# Patient Record
Sex: Male | Born: 1940 | Race: Black or African American | Hispanic: No | Marital: Married | Smoking: Former smoker
Health system: Southern US, Community
[De-identification: ages and names within clinical notes are randomized; demographics above are authoritative.]

## PROBLEM LIST (undated history)

## (undated) DIAGNOSIS — E785 Hyperlipidemia, unspecified: Secondary | ICD-10-CM

## (undated) DIAGNOSIS — C61 Malignant neoplasm of prostate: Secondary | ICD-10-CM

## (undated) DIAGNOSIS — M1990 Unspecified osteoarthritis, unspecified site: Secondary | ICD-10-CM

## (undated) DIAGNOSIS — I714 Abdominal aortic aneurysm, without rupture, unspecified: Secondary | ICD-10-CM

## (undated) DIAGNOSIS — I219 Acute myocardial infarction, unspecified: Secondary | ICD-10-CM

## (undated) DIAGNOSIS — I2510 Atherosclerotic heart disease of native coronary artery without angina pectoris: Secondary | ICD-10-CM

## (undated) DIAGNOSIS — I10 Essential (primary) hypertension: Secondary | ICD-10-CM

## (undated) DIAGNOSIS — E039 Hypothyroidism, unspecified: Secondary | ICD-10-CM

## (undated) HISTORY — PX: ABDOMINAL EXPLORATION SURGERY: SHX538

## (undated) HISTORY — DX: Abdominal aortic aneurysm, without rupture, unspecified: I71.40

## (undated) HISTORY — DX: Essential (primary) hypertension: I10

## (undated) HISTORY — DX: Acute myocardial infarction, unspecified: I21.9

## (undated) HISTORY — DX: Abdominal aortic aneurysm, without rupture: I71.4

## (undated) HISTORY — DX: Malignant neoplasm of prostate: C61

## (undated) HISTORY — PX: HERNIA REPAIR: SHX51

## (undated) HISTORY — PX: CATARACT EXTRACTION W/ INTRAOCULAR LENS  IMPLANT, BILATERAL: SHX1307

## (undated) HISTORY — DX: Hypothyroidism, unspecified: E03.9

## (undated) HISTORY — DX: Hyperlipidemia, unspecified: E78.5

## (undated) HISTORY — PX: CORONARY ANGIOPLASTY: SHX604

---

## 1998-09-03 ENCOUNTER — Emergency Department (HOSPITAL_COMMUNITY): Admission: EM | Admit: 1998-09-03 | Discharge: 1998-09-03 | Payer: Self-pay | Admitting: Emergency Medicine

## 1999-11-22 ENCOUNTER — Inpatient Hospital Stay (HOSPITAL_COMMUNITY): Admission: EM | Admit: 1999-11-22 | Discharge: 1999-11-26 | Payer: Self-pay | Admitting: Emergency Medicine

## 1999-11-22 ENCOUNTER — Encounter: Payer: Self-pay | Admitting: Emergency Medicine

## 1999-11-24 ENCOUNTER — Encounter: Payer: Self-pay | Admitting: Cardiology

## 1999-11-29 ENCOUNTER — Encounter: Admission: RE | Admit: 1999-11-29 | Discharge: 1999-11-29 | Payer: Self-pay | Admitting: Family Medicine

## 1999-12-03 ENCOUNTER — Encounter (HOSPITAL_COMMUNITY): Admission: RE | Admit: 1999-12-03 | Discharge: 2000-03-02 | Payer: Self-pay | Admitting: Cardiology

## 1999-12-04 ENCOUNTER — Encounter: Admission: RE | Admit: 1999-12-04 | Discharge: 1999-12-04 | Payer: Self-pay | Admitting: Family Medicine

## 2000-03-06 ENCOUNTER — Encounter (HOSPITAL_COMMUNITY): Admission: RE | Admit: 2000-03-06 | Discharge: 2000-06-04 | Payer: Self-pay | Admitting: Cardiology

## 2000-05-08 ENCOUNTER — Encounter: Payer: Self-pay | Admitting: Cardiology

## 2000-05-08 ENCOUNTER — Ambulatory Visit (HOSPITAL_COMMUNITY): Admission: RE | Admit: 2000-05-08 | Discharge: 2000-05-08 | Payer: Self-pay | Admitting: Cardiology

## 2000-05-11 ENCOUNTER — Encounter: Admission: RE | Admit: 2000-05-11 | Discharge: 2000-05-11 | Payer: Self-pay | Admitting: Family Medicine

## 2000-05-12 ENCOUNTER — Ambulatory Visit (HOSPITAL_COMMUNITY): Admission: RE | Admit: 2000-05-12 | Discharge: 2000-05-13 | Payer: Self-pay | Admitting: Cardiology

## 2001-06-02 ENCOUNTER — Ambulatory Visit (HOSPITAL_COMMUNITY): Admission: RE | Admit: 2001-06-02 | Discharge: 2001-06-02 | Payer: Self-pay | Admitting: Gastroenterology

## 2001-06-02 ENCOUNTER — Encounter (INDEPENDENT_AMBULATORY_CARE_PROVIDER_SITE_OTHER): Payer: Self-pay | Admitting: *Deleted

## 2004-10-06 HISTORY — PX: COLON SURGERY: SHX602

## 2004-10-06 HISTORY — PX: COLONOSCOPY: SHX174

## 2004-12-09 ENCOUNTER — Encounter: Admission: RE | Admit: 2004-12-09 | Discharge: 2004-12-09 | Payer: Self-pay | Admitting: Internal Medicine

## 2004-12-18 ENCOUNTER — Encounter: Admission: RE | Admit: 2004-12-18 | Discharge: 2004-12-18 | Payer: Self-pay | Admitting: Internal Medicine

## 2005-06-02 ENCOUNTER — Ambulatory Visit (HOSPITAL_COMMUNITY): Admission: RE | Admit: 2005-06-02 | Discharge: 2005-06-02 | Payer: Self-pay | Admitting: Cardiology

## 2006-10-06 DIAGNOSIS — I219 Acute myocardial infarction, unspecified: Secondary | ICD-10-CM

## 2006-10-06 HISTORY — PX: CORONARY ANGIOPLASTY WITH STENT PLACEMENT: SHX49

## 2006-10-06 HISTORY — DX: Acute myocardial infarction, unspecified: I21.9

## 2007-11-05 ENCOUNTER — Encounter (INDEPENDENT_AMBULATORY_CARE_PROVIDER_SITE_OTHER): Payer: Self-pay | Admitting: General Surgery

## 2007-11-05 ENCOUNTER — Inpatient Hospital Stay (HOSPITAL_COMMUNITY): Admission: EM | Admit: 2007-11-05 | Discharge: 2007-11-16 | Payer: Self-pay | Admitting: Emergency Medicine

## 2008-01-04 ENCOUNTER — Ambulatory Visit (HOSPITAL_COMMUNITY): Admission: RE | Admit: 2008-01-04 | Discharge: 2008-01-04 | Payer: Self-pay | Admitting: General Surgery

## 2008-06-23 ENCOUNTER — Inpatient Hospital Stay (HOSPITAL_COMMUNITY): Admission: RE | Admit: 2008-06-23 | Discharge: 2008-06-30 | Payer: Self-pay | Admitting: General Surgery

## 2009-05-29 ENCOUNTER — Encounter: Admission: RE | Admit: 2009-05-29 | Discharge: 2009-05-29 | Payer: Self-pay | Admitting: Internal Medicine

## 2010-10-16 ENCOUNTER — Ambulatory Visit
Admission: RE | Admit: 2010-10-16 | Discharge: 2010-10-16 | Payer: Self-pay | Source: Home / Self Care | Attending: Pulmonary Disease | Admitting: Pulmonary Disease

## 2010-10-16 DIAGNOSIS — E785 Hyperlipidemia, unspecified: Secondary | ICD-10-CM | POA: Insufficient documentation

## 2010-10-16 DIAGNOSIS — I10 Essential (primary) hypertension: Secondary | ICD-10-CM | POA: Insufficient documentation

## 2010-10-16 DIAGNOSIS — G4710 Hypersomnia, unspecified: Secondary | ICD-10-CM | POA: Insufficient documentation

## 2010-10-16 DIAGNOSIS — I219 Acute myocardial infarction, unspecified: Secondary | ICD-10-CM | POA: Insufficient documentation

## 2010-11-07 NOTE — Assessment & Plan Note (Signed)
Summary: SLEEP CONSULT /SH   Primary Provider/Referring Provider:  Dr. Rinaldo Cloud  CC:  Sleep evaluation...epworth score is 4.  History of Present Illness: 70 yo male for sleep evaluation.  His wife has sleep apnea.  She is concerned he may have sleep apnea aslo.  He snores, and she sees him stop breathing while asleep.  He will also wake up with a cough, and more so when on his back.  This has been present for some time.  He goes to bed at 11pm, and falls asleep quick.  He does not use anything to help him sleep.  He wakes up several times to use the bathroom.  He gets up at 8am and feels okay.  He denies morning headache.  He drinks 2 cups of coffee in the morning.  He works out at [employer] for up to 2 hours per day.  He denies sleep walking, sleep talking, nightmares, or bruxism.  There is no history of restless legs, but he does occasional get cramps.  He denies sleep hallucinations, sleep paralysis, or cataplexy.  He quit smoking years ago.  He does not drink much alcohol.  He has not had any trouble with his mood.  He has gained about 15 lbs.    Preventive Screening-Counseling & Management  Alcohol-Tobacco     Smoking Status: quit     Packs/Day: 1.0     Year Started: 1971     Year Quit: 2001  Current Medications (verified): 1)  Metoprolol Succinate 100 Mg Xr24h-Tab (Metoprolol Succinate) .Marland Kitchen.. 1 By Mouth Daily 2)  Levothyroxine Sodium 50 Mcg Tabs (Levothyroxine Sodium) .Marland Kitchen.. 1 By Mouth Daily 3)  Losartan Potassium 100 Mg Tabs (Losartan Potassium) .Marland Kitchen.. 1 By Mouth Daily 4)  Simvastatin 80 Mg Tabs (Simvastatin) .Marland Kitchen.. 1 By Mouth At Bedtime 5)  Hydrochlorothiazide 25 Mg Tabs (Hydrochlorothiazide) .Marland Kitchen.. 1 By Mouth Daily 6)  Amlodipine Besylate 5 Mg Tabs (Amlodipine Besylate) .Marland Kitchen.. 1 By Mouth Daily  Allergies (verified): No Known Drug Allergies  Past History:  Past Medical History: CAD s/p MI s/p stenting Hypertension Hyperlipidemia Hypothyroidism  Past Surgical  History: Aortic insuff-mod -, Cardiac Cath/PTCA stent LAD--EF 38% - 06/06/2000, multiple PTCA`s `97-`01 -  colon repair after colonoscopy in 2008 heart stent in2000  Family History: Reviewed history and no changes required. Family History MI/Heart Attack---sister Heart disease---2 brothers  Social History: Reviewed history from 12/03/2006 and no changes required. Married, h/o tob abuse, quit 01/01 Patient states former smoker.  retired truck driverSmoking Status:  quit Packs/Day:  1.0  Review of Systems       The patient complains of shortness of breath with activity, non-productive cough, and hand/feet swelling.  The patient denies shortness of breath at rest, productive cough, coughing up blood, chest pain, irregular heartbeats, acid heartburn, indigestion, loss of appetite, weight change, abdominal pain, difficulty swallowing, sore throat, tooth/dental problems, headaches, nasal congestion/difficulty breathing through nose, sneezing, itching, ear ache, anxiety, depression, joint stiffness or pain, rash, change in color of mucus, and fever.    Vital Signs:  Patient profile:   70 year old male Height:      66 inches (167.64 cm) Weight:      231 pounds (105.00 kg) BMI:     37.42 O2 Sat:      94 % on Room air Temp:     97.8 degrees F (36.56 degrees C) oral Pulse rate:   86 / minute BP sitting:   138 / 72  (left arm) Cuff size:  large  Vitals Entered By: Michel Bickers CMA (October 16, 2010 9:19 AM)  O2 Sat at Rest %:  94 O2 Flow:  Room air CC: Sleep evaluation...epworth score is 4 Is Patient Diabetic? No Comments Medications reviewed with patient Michel Bickers CMA  October 16, 2010 9:20 AM   Physical Exam  General:  normal appearance, healthy appearing, and obese.   Eyes:  PERRLA and EOMI, wears glasses Nose:  no deformity, discharge, inflammation, or lesions Mouth:  MP 3, wears dentures Neck:  no masses, thyromegaly, or abnormal cervical nodes Chest Wall:  no deformities  noted Lungs:  clear bilaterally to auscultation and percussion Heart:  regular rate and rhythm, S1, S2 without murmurs, rubs, gallops, or clicks Abdomen:  bowel sounds positive; abdomen soft and non-tender without masses, or organomegaly Msk:  no deformity or scoliosis noted with normal posture Pulses:  pulses normal Extremities:  no clubbing, cyanosis, edema, or deformity noted Neurologic:  normal CN II-XII and strength normal.   Cervical Nodes:  no significant adenopathy Psych:  alert and cooperative; normal mood and affect; normal attention span and concentration   Impression & Recommendations:  Problem # 1:  HYPERSOMNIA (ICD-780.54) He has sleep disruption and witnessed apnea.  He has history of cardiovascular disease.  I am concerned that he has sleep apnea. To further assess will schedule sleep test.  Discussed how sleep apnea can affect his health.  Driving precautions and importance or weight loss discussed.  Medications Added to Medication List This Visit: 1)  Metoprolol Succinate 100 Mg Xr24h-tab (Metoprolol succinate) .Marland Kitchen.. 1 by mouth daily 2)  Levothyroxine Sodium 50 Mcg Tabs (Levothyroxine sodium) .Marland Kitchen.. 1 by mouth daily 3)  Losartan Potassium 100 Mg Tabs (Losartan potassium) .Marland Kitchen.. 1 by mouth daily 4)  Simvastatin 80 Mg Tabs (Simvastatin) .Marland Kitchen.. 1 by mouth at bedtime 5)  Hydrochlorothiazide 25 Mg Tabs (Hydrochlorothiazide) .Marland Kitchen.. 1 by mouth daily 6)  Amlodipine Besylate 5 Mg Tabs (Amlodipine besylate) .Marland Kitchen.. 1 by mouth daily 7)  Aspirin 81 Mg Chew (Aspirin) .... One once daily  Complete Medication List: 1)  Metoprolol Succinate 100 Mg Xr24h-tab (Metoprolol succinate) .Marland Kitchen.. 1 by mouth daily 2)  Levothyroxine Sodium 50 Mcg Tabs (Levothyroxine sodium) .Marland Kitchen.. 1 by mouth daily 3)  Losartan Potassium 100 Mg Tabs (Losartan potassium) .Marland Kitchen.. 1 by mouth daily 4)  Simvastatin 80 Mg Tabs (Simvastatin) .Marland Kitchen.. 1 by mouth at bedtime 5)  Hydrochlorothiazide 25 Mg Tabs (Hydrochlorothiazide) .Marland Kitchen.. 1 by  mouth daily 6)  Amlodipine Besylate 5 Mg Tabs (Amlodipine besylate) .Marland Kitchen.. 1 by mouth daily 7)  Aspirin 81 Mg Chew (Aspirin) .... One once daily  Other Orders: Consultation Level IV (45409) Sleep Study (Sleep Study)  Patient Instructions: 1)  Will schedule sleep test 2)  Will call to schedule follow up after sleep test reviewed

## 2011-02-18 NOTE — Op Note (Signed)
Bryan Carter, Bryan Carter                ACCOUNT NO.:  [id number]   MEDICAL RECORD NO.:  [id number]          PATIENT TYPE:  INP   LOCATION:  3305                         FACILITY:  [facility]   PHYSICIAN:  Ollen Gross. Vernell Morgans, M.D. DATE OF BIRTH:  07/20/1941   DATE OF PROCEDURE:  11/08/2007  DATE OF DISCHARGE:                               OPERATIVE REPORT   PREOPERATIVE DIAGNOSIS:  Colon perforation from colonoscopy.   POSTOPERATIVE DIAGNOSIS:  Colon perforation from colonoscopy.   PROCEDURE:  Exploratory laparotomy, low anterior resection, repair of  serosal tear of the left colon, lysis of adhesions.   SURGEON:  Ollen Gross. Vernell Morgans, M.D.   ASSISTANT:  Dr. Janee Morn.   ANESTHESIA:  General endotracheal.   PROCEDURE:  After informed consent was obtained, the patient was brought  to the operating room and placed in the supine position on the operating  room table.  After adequate induction of general anesthesia, the  patient's abdomen was prepped with Betadine and draped in usual sterile  manner.  The patient also was placed in lithotomy position.  A midline  incision was made with 10 blade knife.  This incision was carried down  through the skin and subcutaneous tissue sharply with electrocautery  until the linea alba was identified.  The linea alba was incised with  the electrocautery.  The preperitoneal space then probed bluntly with  hemostat until the peritoneum was opened and access was gained to the  abdominal cavity.  The rest of the incision was opened under direct  vision with the electrocautery.  There was a large amount of free intra-  abdominal air.  The patient was placed in Trendelenburg position, his  small bowel was packed in the upper abdomen.  A Balfour retractor was  deployed.  The sigmoid and left colon were examined, the patient had a  large tear of his rectosigmoid right above the peritoneal reflection.  Going more proximal up the sigmoid and left colon, there were  several  other areas of serosal tear.  The sigmoid and left colon also had some  unusual adhesions of the appendages to the mesentery and retroperitoneum  that tethered the bowel down and made it very nonmobile, which probably  contributed to the perforation.  These adhesions of the appendages to  the mesentery and retroperitoneum were divided sharply with the  electrocautery.  This allowed [place] to free up the left colon.  The left  colon was also mobilized by incising its retroperitoneal attachment  along the white line of Toldt.  We were then able to examine the left  and sigmoid colon much better.  There were several serosal tears just  above the large full-thickness tear low on the sigmoid, rectosigmoid  area.  A site was chosen above these areas of tear for division of the  colon.  The mesentery at this point was opened sharply with the  electrocautery.  The GIA 75 stapler was placed across the colon at this  point, clamped and fired thereby dividing the colon between staple  lines.  The mesentery to the rectosigmoid was taken  down with the  LigaSure until we were just below the level of the tear.  The  rectosigmoid was then divided with a blue contour stapler just below the  level of the tear.  Once this was accomplished, the area of tear was  removed and sent to pathology for further evaluation.  More proximal on  the left colon, there was one smaller area of serosal tear with no  perforation and this was repaired with interrupted 3-0 silk stitches.  The rest of colon was examined and no other areas of tear or damage were  identified.  The sigmoid colon would easily reach down to the rectal  stump.  Since the colon was already prepped and was minimal  contamination, we decided to go ahead and make our primary anastomosis.  Because of the low nature of this, we decided EEA stapled anastomosis  will be best.  The staple line of the proximal colon was excised sharply  with the  electrocautery.  EEA sizers were used and a 29 mm sizer  appeared to be the appropriate size.  A 2-0 Prolene pursestring stitch  was then placed around the edge of the proximal colon.  This was made in  a running fashion.  The anvil of the stapler was then placed inside the  proximal colon and the Prolene pursestring stitch with cinched down snug  around the stem of the anvil.  At this point, Dr. Janee Morn went down to  the rectum and was able to bring the EEA stapler up to the staple line.  The spike was then deployed just anterior to the staple line.  The anvil  was able to be attached to the spike without difficulty, making sure  that there were no twists to the colon.  The stapling device was then  closed until the indicator was in the middle of the green section and  then fired, thereby creating a nice patent anastomosis.  The stapling  device was then gently opened and removed without difficulty.  The  proximal colon was clamped with curved soft bowel clamp.  A rigid  sigmoidoscope was placed just inside the rectum and the rectum was  insufflated with saline in the pelvis and no evidence of leak or air  bubbles could be appreciated, the anastomosis looked good.  The air was  then evacuated.  The abdomen was irrigated with copious amounts of  saline.  The fascia of the anterior abdominal wall was then closed at  this point with the two running #1 looped PDS sutures.  The subcutaneous  tissue was irrigated with copious amounts of saline and Betadine and the  skin was closed with staples.  Sterile dressings were applied.  The  patient tolerated the procedure well.  At the end of the case, all  needle, sponge and instrument counts were correct.  The patient was then  awakened and taken to recovery in stable condition.      Ollen Gross. Vernell Morgans, M.D.  Electronically Signed     PST/MEDQ  D:  11/08/2007  T:  11/08/2007  Job:  [id number]

## 2011-02-18 NOTE — Discharge Summary (Signed)
Bryan Carter, Bryan Carter                ACCOUNT NO.:  [id number]   MEDICAL RECORD NO.:  [id number]          PATIENT TYPE:  INP   LOCATION:  2039                         FACILITY:  [facility]   PHYSICIAN:  Maisie Fus A. Cornett, M.D.DATE OF BIRTH:  [birth year]   DATE OF ADMISSION:  11/05/2007  DATE OF DISCHARGE:  11/16/2007                               DISCHARGE SUMMARY   ADMITTING DIAGNOSES:  1. Perforated colon status post colonoscopy.  2. Gastroesophageal reflux disease.  3. History of hypothyroidism.  4. Dyslipidemia.  5. Hypertension.  6. Alcohol use.  7. Colon polyps.  8. Anxiety.   DISCHARGE DIAGNOSES:  1. Perforated colon status post colonoscopy.  2. Gastroesophageal reflux disease.  3. History of hypothyroidism.  4. Dyslipidemia.  5. Hypertension.  6. Alcohol use.  7. Colon polyps.  8. Anxiety.   PROCEDURE PERFORMED:  Exploratory laparotomy with low anterior resection  secondary to colonic perforation and repair of serosal tear of left  colon with lysis of adhesions by Dr. Carolynne Edouard.   BRIEF HISTORY:  The patient is a 70 year old male who underwent a  colonoscopy on November 05, 2007, by Dr. Loreta Ave.  He developed abdominal  pain and distention and was found to have a perforated viscus and  brought to [facility] for emergency surgery.   HOSPITAL COURSE:  The patient underwent successful procedure on November 08, 2007.  Please see the op note for the details.  Postop course was  complicated by an ileus.  This slowly resolved over the next 7 days.  He  began to pass flatus and his bowel function returned.  He was having  bowel movements by postoperative day #8.  His diet was advanced.  His  pain was controlled.  Staples were removed on postop day #8.  The wound  was clean, dry and intact.  The abdomen was soft, obese.  He is having  no other issues at this point in time.  He was discharged home in  improved condition.   DISCHARGE INSTRUCTIONS:  He will follow up in 2-3 weeks with Dr.  Carolynne Edouard.  He will be on a soft diet.  He will refrain from driving, lifting,  pushing, pulling for 2-3 weeks.  He will take his home medicines as  listed on his medical reconciliation sheet which include the following:   DISCHARGE MEDICATIONS:  1. Norvasc 5 mg daily.  2. Toprol XL 50 mg daily.  3. Synthroid 50 mg daily.  4. Avalide 300/12.5 daily.  5. Aspirin 81 mg daily.  6. Simvastatin 80 mg daily.  7. Protonix 40 mg daily.  8. Vicodin ES 1-2 tablets q.4 h. p.r.n. pain.   FOLLOWUP:  He will follow up with Dr. Carolynne Edouard in 2-3 weeks.  He will  shower.   CONDITION AT DISCHARGE:  Improved.      [name], M.D.  Electronically Signed     TAC/MEDQ  D:  11/16/2007  T:  11/17/2007  Job:  [id number]   cc:   Anselmo Rod, M.D.
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
## 2011-02-18 NOTE — Op Note (Signed)
NAMEKAVI, ALMQUIST                ACCOUNT NO.:  [id number]   MEDICAL RECORD NO.:  [id number]          PATIENT TYPE:  OIB   LOCATION:  5151                         FACILITY:  [facility]   PHYSICIAN:  Ollen Gross. Vernell Morgans, M.D. DATE OF BIRTH:  06-25-1941   DATE OF PROCEDURE:  06/23/2008  DATE OF DISCHARGE:                               OPERATIVE REPORT   PREOPERATIVE DIAGNOSIS:  Ventral hernia.   POSTOPERATIVE DIAGNOSIS:  Ventral hernia.   PROCEDURE:  Ventral hernia repair with mesh and lysis of adhesions.   SURGEON:  Ollen Gross. Vernell Morgans, MD   ASSISTANT:  Juanetta Gosling, MD   ANESTHESIA:  General endotracheal.   PROCEDURE:  After informed consent was obtained, the patient was brought  to the operating room and placed in supine position on the operating  table.  After adequate induction of general anesthesia, the patient's  abdomen was prepped with Betadine and draped in usual sterile manner.  A  midline incision was made with a 10 blade knife.  This incision was  carried down through the skin and subcutaneous tissue sharply with the  electrocautery.  The hernia sac was then able to be identified.  The  hernia sac was opened sharply with the electrocautery and the rest of  the incision was then opened under direct vision all with  electrocautery.  There were some omental adhesions to the anterior  abdominal wall and these were taken down sharply with the electrocautery  until the abdominal wall was completely free.  Bowel appeared to be  healthy.  Next, the fascia was identified circumferentially around the  defect.  The fascia was grasped with Kocher clamps and then subcutaneous  flaps were created on top of the anterior abdominal wall fascia.  This  was all done circumferentially and sharply with electrocautery to  mobilize the fascia towards the midline.  Once this was accomplished, we  then measured the hernia defect which measured about 10 x 17.  We chose  the 20 x 25 piece of  Proceed mesh.  We anchored the mesh intra-  abdominally with the mesh oriented with the blue side towards the skin  with interrupted #1 Novafil stitches that were brought through the  abdominal wall, then through the mesh, and then back through the  abdominal wall in a U-type stitch.  This was done all the way around the  edge of the fascia making sure there were no gaps.  Once these stitches  were all placed circumferentially around the hernia defect, all of the  stitches were held up and care was taken to make sure no bowel or  omentum was caught within the stitches.  All the stitches were then held  under tension.  Once we were confirmed that the mesh was free, each  stitch was tied down and divided.  Once this was accomplished, the mesh  was in very good position without any redundancy.  The wound was then  irrigated with copious amounts of saline.  The fascia of the anterior  abdominal wall was then closed with 2 running #1 looped double-stranded  PDS sutures.  The subcutaneous tissue was irrigated with copious amounts  of saline.  A small stab incision was made in the right lower quadrant.  A hemostat was placed through this incision into the subcutaneous space.  A 19-French, round, Blake drain was then brought into the wound and  placed along the anterior abdominal wall fascia.  This drain was  anchored to the skin with a 3-0 nylon stitch, so the hernia sac near the  skin was then excised sharply with electrocautery as well as some of the  skin scar.  The subcutaneous tissue was then closed with running 2-0  Vicryl stitch and the skin was closed  with staples.  The drain was placed to bulb suction.  Sterile dressings  were applied.  The patient tolerated the procedure well.  At the end of  the case, all needle, sponge, and instrument counts were correct.  The  patient was then awakened and taken to recovery room in stable  condition.      Ollen Gross. Vernell Morgans, M.D.  Electronically  Signed     PST/MEDQ  D:  06/23/2008  T:  06/23/2008  Job:  [id number]
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
## 2011-02-21 NOTE — Discharge Summary (Signed)
Bryan Carter, Bryan Carter                ACCOUNT NO.:  [id number]   MEDICAL RECORD NO.:  [id number]          PATIENT TYPE:  INP   LOCATION:  5155                         FACILITY:  [facility]   PHYSICIAN:  Ollen Gross. Vernell Morgans, M.D. DATE OF BIRTH:  [birth year]   DATE OF ADMISSION:  06/23/2008  DATE OF DISCHARGE:  06/30/2008                               DISCHARGE SUMMARY   HISTORY AND HOSPITAL COURSE:  [name] is a 70 year old gentleman who  was brought to the operating room on June 23, 2008 for ventral  hernia repair with mesh.  He tolerated the surgery well.  A JP drain was  left in the subcutaneous tissue postoperatively.  He was continued on  clear liquids postoperatively until his bowel function returned.  He had  some difficulty breathing on postop day #1 and a chest x-ray was  obtained but was fairly unremarkable.  On postop day #3, Reglan was  added for postop ileus and albuterol nebs were added for some wheezing.  His abdomen was still distended and quiet.  He received a Dulcolax  suppository on postop day #4.  His pulmonary function improved on postop  day #4.  On postop day #5, we were able to advance his diet and on  June 30, 2008, he was tolerating a diet.  Bowel function had  returned and he was doing well and was ready for discharge home.   MEDICATIONS:  He was to resume his home meds.  He was given a  prescription for pain medicine.   ACTIVITIES:  No heavy lifting.   DIET:  As tolerated.   FINAL DIAGNOSIS:  Ventral hernia repair with mesh.   FOLLOWUP:  With Dr. Carolynne Edouard in a week.   DISPOSITION:  He is discharged home.      Ollen Gross. Vernell Morgans, M.D.  Electronically Signed     PST/MEDQ  D:  08/14/2008  T:  08/15/2008  Job:  [id number]

## 2011-02-21 NOTE — Cardiovascular Report (Signed)
[place]. [facility]  Patient:    Bryan Carter, Bryan Carter                       MRN: [id number] Proc. Date: 05/12/00 Adm. Date:  [id number] Disc. Date: [id number] Attending:  Robynn Pane CC:         Cath lab  [facility]. Sharyn Lull, M.D.   Cardiac Catheterization  PREOPERATIVE DIAGNOSIS:  POSTOPERATIVE DIAGNOSIS:  OPERATION/PROCEDURE:          1. Left cardiac catheterization with selective left and right coronary angiography, left ventriculography right groin using Judkins technique.                                2. Successful PTCA to proximal LAD using 3.0 x 10 mm long cutting balloon.  ANESTHESIA:                   Xylocaine 2% local anesthesia.  SURGEON:                      Eduardo Osier. Sharyn Lull, M.D.  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  He complained of retrosternal chest burning off and on lasting a few minutes associated with minimal exertion and relieved with rest. The patient states he feels weak and tired, denies any nausea or vomiting, or diaphoresis.  No rest or nocturnal angina.  The patient underwent stress Cardiolite on May 08, 2000, which showed marked reversible ischemia involving the anterior wall and inferior wall and apex, less so in the septum with superimposed scarring of the apex with ejection fraction of 36%.  Due to recurrent chest pain and positive stress Cardiolite suggestive of a large area of myocardium at risk, the patient was advised of left possible angioplasty.  DESCRIPTION OF PROCEDURE:     After obtaining the informed consent, the patient was brought to the catheterization lab and was placed on the fluoroscopic table. The right groin was prepped and draped in the  usual fashion.  Then 2% Xylocaine was used for local anesthesia in the right groin. The ___________ thin wall needle and 6 French arterial sheath was placed. The sheath was aspirated and flushed.  Next, a 6 French left Judkins catheter was advanced over the wire under fluoroscopic guidance up to the ascending aorta. Wire was pulled out. The catheter was aspirated and connected to the manifold.  The catheter was further advanced and engaged into the left coronary ostium where full views of the left system were done.  Next, the catheter was disengaged and was pulled out over the wire and was replaced with 6 French right Judkins catheter which was advanced over the wire under fluoroscopic guidance to the ascending aorta.  Wire was pulled out. The catheter was aspirated and connected to the manifold.  The catheter was further advanced and engaged into right coronary ostium and full views of the right system were taken.  Next, catheter was disengaged and was pulled out over the wire and was replaced with 6 French pigtail catheter which was advanced over the wire under fluoroscopic guidance up to the ascending aorta. Wire was pulled out.  The catheter  was aspirated and connected to the manifold.  Catheter was further advanced across the aortic valve into the LV. LV pressures were recorded.  Next, left ventriculography was done in the ROA position.  Post angiography pressures were recorded from LV and then pullback pressures were recorded from the aorta.  There was no gradient across the aortic valve.  Next, the pigtail catheter was pulled out.  The sheaths were aspirated and flushed.  FINDINGS:                     The patient has anterior apical wall hypokinesia with EF of 40 to 45%, left main was short which was patent, LAD has 99% proximal in-stent restenosis, left circumflex has 15 to 20% proximal stenosis which is a small vessel and tapers down into the AV groove.  The OM1 is medium size  which has 15 to 20% proximal stenosis.  The OM2 and OM3 are very small, which are patent.  Dara Lords has 10 to 15% mid stenosis.  PDA has 70 to 75% stenosis; vessel is small and is less than 1.5 mm in size.  INTERVENTIONAL PROCEDURE:     Successful PTCA to proximal LAD was done using 3.0 x 10 mm long cutting balloon.  Multiple inflations were done.  Lesion dilated from 99% to less than 10% ostial with excellent grade III distal flow without evidence of dissection or distal embolization.  The patient received __________ heparin, ReoPro, and Plavix during the procedure.  The patient tolerated the procedure well.  There were no complications.  The patient was transferred to __________________ unit in stable condition. DD:  05/13/00 TD:  05/14/00 Job: [id number]

## 2011-02-21 NOTE — Cardiovascular Report (Signed)
[place]. [facility]  Patient:    JASMAN, MURRI                       MRN: [id number] Proc. Date: 11/25/99 Adm. Date:  [id number] Disc. Date: [id number] Attending:  Asencion Partridge L CC:         Cardiac Catheterization Laboratory                        Cardiac Catheterization  PROCEDURES: 1. Left cardiac catheterization with selective right and left coronary angiography,    left ventriculography via right groin using Judkins technique. 2. Successful percutaneous transluminal coronary angioplasty to proximal left    anterior descending using 2.5 x 15 mm long CrossSail balloon. 3. Successful deployment of 3.0 x 12 mm long AVE S670 stent in proximal    left anterior descending at the bifurcation with diagonal #1.  INDICATIONS FOR PROCEDURE:  Mr. Ambroise is a 70 year old black male with a past medical history significant for hypertension, tobacco abuse, positive family history of coronary artery disease.  He came to the ER complaining of retrosternal chest pressure radiating to the back associated with diaphoresis and mild shortness of breath and grade 10/10 while he was in shop.  He went home and took two aspirin with partial relief.  He came to the ER, refused sublingual nitroglycerin and was started on heparin and nitrates with relief of chest pain.  Denies palpitation,  lightheadedness, syncope.  Denies PND, orthopnea, leg swelling.  Denies fever, chills, cough and denies relation of chest pain to movement or breathing. Denies any sort exertional angina in the past.  ECG done in the ER showed normal sinus rhythm with LVH with occasional PVCs, Q and aVL with minimal ST elevation in aVL.  Poor R wave progression in V1 to V3 with minimal ST elevation in V1 to V3. His first set of cardiac enzymes was elevated.  CPK was 295, MB of 10.3. Troponin was also slightly elevated at 0.2.  Presently, the patient is pain-free and on V nitrates, heparin, aspirin, and  beta blockers.  The next day on February 17, the patients enzymes were further elevated.  His CPK was 450, MB of 50.1 with relative index of 11.1 and third set CPK was 447, MB of 43.5, and relative index of 9.7.  Troponin I second set was 2.25 and third set was 1.59.  ECG also showed new changes suggestive of an evolving anteroseptal wall MI. Due to multiple risks factors, positive cardiac enzymes and new ECG changes, the patient was advised for possible angioplasty.  DESCRIPTION OF PROCEDURE:  After obtaining informed consent, the patient was brought to the catheterization lab and was placed on the fluoroscopy table. The right groin was prepped and draped in the usual fashion.  Xylocaine 2% was used for local anesthesia in the right groin.  With the help of a thin-walled needle, a  [place] arterial sheath were placed.  The sheath was aspirated and flushed. Next, a straight left Judkins catheter was advanced over the wire under fluoroscopic guidance up to the ascending aorta.  The wire was pulled out.  The catheter was  aspirated and connected to the manifold.  The catheter was further advanced and  engaged into the left coronary ostium.  Multiple views of the left system were taken.  Next, the catheter was disengaged and was pulled out over the wire and as replaced with a 6 [place] right  Judkins catheter, which was advanced over the wire under fluoroscopic guidance up the ascending aorta.  The wire was pulled out. he catheter was aspirated and connected to the manifold.  The catheter was further  advanced and engaged into the right coronary ostium.  Multiple views of the right system were taken.  Next, the catheter was disengaged and was pulled out over the wire and was replaced with a 6 French pigtail catheter which was advanced over he wire under fluoroscopic guidance up to the ascending aorta.  The wire was pulled out.  The catheter was aspirated and connected to the  manifold.  The catheter was further advanced across the aortic valve into the LV.  LV pressures were recorded. Next, LV graft was done in 30 degree RAO position.  Post angiographic pressures  were recorded from LV and then pullback pressures were recorded from the aorta.  There was no gradient across the aortic valve.  The pigtail catheter was pulled out over the wire.  Sheaths were aspirated and flushed.  FINDINGS:  LEFT VENTRICULOGRAM:  The patient has mild anterolateral wall hypokinesia.  EF f approximately 45-50%.  The left main was short but was patent.  The LAD has 95% proximal stenosis with haziness at the bifurcation of the diagonal #1.  Diagonal #1 is very small, 1.25 to 1.5 mm.  The left circumflex is patent and tapers down in the AV groove after OM-2. OM-1 is moderate sized and is patent.  OM-2 is small and is patent.  The RCA is patent.  INTERVENTIONAL PROCEDURE:  Successful PTCA to proximal the LAD was done using a  2.5 x 15 mm long CrossSail balloon using double wire technique to protect diagonal #1 and then for predilatation and then 3.0 x 12 mm long AVE S670 stent was deployed at 8 atmospheres of pressure which was fully expanded going up to atmospheres pressure.  The lesion was dilated from 95% to 0% ostial with excellent TIMI-3 distal flow without evidence of dissection of disembolization.  The patient tolerated the procedure well.  There were no complications.  The arteriotomy was closed with Perclose after the procedure without complications.  The patient received weight based heparin, ReoPro, and Plavix during the procedure.  The patient was transferred to the recovery room in stable condition for hemodynamic monitoring and anticoagulation. DD:  11/25/99 TD:  11/25/99 Job: [id number]

## 2011-02-21 NOTE — Discharge Summary (Signed)
[place]. [facility]  Patient:    Bryan Carter                       MRN: [id number] Adm. Date:  [id number] Disc. Date: [id number] Attending:  Robynn Pane CC:         Eduardo Osier. Sharyn Lull, M.D.   Discharge Summary  ADMISSION DIAGNOSES: 1. Unstable angina, positive stress Cardiolite, status post percutaneous    transluminal coronary angioplasty to in-stent restenosis to proximal left    anterior descending. 2. Hypertension. 3. History of anteroseptal wall myocardial infarction. 4. Hypercholesterolemia. 5. History of tobacco abuse.  DISCHARGE MEDICATIONS: 1. Toprol XL 50 mg 1 tablet twice daily. 2. Altace 10 mg 1 capsule twice daily. 3. Zocor 40 mg 1 tablet p.o. h.s. 4. Enteric-coated aspirin 1 tablet daily. 5. Plavix 75 mg 1 tablet daily with food for one month. 6. Nexium 40 mg 1 capsule daily for one month. 7. Norvasc 5 mg 1 tablet daily. 8. Nitrostat 0.4 mg sublingual use as directed.  ACTIVITY:  Avoid heavy lifting, pushing, or pulling.  DIET:  Low salt/low cholesterol diet.  DISCHARGE INSTRUCTIONS:  Post angioplasty instructions have been given.  FOLLOW-UP:  Follow up with me in seven days.  CONDITION ON DISCHARGE:  Stable.  BRIEF HISTORY:  Mr. Bryan Carter is a 70 year old black male with a past medical history significant for anteroseptal wall MI in February 2001, status post PTCA stenting to proximal LAD, history of hypertension, tobacco abuse, post family history of coronary artery disease, with complaints of retrosternal burning off and on lasting a few minutes associated with minimal exertion and relieved with rest.  He states he feels weak and tired.  He denies any nausea, vomiting, or diaphoresis.  No retrosternal angina.  The patient underwent stress Cardiolite on May 08, 2000, which showed marked reversible ischemia involving the anterior and inferior wall and apex; less so the septum with superimposed _______ of the apex with  EF of 36%.  Due to recurrent chest pain and positive stress Cardiolite with history of large area area of myocardial ischemia, the patient was advised for catheterization and possible angioplasty.  PAST MEDICAL HISTORY:  As above.  PAST SURGICAL HISTORY:  None.  SOCIAL HISTORY:  He is married and has three children.  He worked as a [employer].  He smoked one pack per day for 20+ years.  He quit approximately six months ago.  No history of alcohol or cocaine abuse.  The patient was born and lives in [place].  FAMILY HISTORY:  Father died at the age of 76 and mother died at the age of 2, cause unknown.  One brother had a cerebrovascular accident.  HOME MEDICATIONS 1. Altace 10 mg p.o. b.i.d. 2. Toprol XL 100 mg a.m. and 50 mg p.o. q.p.m. 3. Zocor 40 mg p.o. q.d. 4. Enteric-coated aspirin 325 mg p.o. q.d. 5. Norvasc 10 mg p.o. q.d. 6. Nitrostat sublingual p.r.n.  PHYSICAL EXAMINATION:  GENERAL:  He was alert, awake, and oriented x 3 in no acute distress.  VITAL SIGNS:  Blood pressure 130/80, pulse 64 and regular.  HEENT:  Conjunctiva was pink.  NECK:  Supple.  No JVD, no bruits.  LUNGS:  Clear to auscultation without rhonchi or rales.  CARDIOVASCULAR:  S1, S2 was normal.  There was no S3 or gallop.  There was no murmur.  ABDOMEN:  Soft.  Bowel sounds were present.  Nontender.  EXTREMITIES:  No clubbing, cyanosis,  or edema.  HOSPITAL COURSE:  The patient was admitted and underwent left cardiac catheterization with elective left and right coronary angiography and PTCA to in-stent restenosis in the proximal LAD, as per catheterization and interventional reports.  The patient tolerated the procedure well.  There were no complications.  The patient was transferred to elective angioplasty unit in stable condition.  His right femoral arterial sheath was pulled out yesterday without any problems.  There is no evidence of hematoma or bruits.  The patient has been  ambulating in the hallway without any problems.  Phase I cardiac rehab was called.  The patient ambulated in the hallway this morning without any problems.  The patient feels already stronger and no evidence of chest burning.  The patient has been also scheduled for Phase 2 cardiac rehab as an outpatient.  The patient will be discharged on the above medications and will be followed up in my office in one week.  The patient has been advised for nitroglycerin use, angina, calling 911, risk factors, and exercise guidelines.  The patient understands and states he has not been exercising or eating proper food but he will try to stick to a low cholesterol and low salt diet. DD:  05/13/00 TD:  05/14/00 Job: [id number]

## 2011-02-21 NOTE — Procedures (Signed)
[place]. [facility]  Patient:    Bryan Carter, Bryan Carter Visit Number: [id number] MRN: [id number]          Service Type: Attending:  Anselmo Rod, M.D. Proc. Date: 06/02/01   CC:         Kern Reap, M.D.   Procedure Report  DATE OF BIRTH:  October 14, 1940  REFERRING PHYSICIAN:  Kern Reap, M.D.  PROCEDURE PERFORMED:  Colonoscopy with hot biopsies.  ENDOSCOPIST:  Anselmo Rod, M.D.  INSTRUMENT USED:  Olympus video colonoscope.  INDICATIONS FOR PROCEDURE:  The patient is a 70 year old African-American male with a history of rectal bleeding, rule out colonic polyps, masses, hemorrhoids, etc.  PREPROCEDURE PREPARATION:  Informed consent was procured from the patient. The patient was fasted for eight hours prior to the procedure and prepped with a bottle of magnesium citrate and a gallon of NuLytely the night prior to the procedure.  PREPROCEDURE PHYSICAL:  The patient had stable vital signs.  Neck supple. Chest clear to auscultation.  S1, S2 regular.  Abdomen soft with normal abdominal bowel sounds.  DESCRIPTION OF PROCEDURE:  The patient was placed in the left lateral decubitus position and sedated with 60 mg of Demerol and 6 mg of Versed intravenously.  Once the patient was adequately sedated and maintained on low-flow oxygen and continuous cardiac monitoring, the Olympus video colonoscope was advanced from the rectum to the cecum with slight difficulty secondary to a large amount of residual stool in the colon.  Small internal hemorrhoids appreciated on retroflexion in the rectum.  Four to five sessile polyps were hot biopsied from 15 cm.  There were few scattered diverticula seen.  Very small lesions could have been missed secondary to an inadequate prep.  IMPRESSION: 1. Small nonbleeding internal hemorrhoids. 2. Several small sessile biopsied from 15 cm. 3. Few scattered diverticula throughout the colon. 4. Large amount of  residual stool in the colon, small lesions could have    been missed.  RECOMMENDATIONS: 1. Await pathology results. 2. Outpatient follow-up in the next four weeks. 3. Try Anusol HC 2.5% suppositories for the rectal bleeding and see if    it helps with internal hemorrhoids.  If this does not improve, the patient    may require surgical intervention. 4. High fiber diet with liberal fluid intake.Attending:  Anselmo Rod, M.D.  DD:  06/02/01 TD:  06/03/01 Job: [id number]
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
## 2011-06-27 LAB — BASIC METABOLIC PANEL
BUN: 10
BUN: 10
BUN: 11
BUN: 13
BUN: 13
BUN: 9
CO2: 24
CO2: 25
CO2: 25
CO2: 25
CO2: 25
CO2: 26
Calcium: 9.2
Calcium: 8 — ABNORMAL LOW
Calcium: 8 — ABNORMAL LOW
Calcium: 8.1 — ABNORMAL LOW
Calcium: 8.2 — ABNORMAL LOW
Chloride: 110
Chloride: 105
Chloride: 107
Chloride: 111
Chloride: 117 — ABNORMAL HIGH
Creatinine, Ser: 1.28
Creatinine, Ser: 1.3
Creatinine, Ser: 1.38
Creatinine, Ser: 1.57 — ABNORMAL HIGH
GFR calc Af Amer: >60
GFR calc Af Amer: 54 — ABNORMAL LOW
GFR calc Af Amer: 54 — ABNORMAL LOW
GFR calc Af Amer: 60 — ABNORMAL LOW
GFR calc Af Amer: >60
GFR calc Af Amer: >60
GFR calc Af Amer: >60
GFR calc Af Amer: >60
GFR calc non Af Amer: 51 — ABNORMAL LOW
GFR calc non Af Amer: 60 — ABNORMAL LOW
GFR calc non Af Amer: 44 — ABNORMAL LOW
GFR calc non Af Amer: 49 — ABNORMAL LOW
GFR calc non Af Amer: 49 — ABNORMAL LOW
GFR calc non Af Amer: 52 — ABNORMAL LOW
GFR calc non Af Amer: 57 — ABNORMAL LOW
GFR calc non Af Amer: 57 — ABNORMAL LOW
Glucose, Bld: 111 — ABNORMAL HIGH
Glucose, Bld: 114 — ABNORMAL HIGH
Glucose, Bld: 139 — ABNORMAL HIGH
Glucose, Bld: 92
Potassium: 4.3
Potassium: 2.9 — ABNORMAL LOW
Potassium: 3.2 — ABNORMAL LOW
Potassium: 3.2 — ABNORMAL LOW
Potassium: 3.5
Potassium: 3.7
Potassium: 3.9
Potassium: 4.6
Sodium: 140
Sodium: 143
Sodium: 135
Sodium: 137
Sodium: 141
Sodium: 141
Sodium: 143

## 2011-06-27 LAB — URINE CULTURE
Colony Count: NO GROWTH
Special Requests: NEGATIVE

## 2011-06-27 LAB — CBC
HCT: 35.7 — ABNORMAL LOW
HCT: 35.9 — ABNORMAL LOW
HCT: 37.3 — ABNORMAL LOW
HCT: 39.4
HCT: 40.3
HCT: 41.1
Hemoglobin: 13.6
Hemoglobin: 12.3 — ABNORMAL LOW
Hemoglobin: 12.3 — ABNORMAL LOW
Hemoglobin: 12.7 — ABNORMAL LOW
Hemoglobin: 12.9 — ABNORMAL LOW
Hemoglobin: 13.8
Hemoglobin: 13.9
MCHC: 33.7
MCHC: 33.3
MCHC: 33.6
MCHC: 33.7
MCHC: 34.2
MCV: 88.6
MCV: 88.6
MCV: 88.8
MCV: 88.8
MCV: 88.9
Platelets: 189
Platelets: 139 — ABNORMAL LOW
Platelets: 149 — ABNORMAL LOW
Platelets: 153
Platelets: 172
Platelets: 201
Platelets: 288
RBC: 5.03
RBC: 4.22
RBC: 4.4
RBC: 4.44
RBC: 4.62
RDW: 15.5
RDW: 15.8 — ABNORMAL HIGH
RDW: 15.9 — ABNORMAL HIGH
RDW: 15.9 — ABNORMAL HIGH
RDW: 16.1 — ABNORMAL HIGH
WBC: 5.5
WBC: 5.9
WBC: 6.3
WBC: 6.4
WBC: 7.2
WBC: 7.5
WBC: 7.8
WBC: 7.8

## 2011-06-27 LAB — DIFFERENTIAL
Basophils Absolute: 0
Basophils Absolute: 0
Basophils Absolute: 0
Basophils Absolute: 0
Basophils Absolute: 0
Basophils Absolute: 0
Basophils Relative: 0
Basophils Relative: 0
Basophils Relative: 0
Basophils Relative: 1
Eosinophils Absolute: 0
Eosinophils Absolute: 0.2
Eosinophils Absolute: 0.4
Eosinophils Absolute: 0.4
Eosinophils Relative: 0
Eosinophils Relative: 10 — ABNORMAL HIGH
Eosinophils Relative: 2
Eosinophils Relative: 5
Eosinophils Relative: 7 — ABNORMAL HIGH
Eosinophils Relative: 7 — ABNORMAL HIGH
Lymphocytes Relative: 15
Lymphocytes Relative: 12
Lymphocytes Relative: 12
Lymphocytes Relative: 20
Lymphocytes Relative: 24
Lymphocytes Relative: 9 — ABNORMAL LOW
Lymphs Abs: 0.5 — ABNORMAL LOW
Lymphs Abs: 0.5 — ABNORMAL LOW
Lymphs Abs: 0.7
Lymphs Abs: 0.9
Lymphs Abs: 1.3
Lymphs Abs: 1.3
Lymphs Abs: 1.5
Monocytes Absolute: 0.3
Monocytes Absolute: 0.4
Monocytes Absolute: 0.6
Monocytes Absolute: 0.6
Monocytes Absolute: 0.7
Monocytes Absolute: 0.7
Monocytes Absolute: 0.7
Monocytes Absolute: 0.9
Monocytes Absolute: 1.1 — ABNORMAL HIGH
Monocytes Relative: 10
Monocytes Relative: 7
Monocytes Relative: 10
Monocytes Relative: 10
Monocytes Relative: 14 — ABNORMAL HIGH
Monocytes Relative: 9
Neutro Abs: 3.5
Neutro Abs: 3.5
Neutro Abs: 5.5
Neutro Abs: 5.7
Neutro Abs: 5.8
Neutrophils Relative %: 54
Neutrophils Relative %: 56
Neutrophils Relative %: 60
Neutrophils Relative %: 71
Neutrophils Relative %: 78 — ABNORMAL HIGH

## 2011-06-27 LAB — CULTURE, BLOOD (ROUTINE X 2): Culture: NO GROWTH

## 2011-06-27 LAB — TYPE AND SCREEN: Antibody Screen: NEGATIVE

## 2011-06-27 LAB — URINE MICROSCOPIC-ADD ON

## 2011-06-27 LAB — COMPREHENSIVE METABOLIC PANEL
Albumin: 3.3 — ABNORMAL LOW
BUN: 11

## 2011-06-27 LAB — URINALYSIS, ROUTINE W REFLEX MICROSCOPIC
Glucose, UA: NEGATIVE
Nitrite: NEGATIVE
Protein, ur: 100 — AB
Urobilinogen, UA: 0.2
pH: 7

## 2011-06-27 LAB — POCT I-STAT 4, (NA,K, GLUC, HGB,HCT)
Glucose, Bld: 104 — ABNORMAL HIGH
Operator id: 114401
Potassium: 4.5

## 2011-06-27 LAB — APTT: aPTT: 25

## 2011-07-07 LAB — BASIC METABOLIC PANEL
BUN: 18
CO2: 26
Calcium: 8.9
Chloride: 106
Chloride: 108
Creatinine, Ser: 1.19
Creatinine, Ser: 1.2
Creatinine, Ser: 1.34
GFR calc Af Amer: >60
GFR calc Af Amer: >60
GFR calc non Af Amer: 53 — ABNORMAL LOW
GFR calc non Af Amer: >60
Glucose, Bld: 132 — ABNORMAL HIGH
Potassium: 3.7

## 2011-07-07 LAB — DIFFERENTIAL
Basophils Absolute: 0
Basophils Relative: 1
Eosinophils Absolute: 0.2
Eosinophils Relative: 3
Lymphocytes Relative: 16
Lymphocytes Relative: 21
Lymphs Abs: 0.9
Monocytes Absolute: 0.7
Monocytes Relative: 13 — ABNORMAL HIGH
Monocytes Relative: 20 — ABNORMAL HIGH
Neutro Abs: 4.7
Neutrophils Relative %: 48
WBC Morphology: INCREASED

## 2011-07-07 LAB — CBC
HCT: 38.2 — ABNORMAL LOW
Hemoglobin: 14.8
MCHC: 34.2
MCV: 89.3
MCV: 89.6
Platelets: 201
RBC: 4.28
RBC: 4.83
RDW: 15.4
WBC: 4.4

## 2011-07-07 LAB — CLOSTRIDIUM DIFFICILE EIA: C difficile Toxins A+B, EIA: NEGATIVE

## 2011-09-24 ENCOUNTER — Other Ambulatory Visit: Payer: Self-pay | Admitting: Cardiology

## 2011-12-05 ENCOUNTER — Other Ambulatory Visit: Payer: Self-pay | Admitting: Cardiology

## 2014-04-19 ENCOUNTER — Other Ambulatory Visit: Payer: Self-pay | Admitting: Family

## 2014-04-19 DIAGNOSIS — I2510 Atherosclerotic heart disease of native coronary artery without angina pectoris: Secondary | ICD-10-CM

## 2014-04-26 ENCOUNTER — Ambulatory Visit
Admission: RE | Admit: 2014-04-26 | Discharge: 2014-04-26 | Disposition: A | Discharge status: Home / Self Care | Payer: Medicare Other | Source: Ambulatory Visit | Attending: Family | Admitting: Family

## 2014-04-26 DIAGNOSIS — I2510 Atherosclerotic heart disease of native coronary artery without angina pectoris: Secondary | ICD-10-CM

## 2014-07-03 ENCOUNTER — Encounter: Payer: Self-pay | Admitting: Surgery

## 2014-07-17 ENCOUNTER — Encounter: Payer: Medicare Other | Admitting: Surgery

## 2014-08-04 ENCOUNTER — Encounter: Payer: Self-pay | Admitting: Surgery

## 2014-08-07 ENCOUNTER — Encounter: Payer: Self-pay | Admitting: Surgery

## 2014-08-07 ENCOUNTER — Ambulatory Visit (INDEPENDENT_AMBULATORY_CARE_PROVIDER_SITE_OTHER): Payer: Medicare Other | Admitting: Surgery

## 2014-08-07 VITALS — BP 155/87 | HR 87 | Ht 66.0 in | Wt 225.0 lb

## 2014-08-07 DIAGNOSIS — I714 Abdominal aortic aneurysm, without rupture, unspecified: Secondary | ICD-10-CM | POA: Insufficient documentation

## 2014-08-07 DIAGNOSIS — I716 Thoracoabdominal aortic aneurysm, without rupture, unspecified: Secondary | ICD-10-CM

## 2014-08-07 NOTE — Progress Notes (Signed)
Patient name: Bryan Carter MRN: [id number] DOB: 02/21/1941 Sex: male   Referred by: Kayren Eaves  Reason for referral:  Chief Complaint  Patient presents with  . AAA    new pt, [facility] [place] [facility]    HISTORY OF PRESENT ILLNESS: This is a 73 year old gentleman who is referred for evaluation of an abdominal aortic aneurysm.  He recently underwent an ultrasound which showed an aneurysm measuring maximum diameter of 4.1 cm.  The patient has a history of prostate cancer.  He has a history of undergoing a colonoscopy complicated by perforation requiring colon resection.  This was done through a midline incision.  He also developed a hernia and has had a incisional hernia performed with mesh.  The patient has a history of coronary artery disease.  He has had a heart attack and has undergone coronary stenting 2.  He is medically managed for hypercholesterolemia with a statin.  He quit smoking approximately 8 years ago when he had his heart attack.  Past Medical History  Diagnosis Date  . Hypertension   . Hyperlipidemia   . Hypothyroidism   . Myocardial infarction     Past Surgical History  Procedure Laterality Date  . Hernia repair      History   Social History  . Marital Status: Married    Spouse Name: N/A    Number of Children: N/A  . Years of Education: N/A   Occupational History  . Not on file.   Social History Main Topics  . Smoking status: Former Smoker    Types: Cigarettes    Quit date: 10/06/2006  . Smokeless tobacco: Not on file  . Alcohol Use: No  . Drug Use: No  . Sexual Activity: Not on file   Other Topics Concern  . Not on file   Social History Narrative    Family History  Problem Relation Age of Onset  . Heart attack Mother   . Heart attack Father     Allergies as of 08/07/2014 - never reviewed  Allergen Reaction Noted  . Quinapril Cough 07/03/2014    Current Outpatient Prescriptions on File Prior to Visit  Medication Sig Dispense Refill    . amLODipine (NORVASC) 5 MG tablet Take 5 mg by mouth daily.    Marland Kitchen aspirin 81 MG tablet Take 81 mg by mouth daily.    Marland Kitchen atorvastatin (LIPITOR) 20 MG tablet Take 20 mg by mouth daily.    . hydrochlorothiazide (HYDRODIURIL) 25 MG tablet Take 25 mg by mouth daily.    Marland Kitchen levothyroxine (SYNTHROID, LEVOTHROID) 75 MCG tablet Take 75 mcg by mouth daily before breakfast.    . losartan (COZAAR) 100 MG tablet Take 100 mg by mouth daily.     No current facility-administered medications on file prior to visit.     REVIEW OF SYSTEMS: Cardiovascular: No chest pain, chest pressure, palpitations, orthopnea, or dyspnea on exertion. No claudication or rest pain,  No history of DVT or phlebitis.positive for leg swelling Pulmonary: No productive cough, asthma or wheezing. Neurologic: No weakness, paresthesias, aphasia, or amaurosis. No dizziness. Hematologic: No bleeding problems or clotting disorders. Musculoskeletal: No joint pain or joint swelling. Gastrointestinal: No blood in stool or hematemesis Genitourinary: No dysuria or hematuria. Psychiatric:: No history of major depression. Integumentary: No rashes or ulcers. Constitutional: No fever or chills.  PHYSICAL EXAMINATION: General: The patient appears their stated age.  Vital signs are BP 155/87 mmHg  Pulse 87  Ht 5\' 6"  (1.676 m)  Wt  225 lb (102.059 kg)  BMI 36.33 kg/m2  SpO2 96% HEENT:  No gross abnormalities Pulmonary: Respirations are non-labored Abdomen: Soft and non-tender, obese  Musculoskeletal: There are no major deformities.   Neurologic: No focal weakness or paresthesias are detected, Skin: There are no ulcer or rashes noted. Psychiatric: The patient has normal affect. Cardiovascular: There is a regular rate and rhythm without significant murmur appreciated.no carotid bruits.  I cannot palpate pedal pulses.  Diagnostic Studies: I have reviewed his outside ultrasound which shows a infrarenal abdominal aortic aneurysm with maximum  diameter of 4.1 cm    Assessment:  Abdominal aortic aneurysm Plan: I discussed the risk of rupture as well as the risk of treatment.  I have recommended repair of his aneurysm when it reaches 5 cm.  I will plan on having him back in one year for CT angiogram of the chest abdomen and pelvis to get a baseline evaluation of the maximum diameter, to make sure this correlates with ultrasound.  I suspect ultrasound may be somewhat challenging given his body habitus.     Jorge NyV. Wells Brabham IV, M.D. [facility] Office: [phone] Pager:  [phone]

## 2014-08-07 NOTE — Addendum Note (Signed)
Addended by: Sharee Pimple on: 08/07/2014 10:59 AM   Modules accepted: Orders

## 2015-03-03 ENCOUNTER — Emergency Department (INDEPENDENT_AMBULATORY_CARE_PROVIDER_SITE_OTHER): Payer: Medicare HMO

## 2015-03-03 ENCOUNTER — Encounter (HOSPITAL_COMMUNITY): Payer: Self-pay

## 2015-03-03 ENCOUNTER — Emergency Department (HOSPITAL_COMMUNITY)
Admission: EM | Admit: 2015-03-03 | Discharge: 2015-03-03 | Disposition: A | Discharge status: Home / Self Care | Payer: Medicare HMO | Source: Home / Self Care | Attending: Family Medicine | Admitting: Family Medicine

## 2015-03-03 DIAGNOSIS — S82401A Unspecified fracture of shaft of right fibula, initial encounter for closed fracture: Secondary | ICD-10-CM

## 2015-03-03 DIAGNOSIS — M25571 Pain in right ankle and joints of right foot: Secondary | ICD-10-CM

## 2015-03-03 MED ORDER — TRAMADOL-ACETAMINOPHEN 37.5-325 MG PO TABS: 1.0000 | ORAL_TABLET | Freq: Four times a day (QID) | ORAL | Status: DC | PRN

## 2015-03-03 MED ORDER — ACETAMINOPHEN 325 MG PO TABS
ORAL_TABLET | ORAL | Status: AC
  Filled 2015-03-03: qty 2

## 2015-03-03 MED ORDER — ACETAMINOPHEN 325 MG PO TABS
650.0000 mg | ORAL_TABLET | Freq: Once | ORAL | Status: AC
  Administered 2015-03-03: 650 mg via ORAL

## 2015-03-03 NOTE — Discharge Instructions (Signed)
Please go to your orthopedic appointment.  Someone will call you regarding this in a few days.   Ankle Fracture A fracture is a break in a bone. The ankle joint is made up of three bones. These include the lower (distal)sections of your lower leg bones, called the tibia and fibula, along with a bone in your foot, called the talus. Depending on how bad the break is and if more than one ankle joint bone is broken, a cast or splint is used to protect and keep your injured bone from moving while it heals. Sometimes, surgery is required to help the fracture heal properly.  There are two general types of fractures:  Stable fracture. This includes a single fracture line through one bone, with no injury to ankle ligaments. A fracture of the talus that does not have any displacement (movement of the bone on either side of the fracture line) is also stable.  Unstable fracture. This includes more than one fracture line through one or more bones in the ankle joint. It also includes fractures that have displacement of the bone on either side of the fracture line. CAUSES  A direct blow to the ankle.   Quickly and severely twisting your ankle.  Trauma, such as a car accident or falling from a significant height. RISK FACTORS You may be at a higher risk of ankle fracture if:  You have certain medical conditions.  You are involved in high-impact sports.  You are involved in a high-impact car accident. SIGNS AND SYMPTOMS   Tender and swollen ankle.  Bruising around the injured ankle.  Pain on movement of the ankle.  Difficulty walking or putting weight on the ankle.  A cold foot below the site of the ankle injury. This can occur if the blood vessels passing through your injured ankle were also damaged.  Numbness in the foot below the site of the ankle injury. DIAGNOSIS  An ankle fracture is usually diagnosed with a physical exam and X-rays. A CT scan may also be required for complex  fractures. TREATMENT  Stable fractures are treated with a cast or splint and using crutches to avoid putting weight on your injured ankle. This is followed by an ankle strengthening program. Some patients require a special type of cast, depending on other medical problems they may have. Unstable fractures require surgery to ensure the bones heal properly. Your health care provider will tell you what type of fracture you have and the best treatment for your condition. HOME CARE INSTRUCTIONS   Review correct crutch use with your health care provider and use your crutches as directed. Safe use of crutches is extremely important. Misuse of crutches can cause you to fall or cause injury to nerves in your hands or armpits.  Do not put weight or pressure on the injured ankle until directed by your health care provider.  To lessen the swelling, keep the injured leg elevated while sitting or lying down.  Apply ice to the injured area:  Put ice in a plastic bag.  Place a towel between your cast and the bag.  Leave the ice on for 20 minutes, 2-3 times a day.  If you have a plaster or fiberglass cast:  Do not try to scratch the skin under the cast with any objects. This can increase your risk of skin infection.  Check the skin around the cast every day. You may put lotion on any red or sore areas.  Keep your cast dry and clean.  If you have a plaster splint:  Wear the splint as directed.  You may loosen the elastic around the splint if your toes become numb, tingle, or turn cold or blue.  Do not put pressure on any part of your cast or splint; it may break. Rest your cast only on a pillow the first 24 hours until it is fully hardened.  Your cast or splint can be protected during bathing with a plastic bag sealed to your skin with medical tape. Do not lower the cast or splint into water.  Take medicines as directed by your health care provider. Only take over-the-counter or prescription  medicines for pain, discomfort, or fever as directed by your health care provider.  Do not drive a vehicle until your health care provider specifically tells you it is safe to do so.  If your health care provider has given you a follow-up appointment, it is very important to keep that appointment. Not keeping the appointment could result in a chronic or permanent injury, pain, and disability. If you have any problem keeping the appointment, call the facility for assistance. SEEK MEDICAL CARE IF: You develop increased swelling or discomfort. SEEK IMMEDIATE MEDICAL CARE IF:   Your cast gets damaged or breaks.  You have continued severe pain.  You develop new pain or swelling after the cast was put on.  Your skin or toenails below the injury turn blue or gray.  Your skin or toenails below the injury feel cold, numb, or have loss of sensitivity to touch.  There is a bad smell or pus draining from under the cast. MAKE SURE YOU:   Understand these instructions.  Will watch your condition.  Will get help right away if you are not doing well or get worse. Document Released: 09/19/2000 Document Revised: 09/27/2013 Document Reviewed: 04/21/2013 [facility] Patient Information 2015 [place], [place]. This information is not intended to replace advice given to you by your health care provider. Make sure you discuss any questions you have with your health care provider.

## 2015-03-03 NOTE — ED Provider Notes (Signed)
CSN: [id number]     Arrival date & time 03/03/15  1631 History   First MD Initiated Contact with Patient 03/03/15 1712     Chief Complaint  Patient presents with  . Foot Injury   (Consider location/radiation/quality/duration/timing/severity/associated sxs/prior Treatment) Patient is a 74 y.o. male presenting with foot injury. The history is provided by the patient and a relative.  Foot Injury Location:  Ankle Time since incident:  3 hours Injury: yes   Mechanism of injury comment:  Patient jumping off of a six foot fence.  Ankle location:  R ankle Pain details:    Quality:  Aching   Radiates to:  Does not radiate   Severity:  Moderate   Onset quality:  Sudden   Timing:  Constant   Progression:  Worsening Chronicity:  New Dislocation: no   Foreign body present:  No foreign bodies Prior injury to area:  No Relieved by:  NSAIDs Worsened by:  Bearing weight Associated symptoms: swelling   Associated symptoms: no back pain, no fatigue, no fever, no muscle weakness and no numbness   Risk factors: no frequent fractures, no known bone disorder and no recent illness     Past Medical History  Diagnosis Date  . Hypertension   . Hyperlipidemia   . Hypothyroidism   . Myocardial infarction    Past Surgical History  Procedure Laterality Date  . Hernia repair     Family History  Problem Relation Age of Onset  . Heart attack Mother   . Heart attack Father    History  Substance Use Topics  . Smoking status: Former Smoker    Types: Cigarettes    Quit date: 10/06/2006  . Smokeless tobacco: Not on file  . Alcohol Use: No    Review of Systems  Constitutional: Negative for fever and fatigue.  Cardiovascular: Positive for leg swelling. Negative for chest pain.  Gastrointestinal: Negative for abdominal pain.  Genitourinary: Negative.   Musculoskeletal: Positive for joint swelling and arthralgias. Negative for back pain and neck stiffness.  Neurological: Negative for dizziness.     Allergies  Quinapril  Home Medications   Prior to Admission medications   Medication Sig Start Date End Date Taking? Authorizing Provider  amLODipine (NORVASC) 5 MG tablet Take 5 mg by mouth daily.    Historical Provider, MD  aspirin 81 MG tablet Take 81 mg by mouth daily.    Historical Provider, MD  atorvastatin (LIPITOR) 20 MG tablet Take 20 mg by mouth daily.    Historical Provider, MD  hydrochlorothiazide (HYDRODIURIL) 25 MG tablet Take 25 mg by mouth daily.    Historical Provider, MD  levothyroxine (SYNTHROID, LEVOTHROID) 75 MCG tablet Take 75 mcg by mouth daily before breakfast.    Historical Provider, MD  losartan (COZAAR) 100 MG tablet Take 100 mg by mouth daily.    Historical Provider, MD   BP 163/86 mmHg  Pulse 97  Temp(Src) 97.9 F (36.6 C) (Oral)  Resp 16  SpO2 96% Physical Exam  Constitutional: He is oriented to person, place, and time.  HENT:  Head: Head is without abrasion and without contusion.  Cardiovascular: Normal rate and regular rhythm.   Pulmonary/Chest: Effort normal and breath sounds normal.  Musculoskeletal:       Right ankle: He exhibits swelling. He exhibits no ecchymosis, no deformity, no laceration and normal pulse. Tenderness. Lateral malleolus and AITFL tenderness found. No medial malleolus, no CF ligament, no posterior TFL, no head of 5th metatarsal and no proximal fibula tenderness  found. Achilles tendon normal.  Neurological: He is alert and oriented to person, place, and time.  Skin: Skin is warm and dry.  Psychiatric: He has a normal mood and affect.    ED Course  Procedures (including critical care time) Labs Review Labs Reviewed - No data to display  Imaging Review Dg Ankle Complete Right  03/03/2015   CLINICAL DATA:  Lateral ankle pain post fall, jumped off fence  EXAM: RIGHT ANKLE - COMPLETE 3+ VIEW  COMPARISON:  None.  FINDINGS: Three views of the right ankle submitted. There is subtle nondisplaced oblique fracture in distal  fibula. Soft tissue swelling adjacent to lateral malleolus.  IMPRESSION: Subtle nondisplaced oblique fracture in distal fibula. There is adjacent soft tissue swelling.   Electronically Signed   By: Natasha Mead M.D.   On: 03/03/2015 18:24     MDM   1. Acute ankle pain, right   2. Closed fibular fracture, right, initial encounter    Patient referred to orthopedics to be seen in the next week.  Cam walker applied and patient advised to ice the splint and to "take it easy" until his orthopedic appointment.  Advised patient avoid NSAIDs due to his age.  I have written for [facility].     Ofilia Neas, PA-C 03/03/15 [phone]

## 2015-03-03 NOTE — ED Notes (Signed)
Right foot injury x today

## 2015-08-09 ENCOUNTER — Encounter: Payer: Self-pay | Admitting: Surgery

## 2015-08-10 ENCOUNTER — Other Ambulatory Visit: Payer: Self-pay

## 2015-08-10 DIAGNOSIS — I714 Abdominal aortic aneurysm, without rupture, unspecified: Secondary | ICD-10-CM

## 2015-08-10 DIAGNOSIS — Z01812 Encounter for preprocedural laboratory examination: Secondary | ICD-10-CM

## 2015-08-13 ENCOUNTER — Ambulatory Visit
Admission: RE | Admit: 2015-08-13 | Discharge: 2015-08-13 | Disposition: A | Discharge status: Home / Self Care | Payer: Medicare HMO | Source: Ambulatory Visit | Attending: Surgery | Admitting: Surgery

## 2015-08-13 ENCOUNTER — Ambulatory Visit (INDEPENDENT_AMBULATORY_CARE_PROVIDER_SITE_OTHER): Payer: Medicare HMO | Admitting: Surgery

## 2015-08-13 ENCOUNTER — Encounter: Payer: Self-pay | Admitting: Surgery

## 2015-08-13 VITALS — BP 155/81 | HR 88 | Temp 98.3°F | Resp 16 | Ht 66.0 in | Wt 226.0 lb

## 2015-08-13 DIAGNOSIS — I714 Abdominal aortic aneurysm, without rupture, unspecified: Secondary | ICD-10-CM

## 2015-08-13 DIAGNOSIS — I716 Thoracoabdominal aortic aneurysm, without rupture, unspecified: Secondary | ICD-10-CM

## 2015-08-13 MED ORDER — IOPAMIDOL (ISOVUE-370) INJECTION 76%
75.0000 mL | Freq: Once | INTRAVENOUS | Status: AC | PRN
  Administered 2015-08-13: 75 mL via INTRAVENOUS

## 2015-08-13 NOTE — Progress Notes (Signed)
Filed Vitals:   08/13/15 1352 08/13/15 1402  BP: 159/87 155/81  Pulse: 99 88  Temp: 98.3 F (36.8 C)   TempSrc: Oral   Resp: 16   Height: 5\' 6"  (1.676 m)   Weight: 226 lb (102.513 kg)   SpO2: 96%

## 2015-08-13 NOTE — Progress Notes (Signed)
Patient name: Bryan Carter MRN: [id number] DOB: 21-Apr-1941 Sex: male     Chief Complaint  Patient presents with  . Re-evaluation    1 yr  AAA   F/U      HISTORY OF PRESENT ILLNESS: The patient is back for follow-up of abdominal aortic aneurysm.  Previously it measured 4.1 cm by ultrasound.he has no complaints today  The patient has a history of prostate cancer. He has a history of undergoing a colonoscopy complicated by perforation requiring colon resection. This was done through a midline incision. He also developed a hernia and has had a incisional hernia performed with mesh.  The patient has a history of coronary artery disease. He has had a heart attack and has undergone coronary stenting 2. He is medically managed for hypercholesterolemia with a statin. He quit smoking approximately 8 years ago when he had his heart attack  Past Medical History  Diagnosis Date  . Hypertension   . Hyperlipidemia   . Hypothyroidism   . Myocardial infarction (HCC)   . AAA (abdominal aortic aneurysm) [facility])     Past Surgical History  Procedure Laterality Date  . Hernia repair      Social History   Social History  . Marital Status: Married    Spouse Name: N/A  . Number of Children: N/A  . Years of Education: N/A   Occupational History  . Not on file.   Social History Main Topics  . Smoking status: Former Smoker    Types: Cigarettes    Quit date: 10/06/2006  . Smokeless tobacco: Never Used  . Alcohol Use: No  . Drug Use: No  . Sexual Activity: Not on file   Other Topics Concern  . Not on file   Social History Narrative    Family History  Problem Relation Age of Onset  . Heart attack Mother   . Heart attack Father     Allergies as of 08/13/2015 - Review Complete 08/13/2015  Allergen Reaction Noted  . Quinapril Cough 07/03/2014    Current Outpatient Prescriptions on File Prior to Visit  Medication Sig Dispense Refill  . amLODipine (NORVASC) 5 MG tablet  Take 5 mg by mouth daily.    Marland Kitchen aspirin 81 MG tablet Take 81 mg by mouth daily.    Marland Kitchen atorvastatin (LIPITOR) 20 MG tablet Take 20 mg by mouth daily.    . hydrochlorothiazide (HYDRODIURIL) 25 MG tablet Take 25 mg by mouth daily.    Marland Kitchen levothyroxine (SYNTHROID, LEVOTHROID) 75 MCG tablet Take 75 mcg by mouth daily before breakfast.    . losartan (COZAAR) 100 MG tablet Take 100 mg by mouth daily.    . traMADol-acetaminophen (ULTRACET) 37.5-325 MG per tablet Take 1-2 tablets by mouth every 6 (six) hours as needed for severe pain. (Patient not taking: Reported on 08/13/2015) 40 tablet 0   No current facility-administered medications on file prior to visit.     REVIEW OF SYSTEMS: Cardiovascular: No chest pain, chest pressure, palpitations, orthopnea, or dyspnea on exertion. Positive for pain in his hip with activity.  Positive for leg swelling Pulmonary: No productive cough, asthma or wheezing. Neurologic: No weakness, paresthesias, aphasia, or amaurosis. No dizziness. Hematologic: No bleeding problems or clotting disorders. Musculoskeletal: No joint pain or joint swelling. Gastrointestinal: No blood in stool or hematemesis Genitourinary: No dysuria or hematuria. Psychiatric:: No history of major depression. Integumentary: No rashes or ulcers. Constitutional: No fever or chills.  PHYSICAL EXAMINATION:   Vital signs are  Filed Vitals:   08/13/15 1352 08/13/15 1402  BP: 159/87 155/81  Pulse: 99 88  Temp: 98.3 F (36.8 C)   TempSrc: Oral   Resp: 16   Height:  (1.676 m)   Weight: 226 lb (102.513 kg)   SpO2: 96%    Body mass index is 36.49 kg/(m^2). General: The patient appears their stated age. HEENT:  No gross abnormalities Pulmonary:  Non labored breathing Abdomen: Soft and non-tender.  No pulsatile mass Musculoskeletal: There are no major deformities. Neurologic: No focal weakness or paresthesias are detected, Skin: There are no ulcer or rashes noted. Psychiatric: The patient  has normal affect. Cardiovascular: There is a regular rate and rhythm without significant murmur appreciated.no carotid bruits   Diagnostic Studies I have reviewed his CT scan with the following findings: 1. Fusiform infrarenal abdominal aortic aneurysm measuring 3.9 x 4.1 cm in greatest respective width and AP dimensions. Infrarenal aneurysm neck measures 2 x 2.5 cm. 2. 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 3. Approximately 50% narrowing in the proximal trunk of the superior mesenteric artery. 4. Approximately 50% stenosis of the right renal artery. 5. 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 The origin of the right SFA is chronically narrowed with the visualized proximal SFA nearly occluded. The profunda femoral artery appears normally patent on the right. 6. 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 Diffuse disease of the visualized left SFA including origin stenosis of at least 70%. 7. Significant degenerative disc disease in the lumbar spine.   Assessment: Abdominal aortic aneurysm Plan: CT scan today shows that there has been minimal change in size was aneurysm.  Maximum diameter today was 4.1 cm.  I have recommended that the patient come back in one year with an ultrasound for follow-up.  At the time of repair if he has an endovascular candidate, I will need to evaluate the possible stenosis in his right femoral artery.  Jorge Ny, M.D. [facility] [place] Office: [phone] Pager:  [phone]   b

## 2015-08-13 NOTE — Addendum Note (Signed)
Addended by: Adria Dill L on: 08/13/2015 05:39 PM   Modules accepted: Orders

## 2016-07-31 ENCOUNTER — Other Ambulatory Visit (HOSPITAL_COMMUNITY): Payer: Self-pay | Admitting: Internal Medicine

## 2016-07-31 DIAGNOSIS — R609 Edema, unspecified: Secondary | ICD-10-CM

## 2016-08-06 ENCOUNTER — Ambulatory Visit (HOSPITAL_COMMUNITY)
Admission: RE | Admit: 2016-08-06 | Discharge: 2016-08-06 | Disposition: A | Discharge status: Home / Self Care | Payer: Medicare HMO | Source: Ambulatory Visit | Attending: Internal Medicine | Admitting: Internal Medicine

## 2016-08-06 DIAGNOSIS — R609 Edema, unspecified: Secondary | ICD-10-CM | POA: Insufficient documentation

## 2016-08-13 ENCOUNTER — Encounter: Payer: Self-pay | Admitting: Family

## 2016-08-18 ENCOUNTER — Ambulatory Visit (INDEPENDENT_AMBULATORY_CARE_PROVIDER_SITE_OTHER): Payer: Medicare HMO | Admitting: Family

## 2016-08-18 ENCOUNTER — Ambulatory Visit (HOSPITAL_COMMUNITY)
Admission: RE | Admit: 2016-08-18 | Discharge: 2016-08-18 | Disposition: A | Discharge status: Home / Self Care | Payer: Medicare HMO | Source: Ambulatory Visit | Attending: Vascular Surgery | Admitting: Vascular Surgery

## 2016-08-18 ENCOUNTER — Other Ambulatory Visit: Payer: Self-pay | Admitting: *Deleted

## 2016-08-18 ENCOUNTER — Encounter: Payer: Self-pay | Admitting: Family

## 2016-08-18 VITALS — BP 140/88 | HR 78 | Temp 97.5°F | Resp 20 | Ht 66.0 in | Wt 230.5 lb

## 2016-08-18 DIAGNOSIS — I714 Abdominal aortic aneurysm, without rupture, unspecified: Secondary | ICD-10-CM

## 2016-08-18 DIAGNOSIS — Z87891 Personal history of nicotine dependence: Secondary | ICD-10-CM

## 2016-08-18 NOTE — Patient Instructions (Addendum)
Abdominal Aortic Aneurysm An aneurysm is a weakened or damaged part of an artery wall that bulges from the normal force of blood pumping through the body. An abdominal aortic aneurysm is an aneurysm that occurs in the lower part of the aorta, the main artery of the body.  The major concern with an abdominal aortic aneurysm is that it can enlarge and burst (rupture) or blood can flow between the layers of the wall of the aorta through a tear (aorticdissection). Both of these conditions can cause bleeding inside the body and can be life threatening unless diagnosed and treated promptly. CAUSES  The exact cause of an abdominal aortic aneurysm is unknown. Some contributing factors are:   A hardening of the arteries caused by the buildup of fat and other substances in the lining of a blood vessel (arteriosclerosis).  Inflammation of the walls of an artery (arteritis).   Connective tissue diseases, such as Marfan syndrome.   Abdominal trauma.   An infection, such as syphilis or staphylococcus, in the wall of the aorta (infectious aortitis) caused by bacteria. RISK FACTORS  Risk factors that contribute to an abdominal aortic aneurysm may include:  Age older than 60 years.   High blood pressure (hypertension).  Male gender.  Ethnicity (white race).  Obesity.  Family history of aneurysm (first degree relatives only).  Tobacco use. PREVENTION  The following healthy lifestyle habits may help decrease your risk of abdominal aortic aneurysm:  Quitting smoking. Smoking can raise your blood pressure and cause arteriosclerosis.  Limiting or avoiding alcohol.  Keeping your blood pressure, blood sugar level, and cholesterol levels within normal limits.  Decreasing your salt intake. In somepeople, too much salt can raise blood pressure and increase your risk of abdominal aortic aneurysm.  Eating a diet low in saturated fats and cholesterol.  Increasing your fiber intake by including  whole grains, vegetables, and fruits in your diet. Eating these foods may help lower blood pressure.  Maintaining a healthy weight.  Staying physically active and exercising regularly. SYMPTOMS  The symptoms of abdominal aortic aneurysm may vary depending on the size and rate of growth of the aneurysm.Most grow slowly and do not have any symptoms. When symptoms do occur, they may include:  Pain (abdomen, side, lower back, or groin). The pain may vary in intensity. A sudden onset of severe pain may indicate that the aneurysm has ruptured.  Feeling full after eating only small amounts of food.  Nausea or vomiting or both.  Feeling a pulsating lump in the abdomen.  Feeling faint or passing out. DIAGNOSIS  Since most unruptured abdominal aortic aneurysms have no symptoms, they are often discovered during diagnostic exams for other conditions. An aneurysm may be found during the following procedures:  Ultrasonography (A one-time screening for abdominal aortic aneurysm by ultrasonography is also recommended for all men aged 65-75 years who have ever smoked).  X-ray exams.  A computed tomography (CT).  Magnetic resonance imaging (MRI).  Angiography or arteriography. TREATMENT  Treatment of an abdominal aortic aneurysm depends on the size of your aneurysm, your age, and risk factors for rupture. Medication to control blood pressure and pain may be used to manage aneurysms smaller than 6 cm. Regular monitoring for enlargement may be recommended by your caregiver if:  The aneurysm is 3-4 cm in size (an annual ultrasonography may be recommended).  The aneurysm is 4-4.5 cm in size (an ultrasonography every 6 months may be recommended).  The aneurysm is larger than 4.5 cm in   size (your caregiver may ask that you be examined by a vascular surgeon). If your aneurysm is larger than 6 cm, surgical repair may be recommended. There are two main methods for repair of an aneurysm:   Endovascular  repair (a minimally invasive surgery). This is done most often.  Open repair. This method is used if an endovascular repair is not possible.   This information is not intended to replace advice given to you by your health care provider. Make sure you discuss any questions you have with your health care provider.   Document Released: 07/02/2005 Document Revised: 01/17/2013 Document Reviewed: 10/22/2012 Elsevier Interactive Patient Education 2016 Elsevier Inc.    Before your next abdominal ultrasound:  Take two Extra-Strength Gas-X capsules at bedtime the night before the test. Take another two Extra-Strength Gas-X capsules 3 hours before the test.   

## 2016-08-18 NOTE — Progress Notes (Signed)
[facility] [place]   CC: Follow up Abdominal Aortic Aneurysm  History of Present Illness  Bryan SpryRobert W Carter is a 75 y.o. (16-Dec-1940) male patient of Dr. Myra GianottiBrabham who returns for follow-up of abdominal aortic aneurysm.  Previously it measured 4.1 cm by ultrasound.  The patient has a history of prostate cancer. He has a history of undergoing a colonoscopy complicated by perforation requiring colon resection. This was done through a midline incision. He also developed a hernia and has had a incisional hernia performed with mesh.  The patient has a history of coronary artery disease. He has had a heart attack and has undergone coronary stenting 2. He is medically managed for hypercholesterolemia with a statin. He quit smoking about 2008 when he had his heart attack. He takes a daily 81 mg ASA.   Pt states he had an echocardiogram recently re dyspnea and swelling in his right leg, states requested by his PCP.  He rides a stationary bike for an hour 5 days/week an does upper body exercises also. He has a history of a right ankle injury in 2016. He reports right hip pain.  He denies back or abdominal pain.   The patient denies history of stroke or TIA symptoms.  Pt Diabetic: No Pt smoker: former smoker, quit in 2008  Past Medical History:  Diagnosis Date  . AAA (abdominal aortic aneurysm) (HCC)   . Hyperlipidemia   . Hypertension   . Hypothyroidism   . Myocardial infarction    Past Surgical History:  Procedure Laterality Date  . HERNIA REPAIR     Social History Social History   Social History  . Marital status: Married    Spouse name: N/A  . Number of children: N/A  . Years of education: N/A   Occupational History  . Not on file.   Social History Main Topics  . Smoking status: Former Smoker    Types: Cigarettes    Quit date: 10/06/2006  . Smokeless tobacco: Former NeurosurgeonUser    Quit date: 08/18/1965  . Alcohol use No  . Drug use:     Types: GHB   . Sexual activity: Not on file   Other Topics Concern  . Not on file   Social History Narrative  . No narrative on file   Family History Family History  Problem Relation Age of Onset  . Heart attack Mother   . Heart disease Mother   . Heart attack Father   . Heart disease Father     Current Outpatient Prescriptions on File Prior to Visit  Medication Sig Dispense Refill  . amLODipine (NORVASC) 5 MG tablet Take 5 mg by mouth daily.    Marland Kitchen. aspirin 81 MG tablet Take 81 mg by mouth daily.    Marland Kitchen. atorvastatin (LIPITOR) 20 MG tablet Take 20 mg by mouth daily.    . hydrochlorothiazide (HYDRODIURIL) 25 MG tablet Take 25 mg by mouth daily.    Marland Kitchen. levothyroxine (SYNTHROID, LEVOTHROID) 75 MCG tablet Take 75 mcg by mouth daily before breakfast.    . losartan (COZAAR) 100 MG tablet Take 100 mg by mouth daily.    . traMADol-acetaminophen (ULTRACET) 37.5-325 MG per tablet Take 1-2 tablets by mouth every 6 (six) hours as needed for severe pain. (Patient not taking: Reported on 08/18/2016) 40 tablet 0   No current facility-administered medications on file prior to visit.    Allergies  Allergen Reactions  . Quinapril Cough    ROS: See HPI for pertinent positives  and negatives.  Physical Examination  Vitals:   08/18/16 0918  BP: 140/88  Pulse: 78  Resp: 20  Temp: 97.5 F (36.4 C)  TempSrc: Oral  SpO2: 96%  Weight: 230 lb 8 oz (104.6 kg)  Height: 5\' 6"  (1.676 m)   Body mass index is 37.2 kg/m.  General: A&O x 3, WD, obese male.  Pulmonary: Sym exp, respirations are non labored, good air movt, CTAB, no rales, rhonchi, or wheezing.  Cardiac: RRR, Nl S1, S2, no detected murmur.   Carotid Bruits Right Left   Negative Negative   Aorta is not palpable Radial pulses are 2+ palpable and =                          VASCULAR EXAM:                                                                                                         LE Pulses Right Left       FEMORAL  not palpable  (obese)  not palpable (obese)        POPLITEAL  not palpable   not palpable       POSTERIOR TIBIAL  not palpable   not palpable        DORSALIS PEDIS      ANTERIOR TIBIAL not palpable  not palpable      Gastrointestinal: soft, NTND, -G/R, - HSM, - masses palpated, - CVAT B.  Musculoskeletal: M/S 5/5 throughout, Extremities without ischemic changes.  Neurologic: CN 2-12 intact, Pain and light touch intact in extremities are intact, Motor exam as listed above.    CTA ABD/PELVIS 08-13-15: 1. Fusiform infrarenal abdominal aortic aneurysm measuring 3.9 x 4.1 cm in greatest respective width and AP dimensions. Infrarenal aneurysm neck measures 2 x 2.5 cm. 2. 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 3. Approximately 50% narrowing in the proximal trunk of the superior mesenteric artery. 4. Approximately 50% stenosis of the right renal artery. 5. 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 The origin of the right SFA is chronically narrowed with the visualized proximal SFA nearly occluded. The profunda femoral artery appears normally patent on the right. 6. 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 Diffuse disease of the visualized left SFA including origin stenosis of at least 70%. 7. Significant degenerative disc disease in the lumbar spine.    Non-Invasive Vascular Imaging  AAA Duplex (08/18/2016)  Previous size: 4.1 cm (CTA) (Date: 08-13-15)  Current size:  4.18 cm (Date: 08-18-16), bases on limited visualization due to overlying bowel gas and pt body habitus. Bilateral iliac arteries not visualized.   Medical Decision Making  The patient is a 75 y.o. male who presents with asymptomatic AAA with no increase in size compared to CTA on 08-13-15, and based on limited visualization.   In Dr. Estanislado Spire 08/13/15 assessment: At the time of repair if pt is an endovascular candidate, I will need to evaluate the  possible stenosis  in his right femoral artery.  His pedal pulses are not palpable, but there his feet are warm, all toes have brisk capillary refill, no signs of ischemia in his feet/legs. He c/o right hip pain, does not seem to indicate claudication sx's in his legs with walking. He does ride a stationary bike for an hour 5 days/week.    Based on this patient's exam and diagnostic studies, the patient will follow up in 1 year  with the following studies: AAA duplex and ABI's.  Consideration for repair of AAA would be made when the size is 5.0 cm, growth > 1 cm/yr, and symptomatic status.  I emphasized the importance of maximal medical management including strict control of blood pressure, blood glucose, and lipid levels, antiplatelet agents, obtaining regular exercise, and continued cessation of smoking.   The patient was given information about AAA including signs, symptoms, treatment, and how to minimize the risk of enlargement and rupture of aneurysms.    The patient was advised to call [phone] should the patient experience sudden onset abdominal or back pain.   Thank you for allowing [place] to participate in this patient's care.  Charisse March, RN, MSN, FNP-C [facility] [place] Office: [phone]  Clinic Physician: Imogene Burn on call  08/18/2016, 9:24 AM

## 2016-10-01 ENCOUNTER — Other Ambulatory Visit: Payer: Self-pay | Admitting: Cardiology

## 2016-10-01 DIAGNOSIS — R079 Chest pain, unspecified: Secondary | ICD-10-CM

## 2016-10-13 ENCOUNTER — Ambulatory Visit (HOSPITAL_COMMUNITY)
Admission: RE | Admit: 2016-10-13 | Discharge: 2016-10-13 | Disposition: A | Discharge status: Home / Self Care | Payer: Medicare HMO | Source: Ambulatory Visit | Attending: Cardiology | Admitting: Cardiology

## 2016-10-13 DIAGNOSIS — I2510 Atherosclerotic heart disease of native coronary artery without angina pectoris: Secondary | ICD-10-CM | POA: Diagnosis not present

## 2016-10-13 DIAGNOSIS — R079 Chest pain, unspecified: Secondary | ICD-10-CM | POA: Diagnosis present

## 2016-10-13 DIAGNOSIS — F17200 Nicotine dependence, unspecified, uncomplicated: Secondary | ICD-10-CM | POA: Diagnosis not present

## 2016-10-13 MED ORDER — TECHNETIUM TC 99M TETROFOSMIN IV KIT
30.0000 | PACK | Freq: Once | INTRAVENOUS | Status: AC | PRN
  Administered 2016-10-13: 30 via INTRAVENOUS

## 2016-10-13 MED ORDER — REGADENOSON 0.4 MG/5ML IV SOLN
INTRAVENOUS | Status: AC
  Administered 2016-10-13: 0.4 mg via INTRAVENOUS
  Filled 2016-10-13: qty 5

## 2016-10-13 MED ORDER — TECHNETIUM TC 99M TETROFOSMIN IV KIT
10.0000 | PACK | Freq: Once | INTRAVENOUS | Status: AC | PRN
  Administered 2016-10-13: 10 via INTRAVENOUS

## 2016-10-13 MED ORDER — REGADENOSON 0.4 MG/5ML IV SOLN
0.4000 mg | Freq: Once | INTRAVENOUS | Status: AC
  Administered 2016-10-13: 0.4 mg via INTRAVENOUS

## 2016-10-16 ENCOUNTER — Encounter (HOSPITAL_COMMUNITY): Payer: Self-pay | Admitting: Cardiology

## 2016-10-16 ENCOUNTER — Other Ambulatory Visit: Payer: Self-pay

## 2016-10-16 ENCOUNTER — Encounter (HOSPITAL_COMMUNITY)
Admission: RE | Disposition: A | Discharge status: Home / Self Care | Payer: Self-pay | Source: Ambulatory Visit | Attending: Cardiology

## 2016-10-16 ENCOUNTER — Ambulatory Visit (HOSPITAL_COMMUNITY)
Admission: RE | Admit: 2016-10-16 | Discharge: 2016-10-17 | Disposition: A | Discharge status: Home / Self Care | Payer: Medicare HMO | Source: Ambulatory Visit | Attending: Cardiology | Admitting: Cardiology

## 2016-10-16 DIAGNOSIS — I714 Abdominal aortic aneurysm, without rupture: Secondary | ICD-10-CM | POA: Diagnosis not present

## 2016-10-16 DIAGNOSIS — N182 Chronic kidney disease, stage 2 (mild): Secondary | ICD-10-CM | POA: Insufficient documentation

## 2016-10-16 DIAGNOSIS — E039 Hypothyroidism, unspecified: Secondary | ICD-10-CM | POA: Insufficient documentation

## 2016-10-16 DIAGNOSIS — Z955 Presence of coronary angioplasty implant and graft: Secondary | ICD-10-CM

## 2016-10-16 DIAGNOSIS — I129 Hypertensive chronic kidney disease with stage 1 through stage 4 chronic kidney disease, or unspecified chronic kidney disease: Secondary | ICD-10-CM | POA: Insufficient documentation

## 2016-10-16 DIAGNOSIS — Z6836 Body mass index (BMI) 36.0-36.9, adult: Secondary | ICD-10-CM | POA: Insufficient documentation

## 2016-10-16 DIAGNOSIS — I209 Angina pectoris, unspecified: Secondary | ICD-10-CM | POA: Diagnosis present

## 2016-10-16 DIAGNOSIS — I252 Old myocardial infarction: Secondary | ICD-10-CM | POA: Diagnosis not present

## 2016-10-16 DIAGNOSIS — E785 Hyperlipidemia, unspecified: Secondary | ICD-10-CM | POA: Diagnosis not present

## 2016-10-16 DIAGNOSIS — I2510 Atherosclerotic heart disease of native coronary artery without angina pectoris: Secondary | ICD-10-CM | POA: Insufficient documentation

## 2016-10-16 HISTORY — PX: CARDIAC CATHETERIZATION: SHX172

## 2016-10-16 LAB — POCT ACTIVATED CLOTTING TIME
ACTIVATED CLOTTING TIME: 356 s
Activated Clotting Time: 329 seconds

## 2016-10-16 SURGERY — LEFT HEART CATH AND CORONARY ANGIOGRAPHY
Anesthesia: LOCAL

## 2016-10-16 MED ORDER — CARVEDILOL 3.125 MG PO TABS
6.2500 mg | ORAL_TABLET | Freq: Two times a day (BID) | ORAL | Status: DC
  Administered 2016-10-16 – 2016-10-17 (×2): 6.25 mg via ORAL
  Filled 2016-10-16 (×2): qty 2

## 2016-10-16 MED ORDER — SODIUM CHLORIDE 0.9 % WEIGHT BASED INFUSION: 1.0000 mL/kg/h | INTRAVENOUS | Status: DC

## 2016-10-16 MED ORDER — SODIUM CHLORIDE 0.9% FLUSH: 3.0000 mL | Freq: Two times a day (BID) | INTRAVENOUS | Status: DC

## 2016-10-16 MED ORDER — MIDAZOLAM HCL 2 MG/2ML IJ SOLN
INTRAMUSCULAR | Status: AC
  Filled 2016-10-16: qty 2

## 2016-10-16 MED ORDER — ANGIOPLASTY BOOK
Freq: Once | Status: AC
  Administered 2016-10-16: 21:00:00
  Filled 2016-10-16: qty 1

## 2016-10-16 MED ORDER — MIDAZOLAM HCL 2 MG/2ML IJ SOLN
INTRAMUSCULAR | Status: DC | PRN
  Administered 2016-10-16: 1 mg via INTRAVENOUS

## 2016-10-16 MED ORDER — DEXTROSE 5 % IV SOLN
INTRAVENOUS | Status: AC
  Administered 2016-10-16: 09:00:00 via INTRAVENOUS
  Filled 2016-10-16: qty 500

## 2016-10-16 MED ORDER — SODIUM CHLORIDE 0.9 % IV SOLN: 250.0000 mL | INTRAVENOUS | Status: DC | PRN

## 2016-10-16 MED ORDER — ASPIRIN 81 MG PO CHEW
CHEWABLE_TABLET | ORAL | Status: AC
  Administered 2016-10-16: 81 mg via ORAL
  Filled 2016-10-16: qty 1

## 2016-10-16 MED ORDER — SODIUM CHLORIDE 0.9% FLUSH: 3.0000 mL | INTRAVENOUS | Status: DC | PRN

## 2016-10-16 MED ORDER — BIVALIRUDIN 250 MG IV SOLR
INTRAVENOUS | Status: AC
  Filled 2016-10-16: qty 250

## 2016-10-16 MED ORDER — LIDOCAINE HCL (PF) 1 % IJ SOLN
INTRAMUSCULAR | Status: DC | PRN
  Administered 2016-10-16: 15 mL

## 2016-10-16 MED ORDER — ACETAMINOPHEN 325 MG PO TABS: 650.0000 mg | ORAL_TABLET | ORAL | Status: DC | PRN

## 2016-10-16 MED ORDER — MORPHINE SULFATE (PF) 2 MG/ML IV SOLN: 1.0000 mg | INTRAVENOUS | Status: DC | PRN

## 2016-10-16 MED ORDER — LEVOTHYROXINE SODIUM 75 MCG PO TABS
75.0000 ug | ORAL_TABLET | Freq: Every day | ORAL | Status: DC
  Administered 2016-10-17: 75 ug via ORAL
  Filled 2016-10-16: qty 1

## 2016-10-16 MED ORDER — NITROGLYCERIN 1 MG/10 ML FOR IR/CATH LAB
INTRA_ARTERIAL | Status: DC | PRN
Start: 2016-10-16 — End: 2016-10-16
  Administered 2016-10-16: 150 ug via INTRACORONARY
  Administered 2016-10-16: 200 ug via INTRACORONARY
  Administered 2016-10-16: 150 ug via INTRACORONARY

## 2016-10-16 MED ORDER — ASPIRIN 81 MG PO CHEW
81.0000 mg | CHEWABLE_TABLET | ORAL | Status: AC
  Administered 2016-10-16: 81 mg via ORAL

## 2016-10-16 MED ORDER — NITROGLYCERIN 1 MG/10 ML FOR IR/CATH LAB
INTRA_ARTERIAL | Status: AC
  Filled 2016-10-16: qty 10

## 2016-10-16 MED ORDER — HEPARIN (PORCINE) IN NACL 2-0.9 UNIT/ML-% IJ SOLN
INTRAMUSCULAR | Status: DC | PRN
  Administered 2016-10-16: 1500 mL

## 2016-10-16 MED ORDER — SODIUM BICARBONATE BOLUS VIA INFUSION
INTRAVENOUS | Status: AC
  Administered 2016-10-16: 150 meq via INTRAVENOUS
  Filled 2016-10-16: qty 1

## 2016-10-16 MED ORDER — CLOPIDOGREL BISULFATE 75 MG PO TABS
ORAL_TABLET | ORAL | Status: AC
  Administered 2016-10-16: 75 mg via ORAL
  Filled 2016-10-16: qty 1

## 2016-10-16 MED ORDER — IOPAMIDOL (ISOVUE-370) INJECTION 76%
INTRAVENOUS | Status: AC
  Filled 2016-10-16: qty 100

## 2016-10-16 MED ORDER — IOPAMIDOL (ISOVUE-370) INJECTION 76%
INTRAVENOUS | Status: DC | PRN
  Administered 2016-10-16: 190 mL via INTRA_ARTERIAL

## 2016-10-16 MED ORDER — TICAGRELOR 90 MG PO TABS
90.0000 mg | ORAL_TABLET | Freq: Two times a day (BID) | ORAL | Status: DC
  Administered 2016-10-16 – 2016-10-17 (×2): 90 mg via ORAL
  Filled 2016-10-16 (×2): qty 1

## 2016-10-16 MED ORDER — SODIUM CHLORIDE 0.9 % WEIGHT BASED INFUSION
3.0000 mL/kg/h | INTRAVENOUS | Status: DC
  Administered 2016-10-16: 3 mL/kg/h via INTRAVENOUS

## 2016-10-16 MED ORDER — SODIUM CHLORIDE 0.9 % IV SOLN: INTRAVENOUS | Status: AC

## 2016-10-16 MED ORDER — NITROGLYCERIN IN D5W 200-5 MCG/ML-% IV SOLN
INTRAVENOUS | Status: AC
  Filled 2016-10-16: qty 250

## 2016-10-16 MED ORDER — NITROGLYCERIN IN D5W 200-5 MCG/ML-% IV SOLN
5.0000 ug/min | INTRAVENOUS | Status: DC
  Administered 2016-10-16: 5 ug/min via INTRAVENOUS

## 2016-10-16 MED ORDER — SODIUM BICARBONATE 8.4 % IV SOLN
INTRAVENOUS | Status: AC
  Filled 2016-10-16: qty 1000

## 2016-10-16 MED ORDER — LABETALOL HCL 5 MG/ML IV SOLN: 10.0000 mg | INTRAVENOUS | Status: AC | PRN

## 2016-10-16 MED ORDER — BIVALIRUDIN BOLUS VIA INFUSION - CUPID
INTRAVENOUS | Status: DC | PRN
  Administered 2016-10-16: 77.25 mg via INTRAVENOUS

## 2016-10-16 MED ORDER — HYDRALAZINE HCL 20 MG/ML IJ SOLN: 5.0000 mg | INTRAMUSCULAR | Status: AC | PRN

## 2016-10-16 MED ORDER — IOPAMIDOL (ISOVUE-370) INJECTION 76%
INTRAVENOUS | Status: AC
  Filled 2016-10-16: qty 50

## 2016-10-16 MED ORDER — TICAGRELOR 90 MG PO TABS
ORAL_TABLET | ORAL | Status: AC
  Filled 2016-10-16: qty 2

## 2016-10-16 MED ORDER — LIDOCAINE HCL (PF) 1 % IJ SOLN
INTRAMUSCULAR | Status: AC
  Filled 2016-10-16: qty 30

## 2016-10-16 MED ORDER — SODIUM CHLORIDE 0.9 % IV SOLN
INTRAVENOUS | Status: DC | PRN
  Administered 2016-10-16: 1.75 mg/kg/h via INTRAVENOUS
  Administered 2016-10-16: 11:00:00

## 2016-10-16 MED ORDER — HEPARIN (PORCINE) IN NACL 2-0.9 UNIT/ML-% IJ SOLN
INTRAMUSCULAR | Status: AC
  Filled 2016-10-16: qty 1500

## 2016-10-16 MED ORDER — ATORVASTATIN CALCIUM 80 MG PO TABS
80.0000 mg | ORAL_TABLET | Freq: Every day | ORAL | Status: DC
  Administered 2016-10-16: 19:00:00 80 mg via ORAL
  Filled 2016-10-16 (×2): qty 1

## 2016-10-16 MED ORDER — CLOPIDOGREL BISULFATE 75 MG PO TABS
75.0000 mg | ORAL_TABLET | Freq: Once | ORAL | Status: AC
  Administered 2016-10-16: 75 mg via ORAL

## 2016-10-16 MED ORDER — ONDANSETRON HCL 4 MG/2ML IJ SOLN
4.0000 mg | Freq: Four times a day (QID) | INTRAMUSCULAR | Status: DC | PRN
Start: 2016-10-16 — End: 2016-10-17

## 2016-10-16 MED ORDER — ASPIRIN EC 81 MG PO TBEC
81.0000 mg | DELAYED_RELEASE_TABLET | Freq: Every day | ORAL | Status: DC
  Administered 2016-10-17: 81 mg via ORAL
  Filled 2016-10-16 (×2): qty 1

## 2016-10-16 MED ORDER — TICAGRELOR 90 MG PO TABS
ORAL_TABLET | ORAL | Status: DC | PRN
  Administered 2016-10-16: 180 mg via ORAL

## 2016-10-16 SURGICAL SUPPLY — 21 items
BALLN EMERGE MR 2.5X12 (BALLOONS) ×2
BALLN EMERGE MR PUSH 1.5X15 (BALLOONS) ×2
BALLN ~~LOC~~ EMERGE MR 2.5X20 (BALLOONS) ×2
BALLN ~~LOC~~ EMERGE MR 2.75X20 (BALLOONS) ×2
BALLOON EMERGE MR 2.5X12 (BALLOONS) IMPLANT
BALLOON EMERGE MR PUSH 1.5X15 (BALLOONS) IMPLANT
BALLOON ~~LOC~~ EMERGE MR 2.5X20 (BALLOONS) IMPLANT
BALLOON ~~LOC~~ EMERGE MR 2.75X20 (BALLOONS) IMPLANT
CATH INFINITI 5FR MULTPACK ANG (CATHETERS) ×1 IMPLANT
GUIDE CATH MACH 1 7F VL3 (CATHETERS) ×1 IMPLANT
KIT ENCORE 26 ADVANTAGE (KITS) ×2 IMPLANT
KIT HEART LEFT (KITS) ×2 IMPLANT
PACK CARDIAC CATHETERIZATION (CUSTOM PROCEDURE TRAY) ×2 IMPLANT
SHEATH PINNACLE 5F 10CM (SHEATH) ×1 IMPLANT
SHEATH PINNACLE 7F 10CM (SHEATH) ×1 IMPLANT
STENT XIENCE ALPINE RX 2.5X23 (Permanent Stent) ×1 IMPLANT
STENT XIENCE ALPINE RX 2.5X8 (Permanent Stent) ×1 IMPLANT
TRANSDUCER W/STOPCOCK (MISCELLANEOUS) ×2 IMPLANT
WIRE PT2 LS 185 (WIRE) ×1 IMPLANT
WIRE PT2 MS 185 (WIRE) ×1 IMPLANT
WIRE RUNTHROUGH .014X180CM (WIRE) ×1 IMPLANT

## 2016-10-16 NOTE — Progress Notes (Signed)
Subjective:  Doing well. denies any chest pain or shortness of breath. Tolerated PCI to left circumflex/obtuse marginal this a.m. Objective:  Vital Signs in the last 24 hours: Temp:  [98 F (36.7 C)] 98 F (36.7 C) (01/11 1436) Pulse Rate:  [0-100] 68 (01/11 1630) Resp:  [0-39] 28 (01/11 1630) BP: (127-179)/(48-123) 127/48 (01/11 1630) SpO2:  [0 %-100 %] 96 % (01/11 1630) Weight:  [227 lb (103 kg)] 227 lb (103 kg) (01/11 0723)  Intake/Output from previous day: No intake/output data recorded. Intake/Output from this shift: Total I/O In: -  Out: 150 [Urine:150]  Physical Exam: Neck: no adenopathy, no carotid bruit, no JVD and supple, symmetrical, trachea midline Lungs: clear to auscultation bilaterally Heart: regular rate and rhythm, S1, S2 normal and Soft systolic murmur noted Abdomen: soft, non-tender; bowel sounds normal; no masses,  no organomegaly Extremities: extremities normal, atraumatic, no cyanosis or edema and Right groin no hematoma dressing dry  Lab Results: No results for input(s): WBC, HGB, PLT in the last 72 hours. No results for input(s): NA, K, CL, CO2, GLUCOSE, BUN, CREATININE in the last 72 hours. No results for input(s): TROPONINI in the last 72 hours.  Invalid input(s): CK, MB Hepatic Function Panel No results for input(s): PROT, ALBUMIN, AST, ALT, ALKPHOS, BILITOT, BILIDIR, IBILI in the last 72 hours. No results for input(s): CHOL in the last 72 hours. No results for input(s): PROTIME in the last 72 hours.  Imaging: Imaging results have been reviewed and No results found.  Cardiac Studies:  Assessment/Plan:  New-onset angina positive nuclear stress test status post left cath and PTCA stenting to left circumflex/obtuse marginal 1 with excellent angiographic results Coronary artery disease history of anteroseptal wall MI in February 2001 status post PCI to LAD in the past Hypertension Hyperlipidemia Hypothyroidism Morbid obesity Tobacco  abuse Abdominal aortic aneurysm Chronic kidney disease stage II Plan Continue present management Check labs in a.m. Phase I cardiac rehabilitation   LOS: 0 days    Rinaldo Cloud 10/16/2016, 4:46 PM

## 2016-10-16 NOTE — H&P (Signed)
printed H&P in the chart needs to be scanned

## 2016-10-16 NOTE — Progress Notes (Signed)
MEDICATION RELATED CONSULT NOTE - INITIAL   Pharmacy Consult for Sodium bicarbonate for CIN prevention in cardiac cath patient   Allergies  Allergen Reactions  . Quinapril Cough    Patient Measurements: Height: 5\' 6"  (167.6 cm) Weight: 227 lb (103 kg) IBW/kg (Calculated) : 63.8  Vital Signs: Temp: 98 F (36.7 C) (01/11 0723) Temp Source: Oral (01/11 0723) BP: 154/80 (01/11 0723) Pulse Rate: 72 (01/11 0723)  Labs:  Medical History: Past Medical History:  Diagnosis Date  . AAA (abdominal aortic aneurysm) (HCC)   . Hyperlipidemia   . Hypertension   . Hypothyroidism   . Myocardial infarction     Assessment: Pharmacy has been consulted to enter sodium bicarb orders for CIN prevention. Cath planned for today at 0900.   Plan:  Sodium bicarbonate infusion to start one hour prior to cath at 310 mL/hr x 1 hour as bolus, followed by 110 mL/hr continued through to 6 hours post procedure.   Thank you for allowing [place] to participate in this patients care. Signe Colt, PharmD 10/16/2016,7:37 AM

## 2016-10-16 NOTE — Progress Notes (Signed)
Site area: right groin  Site Prior to Removal:  Level 0  Pressure Applied For 20 MINUTES    Minutes Beginning at 1500  Manual:   Yes.    Patient Status During Pull:  Tolerated well, stable  Post Pull Groin Site:  Level 0  Post Pull Instructions Given:  Yes.    Post Pull Pulses Present:  Yes.    Dressing Applied:  Yes.    Comments:  Checked frequently during shift with no change in assessment

## 2016-10-16 NOTE — Interval H&P Note (Signed)
Cath Lab Visit (complete for each Cath Lab visit)  Clinical Evaluation Leading to the Procedure:   ACS: No.  Non-ACS:    Anginal Classification: CCS III  Anti-ischemic medical therapy: Maximal Therapy (2 or more classes of medications)  Non-Invasive Test Results: Intermediate-risk stress test findings: cardiac mortality 1-3%/year  Prior CABG: No previous CABG      History and Physical Interval Note:  10/16/2016 9:22 AM  Bryan Carter  has presented today for surgery, with the diagnosis of abnormal stress test, cp  The various methods of treatment have been discussed with the patient and family. After consideration of risks, benefits and other options for treatment, the patient has consented to  Procedure(s): Left Heart Cath and Coronary Angiography (N/A) as a surgical intervention .  The patient's history has been reviewed, patient examined, no change in status, stable for surgery.  I have reviewed the patient's chart and labs.  Questions were answered to the patient's satisfaction.     Rinaldo Cloud

## 2016-10-17 DIAGNOSIS — I252 Old myocardial infarction: Secondary | ICD-10-CM | POA: Diagnosis not present

## 2016-10-17 DIAGNOSIS — I2510 Atherosclerotic heart disease of native coronary artery without angina pectoris: Secondary | ICD-10-CM | POA: Diagnosis not present

## 2016-10-17 DIAGNOSIS — E039 Hypothyroidism, unspecified: Secondary | ICD-10-CM | POA: Diagnosis not present

## 2016-10-17 DIAGNOSIS — E785 Hyperlipidemia, unspecified: Secondary | ICD-10-CM | POA: Diagnosis not present

## 2016-10-17 LAB — BASIC METABOLIC PANEL
ANION GAP: 9 (ref 5–15)
BUN: 18 mg/dL (ref 6–20)
CHLORIDE: 108 mmol/L (ref 101–111)
CO2: 24 mmol/L (ref 22–32)
Calcium: 8.8 mg/dL — ABNORMAL LOW (ref 8.9–10.3)
Creatinine, Ser: 1.58 mg/dL — ABNORMAL HIGH (ref 0.61–1.24)
GFR calc non Af Amer: 41 mL/min — ABNORMAL LOW (ref >60)
GFR, EST AFRICAN AMERICAN: 47 mL/min — AB (ref >60)
GLUCOSE: 103 mg/dL — AB (ref 65–99)
POTASSIUM: 3.7 mmol/L (ref 3.5–5.1)
Sodium: 141 mmol/L (ref 135–145)

## 2016-10-17 LAB — CBC
HEMATOCRIT: 37.1 % — AB (ref 39.0–52.0)
HEMOGLOBIN: 12.6 g/dL — AB (ref 13.0–17.0)
MCH: 29.4 pg (ref 26.0–34.0)
MCHC: 34 g/dL (ref 30.0–36.0)
MCV: 86.7 fL (ref 78.0–100.0)
Platelets: 176 10*3/uL (ref 150–400)
RBC: 4.28 MIL/uL (ref 4.22–5.81)
RDW: 14 % (ref 11.5–15.5)
WBC: 5.3 10*3/uL (ref 4.0–10.5)

## 2016-10-17 MED ORDER — ATORVASTATIN CALCIUM 80 MG PO TABS: 80.0000 mg | ORAL_TABLET | Freq: Every day | ORAL | 3 refills | Status: DC

## 2016-10-17 MED ORDER — TICAGRELOR 90 MG PO TABS: 90.0000 mg | ORAL_TABLET | Freq: Two times a day (BID) | ORAL | 3 refills | Status: AC

## 2016-10-17 NOTE — Care Management Note (Addendum)
Case Management Note  Patient Details  Name: Bryan Carter MRN: [id number] Date of Birth: 1941/08/29  Subjective/Objective:    S/p coronary stent intervention, will be on brilinta, NCM awaiting benefit check.   NCM gave patient the 30 day savings card, he will go to [facility] on [address] to get 30 days free and they do have in stock.  Informed patient that his cardiologist also has brinlinta samples if he needs some.  He is for dc today.  PTA indep.               Action/Plan:   Expected Discharge Date:                  Expected Discharge Plan:  Home/Self Care  In-House Referral:     Discharge planning Services  CM Consult, Medication Assistance  Post Acute Care Choice:    Choice offered to:     DME Arranged:    DME Agency:     HH Arranged:    HH Agency:     Status of Service:  Completed, signed off  If discussed at [employer] of Stay Meetings, dates discussed:    Additional Comments:  Leone Haven, RN 10/17/2016, 11:07 AM

## 2016-10-17 NOTE — Discharge Summary (Signed)
Discharge summary dictated on 10/17/2016, dictation number is [phone]

## 2016-10-17 NOTE — Progress Notes (Signed)
/  Viona Gilmore LATONYA @ [facility] # [phone]   BRILINTA 90 MG BID 30 / 60 TAB   COVER- YES  CO-PAY- $ 242.00  ( DEDUCTIBLE NOT MET AND Q/L 2 PILL PER DAY )  TIER- 3 DRUG  PRIOR APPROVAL- NO  PHARMACY : [facility]

## 2016-10-17 NOTE — Discharge Instructions (Signed)
Coronary Angiogram With Stent °Coronary angiogram with stent placement is a procedure to widen or open a narrow blood vessel of the heart (coronary artery). Arteries may become blocked by cholesterol buildup (plaques) in the lining or wall. When a coronary artery becomes partially blocked, blood flow to that area decreases. This may lead to chest pain or a heart attack (myocardial infarction). °A stent is a small piece of metal that looks like mesh or a spring. Stent placement may be done as treatment for a heart attack or right after a coronary angiogram in which a blocked artery is found. °Let your health care provider know about: °· Any allergies you have. °· All medicines you are taking, including vitamins, herbs, eye drops, creams, and over-the-counter medicines. °· Any problems you or family members have had with anesthetic medicines. °· Any blood disorders you have. °· Any surgeries you have had. °· Any medical conditions you have. °· Whether you are pregnant or may be pregnant. °What are the risks? °Generally, this is a safe procedure. However, problems may occur, including: °· Damage to the heart or its blood vessels. °· A return of blockage. °· Bleeding, infection, or bruising at the insertion site. °· A collection of blood under the skin (hematoma) at the insertion site. °· A blood clot in another part of the body. °· Kidney injury. °· Allergic reaction to the dye or contrast that is used. °· Bleeding into the abdomen (retroperitoneal bleeding). ° °What happens before the procedure? °Staying hydrated °Follow instructions from your health care provider about hydration, which may include: °· Up to 2 hours before the procedure - you may continue to drink clear liquids, such as water, clear fruit juice, black coffee, and plain tea. ° °Eating and drinking restrictions °Follow instructions from your health care provider about eating and drinking, which may include: °· 8 hours before the procedure - stop eating  heavy meals or foods such as meat, fried foods, or fatty foods. °· 6 hours before the procedure - stop eating light meals or foods, such as toast or cereal. °· 2 hours before the procedure - stop drinking clear liquids. ° °Ask your health care provider about: °· Changing or stopping your regular medicines. This is especially important if you are taking diabetes medicines or blood thinners. °· Taking medicines such as ibuprofen. These medicines can thin your blood. Do not take these medicines before your procedure if your health care provider instructs you not to. Generally, aspirin is recommended before a procedure of passing a small, thin tube (catheter) through a blood vessel and into the heart (cardiac catheterization). ° °What happens during the procedure? °· An IV tube will be inserted into one of your veins. °· You will be given one or more of the following: °? A medicine to help you relax (sedative). °? A medicine to numb the area where the catheter will be inserted into an artery (local anesthetic). °· To reduce your risk of infection: °? Your health care team will wash or sanitize their hands. °? Your skin will be washed with soap. °? Hair may be removed from the area where the catheter will be inserted. °· Using a guide wire, the catheter will be inserted into an artery. The location may be in your groin, in your wrist, or in the fold of your arm (near your elbow). °· A type of X-ray (fluoroscopy) will be used to help guide the catheter to the opening of the arteries in the heart. °· A   dye will be injected into the catheter, and X-rays will be taken. The dye will help to show where any narrowing or blockages are located in the arteries. °· A tiny wire will be guided to the blocked spot, and a balloon will be inflated to make the artery wider. °· The stent will be expanded and will crush the plaques into the wall of the vessel. The stent will hold the area open and improve the blood flow. Most stents have a  drug coating to reduce the risk of the stent narrowing over time. °· The artery may be made wider using a drill, laser, or other tools to remove plaques. °· When the blood flow is better, the catheter will be removed. The lining of the artery will grow over the stent, which stays where it was placed. °This procedure may vary among health care providers and hospitals. °What happens after the procedure? °· If the procedure is done through the leg, you will be kept in bed lying flat for about 6 hours. You will be instructed to not bend and not cross your legs. °· The insertion site will be checked frequently. °· The pulse in your foot or wrist will be checked frequently. °· You may have additional blood tests, X-rays, and a test that records the electrical activity of your heart (electrocardiogram, or ECG). °This information is not intended to replace advice given to you by your health care provider. Make sure you discuss any questions you have with your health care provider. °Document Released: 03/29/2003 Document Revised: 05/22/2016 Document Reviewed: 04/27/2016 °Elsevier Interactive Patient Education © 2017 [employer] ° °

## 2016-10-17 NOTE — Progress Notes (Signed)
CARDIAC REHAB PHASE I   PRE:  Rate/Rhythm: 74 SR  BP:  Supine: 163/83  Sitting:   Standing:    SaO2:   MODE:  Ambulation: 540 ft   POST:  Rate/Rhythm: 88 SR  BP:  Supine:   Sitting: 182/74    After rest 160/73  Standing:    SaO2:  0830-0917 Pt walked 540 ft on RA with steady gait. No CP. Tolerated well except elevated BP. A little SOB but he stated he had not walked in few days.  BP down after rest. Stressed importance of brilinta with stent. Needs to see case manager. Discussed NTG use, healthy food choices, ex ed and CRP 2. Pt stated he has attended CRP 2 before and doubts he will do again as he goes to the gym at least 4 times a week. Will refer to [place]. Gave brochure.    Luetta Nutting, RN BSN  10/17/2016 9:13 AM

## 2016-10-18 NOTE — Discharge Summary (Signed)
NAMEJONTHOMAS, ANAGNOS                ACCOUNT NO.:  [id number]  MEDICAL RECORD NO.:  [id number]  LOCATION:                                 FACILITY:  PHYSICIAN:  Melani Brisbane N. Sharyn Lull, M.D. DATE OF BIRTH:  November 27, 1940  DATE OF ADMISSION:  10/16/2016 DATE OF DISCHARGE:  10/17/2016                              DISCHARGE SUMMARY   ADMITTING DIAGNOSES: 1. New-onset angina. 2. Positive nuclear stress test, rule out progression of coronary     artery disease. 3. 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 4. Hypertension. 5. Hyperlipidemia. 6. Hypothyroidism. 7. Morbid obesity. 8. Tobacco abuse. 9. Chronic kidney disease.  DISCHARGE DIAGNOSES: 1. Stable angina, status post left catheterization/percutaneous     transluminal coronary angioplasty stenting to left     circumflex/obtuse marginal. 2. Coronary artery disease, history of anteroseptal wall myocardial     infarction in February 2001.  Noted to have patent stent with mild     in-stent restenosis. 3. Hypertension. 4. Hyperlipidemia. 5. Hypothyroidism. 6. Morbid obesity. 7. Tobacco abuse. 8. Chronic kidney disease, stage II.  DISCHARGE HOME MEDICATIONS: 1. Brilinta 90 mg 1 tablet twice daily. 2. Amlodipine 5 mg 1 tablet daily. 3. Aspirin 81 mg 1 tablet daily. 4. Carvedilol 6.25 mg twice daily. 5. Levothyroxine 75 mcg daily. 6. Losartan 100 mg daily. 7. Atorvastatin 80 mg daily. 8. Nitrostat sublingual p.r.n. Patient has been advised to stop clopidogrel, furosemide, hydrochlorothiazide, and potassium for now.  DIET:  Low salt, low cholesterol.  ACTIVITY:  Increase activity slowly as tolerated.  The patient will be scheduled for phase 2 cardiac rehab as outpatient.  CONDITION AT DISCHARGE:  Stable.  Post cardiac cath instructions have been given.  BRIEF HISTORY AND HOSPITAL COURSE:   Mr. Vandall is a 76 year old male with past medical history significant for coronary artery disease, history of anteroseptal wall myocardial infarction in February 2001 requiring PTCA stenting to LAD, hypertension, hyperlipidemia, hypothyroidism, morbid obesity, abdominal aortic aneurysm, remote tobacco abuse, positive family history of coronary artery disease, chronic kidney disease, complained of retrosternal chest tightness associated with shortness of breath with minimal exertion.  The patient denies any nausea, vomiting, or diaphoresis.  Denies PND, orthopnea, or leg swelling.  Denies palpitation, lightheadedness, or syncope.  Patient recently underwent Lexiscan Myoview which showed inferior and inferolateral wall ischemia with EF of 46%.  PHYSICAL EXAMINATION:  GENERAL:  He was alert, awake, oriented x3, in no acute distress. VITAL SIGNS:  Blood pressure was 146/95, pulse 63 and regular. HEENT:  Conjunctivae are pink. NECK:  Supple.  No JVD.  No bruits. LUNGS:  Clear to auscultation without rhonchi or rales. CARDIOVASCULAR:  S1, S2 were normal.  There was no S3 gallop. ABDOMEN:  Soft.  Bowel sounds are present.  Nontender. EXTREMITIES:  There is no clubbing, cyanosis, or edema.  LABORATORY DATA:  Sodium was 141, potassium 3.7, BUN 18, creatinine 1.58 which has come down from 1.76.  Hemoglobin is 12.6, hematocrit 37.1. Glucose 103.  EKG showed no acute ischemic changes.  BRIEF HOSPITAL COURSE:  The  patient was a.m. admit and underwent left cardiac cath/PTCA stenting to bifurcation proximal left circumflex/obtuse marginal.  As per procedure report, patient tolerated procedure well.  Post procedure, the patient did not have any episodes of chest pain during the hospital stay.  His groin is stable with no evidence of hematoma or bruit.  The patient is ambulating in room without any problems.  The patient will be discharged home on above medications and will be followed up in my  office in 1 week.  The patient will be scheduled for phase 2 cardiac rehab as outpatient.  The patient has been advised to drink plenty of fluids for the next few days.     Eduardo Osier. Sharyn Lull, M.D.     M[facility]/MEDQ  D:  10/17/2016  T:  10/18/2016  Job:  [id number]

## 2016-10-28 ENCOUNTER — Encounter (HOSPITAL_COMMUNITY): Payer: Self-pay

## 2016-10-28 ENCOUNTER — Encounter (HOSPITAL_COMMUNITY)
Admission: RE | Admit: 2016-10-28 | Discharge: 2016-10-28 | Disposition: A | Discharge status: Home / Self Care | Payer: Medicare HMO | Source: Ambulatory Visit | Attending: Cardiology | Admitting: Cardiology

## 2016-10-28 VITALS — BP 140/77 | HR 74 | Ht 66.0 in | Wt 231.5 lb

## 2016-10-28 DIAGNOSIS — Z955 Presence of coronary angioplasty implant and graft: Secondary | ICD-10-CM | POA: Diagnosis not present

## 2016-10-28 HISTORY — DX: Atherosclerotic heart disease of native coronary artery without angina pectoris: I25.10

## 2016-10-28 NOTE — Progress Notes (Signed)
Cardiac Individual Treatment Plan  Patient Details  Name: OAKLYN MANS MRN: [id number] Date of Birth: Feb 21, 1941 Referring Provider:   Flowsheet Row CARDIAC REHAB PHASE II ORIENTATION from 10/28/2016 in [facility] CARDIAC REHAB  Referring Provider  Rinaldo Cloud MD      Initial Encounter Date:  Flowsheet Row CARDIAC REHAB PHASE II ORIENTATION from 10/28/2016 in [facility] CARDIAC REHAB  Date  10/28/16  Referring Provider  Rinaldo Cloud MD      Visit Diagnosis: 10/16/16 Stented coronary artery  Patient's Home Medications on Admission:  Current Outpatient Prescriptions:  .  amLODipine (NORVASC) 5 MG tablet, Take 5 mg by mouth daily., Disp: , Rfl:  .  aspirin 81 MG tablet, Take 81 mg by mouth daily., Disp: , Rfl:  .  atorvastatin (LIPITOR) 80 MG tablet, Take 1 tablet (80 mg total) by mouth at bedtime., Disp: 30 tablet, Rfl: 3 .  carvedilol (COREG) 6.25 MG tablet, Take 1 tablet by mouth 2 (two) times daily., Disp: , Rfl:  .  levothyroxine (SYNTHROID, LEVOTHROID) 75 MCG tablet, Take 75 mcg by mouth daily before breakfast., Disp: , Rfl:  .  losartan (COZAAR) 100 MG tablet, Take 100 mg by mouth daily., Disp: , Rfl:  .  ticagrelor (BRILINTA) 90 MG TABS tablet, Take 1 tablet (90 mg total) by mouth 2 (two) times daily., Disp: 60 tablet, Rfl: 3  Past Medical History: Past Medical History:  Diagnosis Date  . AAA (abdominal aortic aneurysm) (HCC)   . Coronary artery disease   . Hyperlipidemia   . Hypertension   . Hypothyroidism   . Myocardial infarction     Tobacco Use: History  Smoking Status  . Former Smoker  . Packs/day: 1.00  . Years: 50.00  . Types: Cigarettes  . Quit date: 10/06/2006  Smokeless Tobacco  . Never Used    Labs: Recent Review Flowsheet Data    There is no flowsheet data to display.      Capillary Blood Glucose: No results found for: GLUCAP   Exercise Target Goals: Date: 10/28/16  Exercise Program  Goal: Individual exercise prescription set with THRR, safety & activity barriers. Participant demonstrates ability to understand and report RPE using BORG scale, to self-measure pulse accurately, and to acknowledge the importance of the exercise prescription.  Exercise Prescription Goal: Starting with aerobic activity 30 plus minutes a day, 3 days per week for initial exercise prescription. Provide home exercise prescription and guidelines that participant acknowledges understanding prior to discharge.  Activity Barriers & Risk Stratification:     Activity Barriers & Cardiac Risk Stratification - 10/28/16 1028      Activity Barriers & Cardiac Risk Stratification   Cardiac Risk Stratification High      6 Minute Walk:     6 Minute Walk    Row Name 10/28/16 1002 10/28/16 1203       6 Minute Walk   Phase Initial  -    Distance 1175 feet  -    Walk Time 5.31 minutes  -    # of Rest Breaks 0 1  29 second rest break at the end of the walk test    MPH 2.51  -    METS 2.57  -    RPE 13  -    VO2 Peak 8.98  -    Symptoms Yes (comment)  -    Comments fatigue  -    Resting HR 74 bpm  -    Resting BP  140/77  -    Max Ex. HR 130 bpm  -    Max Ex. BP 182/80  -    2 Minute Post BP 142/72  -       Initial Exercise Prescription:     Initial Exercise Prescription - 10/28/16 1000      Date of Initial Exercise RX and Referring Provider   Date 10/28/16   Referring Provider Rinaldo Cloud MD     Treadmill   MPH 1.8   Grade 0   Minutes 10   METs 2.38     Recumbant Bike   Level 2   Minutes 10   METs 2     NuStep   Level 2   Minutes 10   METs 1.5     Prescription Details   Frequency (times per week) 3   Duration Progress to 30 minutes of continuous aerobic without signs/symptoms of physical distress     Intensity   THRR 40-80% of Max Heartrate 58-115   Ratings of Perceived Exertion 11-13   Perceived Dyspnea 0-4     Progression   Progression Continue to progress  workloads to maintain intensity without signs/symptoms of physical distress.     Resistance Training   Training Prescription Yes   Weight 2lbs   Reps 10-12      Perform Capillary Blood Glucose checks as needed.  Exercise Prescription Changes:   Exercise Comments:   Discharge Exercise Prescription (Final Exercise Prescription Changes):   Nutrition:  Target Goals: Understanding of nutrition guidelines, daily intake of sodium 1500mg , cholesterol 200mg , calories 30% from fat and 7% or less from saturated fats, daily to have 5 or more servings of fruits and vegetables.  Biometrics:     Pre Biometrics - 10/28/16 1009      Pre Biometrics   Waist Circumference 49.25 inches   Hip Circumference 43.5 inches   Waist to Hip Ratio 1.13 %   Triceps Skinfold 16 mm   % Body Fat 36.4 %   Grip Strength 39 kg   Flexibility 0 in   Single Leg Stand 6.84 seconds       Nutrition Therapy Plan and Nutrition Goals:   Nutrition Discharge: Nutrition Scores:   Nutrition Goals Re-Evaluation:   Psychosocial: Target Goals: Acknowledge presence or absence of depression, maximize coping skills, provide positive support system. Participant is able to verbalize types and ability to use techniques and skills needed for reducing stress and depression.  Initial Review & Psychosocial Screening:     Initial Psych Review & Screening - 10/28/16 1255      Family Dynamics   Good Support System? Yes   Comments Initial assessment reveals no indentifiable barriers or further interventions needed at this time.     Barriers   Psychosocial barriers to participate in program There are no identifiable barriers or psychosocial needs.     Screening Interventions   Interventions Encouraged to exercise      Quality of Life Scores:     Quality of Life - 10/28/16 1016      Quality of Life Scores   Health/Function Pre 25.04 %   Socioeconomic Pre 24.88 %   Psych/Spiritual Pre 29.14 %   Family Pre  24.75 %   GLOBAL Pre 26 %      PHQ-9: Recent Review Flowsheet Data    There is no flowsheet data to display.      Psychosocial Evaluation and Intervention:   Psychosocial Re-Evaluation:   Vocational Rehabilitation: Provide vocational rehab  assistance to qualifying candidates.   Vocational Rehab Evaluation & Intervention:     Vocational Rehab - 10/28/16 1254      Initial Vocational Rehab Evaluation & Intervention   Assessment shows need for Vocational Rehabilitation No      Education: Education Goals: Education classes will be provided on a weekly basis, covering required topics. Participant will state understanding/return demonstration of topics presented.  Learning Barriers/Preferences:     Learning Barriers/Preferences - 10/28/16 0919      Learning Barriers/Preferences   Learning Barriers Sight   Learning Preferences Video;Pictoral;Computer/Internet      Education Topics: Count Your Pulse:  -Group instruction provided by verbal instruction, demonstration, patient participation and written materials to support subject.  Instructors address importance of being able to find your pulse and how to count your pulse when at home without a heart monitor.  Patients get hands on experience counting their pulse with staff help and individually.   Heart Attack, Angina, and Risk Factor Modification:  -Group instruction provided by verbal instruction, video, and written materials to support subject.  Instructors address signs and symptoms of angina and heart attacks.    Also discuss risk factors for heart disease and how to make changes to improve heart health risk factors.   Functional Fitness:  -Group instruction provided by verbal instruction, demonstration, patient participation, and written materials to support subject.  Instructors address safety measures for doing things around the house.  Discuss how to get up and down off the floor, how to pick things up properly,  how to safely get out of a chair without assistance, and balance training.   Meditation and Mindfulness:  -Group instruction provided by verbal instruction, patient participation, and written materials to support subject.  Instructor addresses importance of mindfulness and meditation practice to help reduce stress and improve awareness.  Instructor also leads participants through a meditation exercise.    Stretching for Flexibility and Mobility:  -Group instruction provided by verbal instruction, patient participation, and written materials to support subject.  Instructors lead participants through series of stretches that are designed to increase flexibility thus improving mobility.  These stretches are additional exercise for major muscle groups that are typically performed during regular warm up and cool down.   Hands Only CPR Anytime:  -Group instruction provided by verbal instruction, video, patient participation and written materials to support subject.  Instructors co-teach with AHA video for hands only CPR.  Participants get hands on experience with mannequins.   Nutrition I class: Heart Healthy Eating:  -Group instruction provided by PowerPoint slides, verbal discussion, and written materials to support subject matter. The instructor gives an explanation and review of the Therapeutic Lifestyle Changes diet recommendations, which includes a discussion on lipid goals, dietary fat, sodium, fiber, plant stanol/sterol esters, sugar, and the components of a well-balanced, healthy diet.   Nutrition II class: Lifestyle Skills:  -Group instruction provided by PowerPoint slides, verbal discussion, and written materials to support subject matter. The instructor gives an explanation and review of label reading, grocery shopping for heart health, heart healthy recipe modifications, and ways to make healthier choices when eating out.   Diabetes Question & Answer:  -Group instruction provided by  PowerPoint slides, verbal discussion, and written materials to support subject matter. The instructor gives an explanation and review of diabetes co-morbidities, pre- and post-prandial blood glucose goals, pre-exercise blood glucose goals, signs, symptoms, and treatment of hypoglycemia and hyperglycemia, and foot care basics.   Diabetes Blitz:  -Group instruction provided by  PowerPoint slides, verbal discussion, and written materials to support subject matter. The instructor gives an explanation and review of the physiology behind type 1 and type 2 diabetes, diabetes medications and rational behind using different medications, pre- and post-prandial blood glucose recommendations and Hemoglobin A1c goals, diabetes diet, and exercise including blood glucose guidelines for exercising safely.    Portion Distortion:  -Group instruction provided by PowerPoint slides, verbal discussion, written materials, and food models to support subject matter. The instructor gives an explanation of serving size versus portion size, changes in portions sizes over the last 20 years, and what consists of a serving from each food group.   Stress Management:  -Group instruction provided by verbal instruction, video, and written materials to support subject matter.  Instructors review role of stress in heart disease and how to cope with stress positively.     Exercising on Your Own:  -Group instruction provided by verbal instruction, power point, and written materials to support subject.  Instructors discuss benefits of exercise, components of exercise, frequency and intensity of exercise, and end points for exercise.  Also discuss use of nitroglycerin and activating EMS.  Review options of places to exercise outside of rehab.  Review guidelines for sex with heart disease.   Cardiac Drugs I:  -Group instruction provided by verbal instruction and written materials to support subject.  Instructor reviews cardiac drug  classes: antiplatelets, anticoagulants, beta blockers, and statins.  Instructor discusses reasons, side effects, and lifestyle considerations for each drug class.   Cardiac Drugs II:  -Group instruction provided by verbal instruction and written materials to support subject.  Instructor reviews cardiac drug classes: angiotensin converting enzyme inhibitors (ACE-I), angiotensin II receptor blockers (ARBs), nitrates, and calcium channel blockers.  Instructor discusses reasons, side effects, and lifestyle considerations for each drug class.   Anatomy and Physiology of the Circulatory System:  -Group instruction provided by verbal instruction, video, and written materials to support subject.  Reviews functional anatomy of heart, how it relates to various diagnoses, and what role the heart plays in the overall system.   Knowledge Questionnaire Score:     Knowledge Questionnaire Score - 10/28/16 1011      Knowledge Questionnaire Score   Pre Score 15/24      Core Components/Risk Factors/Patient Goals at Admission:     Personal Goals and Risk Factors at Admission - 10/28/16 1021      Core Components/Risk Factors/Patient Goals on Admission    Weight Management Yes;Obesity;Weight Loss   Intervention Weight Management: Develop a combined nutrition and exercise program designed to reach desired caloric intake, while maintaining appropriate intake of nutrient and fiber, sodium and fats, and appropriate energy expenditure required for the weight goal.;Weight Management: Provide education and appropriate resources to help participant work on and attain dietary goals.;Weight Management/Obesity: Establish reasonable short term and long term weight goals.;Obesity: Provide education and appropriate resources to help participant work on and attain dietary goals.   Expected Outcomes Short Term: Continue to assess and modify interventions until short term weight is achieved;Weight Maintenance: Understanding  of the daily nutrition guidelines, which includes 25-35% calories from fat, 7% or less cal from saturated fats, less than 200mg  cholesterol, less than 1.5gm of sodium, & 5 or more servings of fruits and vegetables daily;Weight Loss: Understanding of general recommendations for a balanced deficit meal plan, which promotes 1-2 lb weight loss per week and includes a negative energy balance of [phone] kcal/d;Understanding recommendations for meals to include 15-35% energy as protein, 25-35% energy  from fat, 35-60% energy from carbohydrates, less than 200mg  of dietary cholesterol, 20-35 gm of total fiber daily;Understanding of distribution of calorie intake throughout the day with the consumption of 4-5 meals/snacks   Increase Strength and Stamina Yes   Intervention Provide advice, education, support and counseling about physical activity/exercise needs.;Develop an individualized exercise prescription for aerobic and resistive training based on initial evaluation findings, risk stratification, comorbidities and participant's personal goals.   Expected Outcomes Achievement of increased cardiorespiratory fitness and enhanced flexibility, muscular endurance and strength shown through measurements of functional capacity and personal statement of participant.   Hypertension Yes   Intervention Provide education on lifestyle modifcations including regular physical activity/exercise, weight management, moderate sodium restriction and increased consumption of fresh fruit, vegetables, and low fat dairy, alcohol moderation, and smoking cessation.;Monitor prescription use compliance.   Expected Outcomes Long Term: Maintenance of blood pressure at goal levels.;Short Term: Continued assessment and intervention until BP is < 140/28mm HG in hypertensive participants. < 130/62mm HG in hypertensive participants with diabetes, heart failure or chronic kidney disease.   Lipids Yes   Intervention Provide education and support for  participant on nutrition & aerobic/resistive exercise along with prescribed medications to achieve LDL 70mg , HDL >40mg .   Expected Outcomes Short Term: Participant states understanding of desired cholesterol values and is compliant with medications prescribed. Participant is following exercise prescription and nutrition guidelines.;Long Term: Cholesterol controlled with medications as prescribed, with individualized exercise RX and with personalized nutrition plan. Value goals: LDL < 70mg , HDL > 40 mg.   Personal Goal Other Yes   Personal Goal Improve exercise tolerance to be able to go back to [facility] and lose weight. Goal wt at 200lbs   Intervention Provide nutrition counseling and exercise programming to assist with losing weight. Also provide exercise program and regimen to prepare for building tolerance to return to [facility].   Expected Outcomes Pt will be able to return to [facility] and lose weight.      Core Components/Risk Factors/Patient Goals Review:    Core Components/Risk Factors/Patient Goals at Discharge (Final Review):    ITP Comments:     ITP Comments    Row Name 10/28/16 0917           ITP Comments Dr. Armanda Magic, Medical Director          Comments:Patient attended orientation from 0800 to 1000 to review rules and guidelines for program. Completed 6 minute walk test, Intitial ITP, and exercise prescription. . Telemetry-Sinus Rhythm. Mr Debruhl stopped his walk test at 5 minutes and 31 seconds due to fatigue and shortness of breath. Mr Mcgreal's blood pressure was elevated at the end fo his walk test. Dr Sharyn Lull was called and notified.Exit blood pressure 138/70. Heart rate 70. Will continue to monitor blood pressure.Will fax exercise flow sheets with today's blood pressures to Dr. Annitta Jersey office for Arbutus Ped, RN,BSN 10/28/2016 1:36 PM

## 2016-10-28 NOTE — Progress Notes (Signed)
Cardiac Rehab Medication Review by a Pharmacist  Does the patient  feel that his/her medications are working for him/her?  yes  Has the patient been experiencing any side effects to the medications prescribed?  no  Does the patient measure his/her own blood pressure or blood glucose at home?  yes   Does the patient have any problems obtaining medications due to transportation or finances?   no  Understanding of regimen: fair Understanding of indications: fair Potential of compliance: fair  Pharmacist comments: Bryan Carter is 77 year old male who presents to cardiac rehab today in good spirits. He stated that his daughter is a Engineer, civil (consulting) and helps him with his medication regimen. She sets up pill boxes for him and his wife.   Bryan Carter says that he measures his blood pressure every morning, and it usually is around 140's/80's. He was unsure of his exact goal, but said that he is working to get it "lower than that." He denied any dizziness with blood pressure medications.   Carylon Perches, PharmD Acute Care Pharmacy Resident  Pager: [phone] 10/28/2016

## 2016-10-28 NOTE — Progress Notes (Signed)
Mr Escalon is here today for orientation. Pre exercise blood pressure 140/77. Heart rate 74. Mr Sommerfeld reports that he did not take his medication this morning. Mr Hauer completed his walk test and had to stop at 5 minutes and 31 seconds due to fatigue and shortness of breath. Blood pressure noted at 182/80 with a max heart rate of 130. Repeat blood pressure 158/70. Dr Sharyn Lull called and notified about today's blood pressure elevations. Dr Sharyn Lull gave an update on Mr Ricco's AAA parameters. Will continue to monitor the patient throughout  the program.Exit blood pressure 138/70. Mr Manners plans to go home to take his medications and knows to take his medication prior to coming to exercise at cardiac rehab next week.Gladstone Lighter, RN,BSN 10/28/2016 12:05 PM

## 2016-10-31 ENCOUNTER — Telehealth (HOSPITAL_COMMUNITY): Payer: Self-pay | Admitting: Cardiac Rehabilitation

## 2016-10-31 NOTE — Telephone Encounter (Signed)
-----   Message from Nada Libman, MD sent at 10/29/2016  5:30 PM EST ----- Regarding: RE: cardiac rehab  OK to proceed.  No restrictions ----- Message ----- From: Robyne Peers, RN Sent: 10/27/2016  10:18 AM To: Nada Libman, MD Subject: cardiac rehab                                  Dear Dr. Myra Gianotti,  Pt with 4.18cm AAA is scheduled to begin cardiac rehab.  Are there any contraindications to exercise?  Please indicate activity restrictions and blood pressure parameters.   Thank you, Deveron Furlong, RN, BSN Cardiac Pulmonary Rehab

## 2016-11-03 ENCOUNTER — Encounter (HOSPITAL_COMMUNITY)
Admission: RE | Admit: 2016-11-03 | Discharge: 2016-11-03 | Disposition: A | Discharge status: Home / Self Care | Payer: Medicare HMO | Source: Ambulatory Visit | Attending: Cardiology | Admitting: Cardiology

## 2016-11-03 DIAGNOSIS — Z955 Presence of coronary angioplasty implant and graft: Secondary | ICD-10-CM

## 2016-11-03 NOTE — Progress Notes (Signed)
Daily Session Note  Patient Details  Name: Bryan Carter MRN: [id number] Date of Birth: 1941-02-13 Referring Provider:   Flowsheet Row CARDIAC REHAB PHASE II ORIENTATION from 10/28/2016 in [facility]  Referring Provider  Charolette Forward MD      Encounter Date: 11/03/2016  Check In:   Capillary Blood Glucose: No results found for this or any previous visit (from the past 24 hour(s)).   Goals Met:  No report of cardiac concerns or symptoms  Goals Unmet:  Blood pressure  Comments: Pt started cardiac rehab today.  Pt tolerated light exercise without difficulty., telemetry-Sinus Rhythm, asymptomatic.  Medication list reconciled. Pt denies barriers to medicaiton compliance.  PSYCHOSOCIAL ASSESSMENT:  PHQ-0. Pt exhibits positive coping skills, hopeful outlook with supportive family. No psychosocial needs identified at this time, no psychosocial interventions necessary.    Pt enjoys travelling .   Pt oriented to exercise equipment and routine.    Understanding verbalized. [name]'s blood pressure was noted at 140/80 resting with a heart rate of 68. Blood pressure then was noted at 160/86 on the treadmill. Systolic blood pressure was noted in the 150's the duration of exercise.Exit blood pressure 150/86 with a recheck blood pressure 138/62. Dr Terrence Dupont called and notified. Dr Terrence Dupont increased [name]'s amlodipine to 5 mg twice a day. Will continue to monitor BP.Will fax exercise flow sheets to Dr. Zenia Resides office for review.Barnet Pall, RN,BSN 11/03/2016 11:14 AM   Dr. Fransico Him is Medical Director for Cardiac Rehab at [facility].

## 2016-11-05 ENCOUNTER — Encounter (HOSPITAL_COMMUNITY)
Admission: RE | Admit: 2016-11-05 | Discharge: 2016-11-05 | Disposition: A | Discharge status: Home / Self Care | Payer: Medicare HMO | Source: Ambulatory Visit | Attending: Cardiology | Admitting: Cardiology

## 2016-11-05 DIAGNOSIS — Z955 Presence of coronary angioplasty implant and graft: Secondary | ICD-10-CM | POA: Diagnosis not present

## 2016-11-05 NOTE — Progress Notes (Signed)
Daily Session Note  Patient Details  Name: Bryan Carter MRN: [id number] Date of Birth: 1941-05-22 Referring Provider:   Flowsheet Row CARDIAC REHAB PHASE II ORIENTATION from 10/28/2016 in [facility]  Referring Provider  Charolette Forward MD      Encounter Date: 11/05/2016  Check In:   Capillary Blood Glucose: No results found for this or any previous visit (from the past 24 hour(s)).   Goals Met:  No report of cardiac concerns or symptoms  Goals Unmet:  BP  Comments: Blood pressures remain elevated today with exercise. Maximum blood pressure 180/90 on the treadmill. Patient moved from the treadmill to the track. Recheck blood pressure 162/60 on the track. I had Mr Mullane sit down to rest. Recheck blood pressure 158/70. Post exercise blood pressure 138/70 the 128/70. Dr Terrence Dupont called and notified about today's blood pressure elevations. Dr Terrence Dupont increased Mr Havens's parameter to 160/90 with exercise. Mr Lightner will hold on coming to exercise to give his medication increase a few more days to take effect. Vyncent plans to return to exercise on Monday.Will continue to monitor the patient throughout  the program.Alahia Whicker Venetia Maxon, RN,BSN 11/05/2016 5:16 PM    Dr. Fransico Him is Medical Director for Cardiac Rehab at [facility].

## 2016-11-05 NOTE — Progress Notes (Signed)
Cardiac Individual Treatment Plan  Patient Details  Name: Bryan Carter MRN: [id number] Date of Birth: [birth year] Referring Provider:   Flowsheet Row CARDIAC REHAB PHASE II ORIENTATION from 10/28/2016 in [facility] CARDIAC REHAB  Referring Provider  Rinaldo Cloud MD      Initial Encounter Date:  Flowsheet Row CARDIAC REHAB PHASE II ORIENTATION from 10/28/2016 in [facility] CARDIAC REHAB  Date  10/28/16  Referring Provider  Rinaldo Cloud MD      Visit Diagnosis: 10/16/16 Stented coronary artery  Patient's Home Medications on Admission:  Current Outpatient Prescriptions:  .  amLODipine (NORVASC) 5 MG tablet, Take 5 mg by mouth daily., Disp: , Rfl:  .  aspirin 81 MG tablet, Take 81 mg by mouth daily., Disp: , Rfl:  .  atorvastatin (LIPITOR) 80 MG tablet, Take 1 tablet (80 mg total) by mouth at bedtime., Disp: 30 tablet, Rfl: 3 .  carvedilol (COREG) 6.25 MG tablet, Take 1 tablet by mouth 2 (two) times daily., Disp: , Rfl:  .  levothyroxine (SYNTHROID, LEVOTHROID) 75 MCG tablet, Take 75 mcg by mouth daily before breakfast., Disp: , Rfl:  .  losartan (COZAAR) 100 MG tablet, Take 100 mg by mouth daily., Disp: , Rfl:  .  ticagrelor (BRILINTA) 90 MG TABS tablet, Take 1 tablet (90 mg total) by mouth 2 (two) times daily., Disp: 60 tablet, Rfl: 3  Past Medical History: Past Medical History:  Diagnosis Date  . AAA (abdominal aortic aneurysm) (HCC)   . Coronary artery disease   . Hyperlipidemia   . Hypertension   . Hypothyroidism   . Myocardial infarction     Tobacco Use: History  Smoking Status  . Former Smoker  . Packs/day: 1.00  . Years: 50.00  . Types: Cigarettes  . Quit date: 10/06/2006  Smokeless Tobacco  . Never Used    Labs: Recent Review Flowsheet Data    There is no flowsheet data to display.      Capillary Blood Glucose: No results found for: GLUCAP   Exercise Target Goals:    Exercise Program Goal: Individual exercise  prescription set with THRR, safety & activity barriers. Participant demonstrates ability to understand and report RPE using BORG scale, to self-measure pulse accurately, and to acknowledge the importance of the exercise prescription.  Exercise Prescription Goal: Starting with aerobic activity 30 plus minutes a day, 3 days per week for initial exercise prescription. Provide home exercise prescription and guidelines that participant acknowledges understanding prior to discharge.  Activity Barriers & Risk Stratification:     Activity Barriers & Cardiac Risk Stratification - 10/28/16 1028      Activity Barriers & Cardiac Risk Stratification   Cardiac Risk Stratification High      6 Minute Walk:     6 Minute Walk    Row Name 10/28/16 1002 10/28/16 1203       6 Minute Walk   Phase Initial  -    Distance 1175 feet  -    Walk Time 5.31 minutes  -    # of Rest Breaks 0 1  29 second rest break at the end of the walk test    MPH 2.51  -    METS 2.57  -    RPE 13  -    VO2 Peak 8.98  -    Symptoms Yes (comment)  -    Comments fatigue  -    Resting HR 74 bpm  -    Resting BP  140/77  -    Max Ex. HR 130 bpm  -    Max Ex. BP 182/80  -    2 Minute Post BP 142/72  -       Initial Exercise Prescription:     Initial Exercise Prescription - 10/28/16 1000      Date of Initial Exercise RX and Referring Provider   Date 10/28/16   Referring Provider Rinaldo Cloud MD     Treadmill   MPH 1.8   Grade 0   Minutes 10   METs 2.38     Recumbant Bike   Level 2   Minutes 10   METs 2     NuStep   Level 2   Minutes 10   METs 1.5     Prescription Details   Frequency (times per week) 3   Duration Progress to 30 minutes of continuous aerobic without signs/symptoms of physical distress     Intensity   THRR 40-80% of Max Heartrate 58-115   Ratings of Perceived Exertion 11-13   Perceived Dyspnea 0-4     Progression   Progression Continue to progress workloads to maintain  intensity without signs/symptoms of physical distress.     Resistance Training   Training Prescription Yes   Weight 2lbs   Reps 10-12      Perform Capillary Blood Glucose checks as needed.  Exercise Prescription Changes:   Exercise Comments:   Discharge Exercise Prescription (Final Exercise Prescription Changes):   Nutrition:  Target Goals: Understanding of nutrition guidelines, daily intake of sodium 1500mg , cholesterol 200mg , calories 30% from fat and 7% or less from saturated fats, daily to have 5 or more servings of fruits and vegetables.  Biometrics:     Pre Biometrics - 10/28/16 1009      Pre Biometrics   Waist Circumference 49.25 inches   Hip Circumference 43.5 inches   Waist to Hip Ratio 1.13 %   Triceps Skinfold 16 mm   % Body Fat 36.4 %   Grip Strength 39 kg   Flexibility 0 in   Single Leg Stand 6.84 seconds       Nutrition Therapy Plan and Nutrition Goals:     Nutrition Therapy & Goals - 11/03/16 1101      Nutrition Therapy   Diet Therapeutic Lifestyle Changes      Personal Nutrition Goals   Personal Goal #1 1-2 lb wt loss/week to a wt loss goal of 6-24 lb at graduation from Cardiac Rehab     Intervention Plan   Intervention Prescribe, educate and counsel regarding individualized specific dietary modifications aiming towards targeted core components such as weight, hypertension, lipid management, diabetes, heart failure and other comorbidities.   Expected Outcomes Short Term Goal: Understand basic principles of dietary content, such as calories, fat, sodium, cholesterol and nutrients.;Long Term Goal: Adherence to prescribed nutrition plan.      Nutrition Discharge: Nutrition Scores:     Nutrition Assessments - 11/03/16 1101      MEDFICTS Scores   Pre Score 15      Nutrition Goals Re-Evaluation:   Psychosocial: Target Goals: Acknowledge presence or absence of depression, maximize coping skills, provide positive support system.  Participant is able to verbalize types and ability to use techniques and skills needed for reducing stress and depression.  Initial Review & Psychosocial Screening:     Initial Psych Review & Screening - 10/28/16 1255      Family Dynamics   Good Support System? Yes   Comments  Initial assessment reveals no indentifiable barriers or further interventions needed at this time.     Barriers   Psychosocial barriers to participate in program There are no identifiable barriers or psychosocial needs.     Screening Interventions   Interventions Encouraged to exercise      Quality of Life Scores:     Quality of Life - 10/28/16 1016      Quality of Life Scores   Health/Function Pre 25.04 %   Socioeconomic Pre 24.88 %   Psych/Spiritual Pre 29.14 %   Family Pre 24.75 %   GLOBAL Pre 26 %      PHQ-9: Recent Review Flowsheet Data    Depression screen PHQ 2/9 11/03/2016   Decreased Interest 0   Down, Depressed, Hopeless 0   PHQ - 2 Score 0      Psychosocial Evaluation and Intervention:   Psychosocial Re-Evaluation:   Vocational Rehabilitation: Provide vocational rehab assistance to qualifying candidates.   Vocational Rehab Evaluation & Intervention:     Vocational Rehab - 10/28/16 1254      Initial Vocational Rehab Evaluation & Intervention   Assessment shows need for Vocational Rehabilitation No      Education: Education Goals: Education classes will be provided on a weekly basis, covering required topics. Participant will state understanding/return demonstration of topics presented.  Learning Barriers/Preferences:     Learning Barriers/Preferences - 10/28/16 0919      Learning Barriers/Preferences   Learning Barriers Sight   Learning Preferences Video;Pictoral;Computer/Internet      Education Topics: Count Your Pulse:  -Group instruction provided by verbal instruction, demonstration, patient participation and written materials to support subject.   Instructors address importance of being able to find your pulse and how to count your pulse when at home without a heart monitor.  Patients get hands on experience counting their pulse with staff help and individually.   Heart Attack, Angina, and Risk Factor Modification:  -Group instruction provided by verbal instruction, video, and written materials to support subject.  Instructors address signs and symptoms of angina and heart attacks.    Also discuss risk factors for heart disease and how to make changes to improve heart health risk factors.   Functional Fitness:  -Group instruction provided by verbal instruction, demonstration, patient participation, and written materials to support subject.  Instructors address safety measures for doing things around the house.  Discuss how to get up and down off the floor, how to pick things up properly, how to safely get out of a chair without assistance, and balance training.   Meditation and Mindfulness:  -Group instruction provided by verbal instruction, patient participation, and written materials to support subject.  Instructor addresses importance of mindfulness and meditation practice to help reduce stress and improve awareness.  Instructor also leads participants through a meditation exercise.    Stretching for Flexibility and Mobility:  -Group instruction provided by verbal instruction, patient participation, and written materials to support subject.  Instructors lead participants through series of stretches that are designed to increase flexibility thus improving mobility.  These stretches are additional exercise for major muscle groups that are typically performed during regular warm up and cool down.   Hands Only CPR Anytime:  -Group instruction provided by verbal instruction, video, patient participation and written materials to support subject.  Instructors co-teach with AHA video for hands only CPR.  Participants get hands on experience  with mannequins.   Nutrition I class: Heart Healthy Eating:  -Group instruction provided by PowerPoint slides,  verbal discussion, and written materials to support subject matter. The instructor gives an explanation and review of the Therapeutic Lifestyle Changes diet recommendations, which includes a discussion on lipid goals, dietary fat, sodium, fiber, plant stanol/sterol esters, sugar, and the components of a well-balanced, healthy diet.   Nutrition II class: Lifestyle Skills:  -Group instruction provided by PowerPoint slides, verbal discussion, and written materials to support subject matter. The instructor gives an explanation and review of label reading, grocery shopping for heart health, heart healthy recipe modifications, and ways to make healthier choices when eating out.   Diabetes Question & Answer:  -Group instruction provided by PowerPoint slides, verbal discussion, and written materials to support subject matter. The instructor gives an explanation and review of diabetes co-morbidities, pre- and post-prandial blood glucose goals, pre-exercise blood glucose goals, signs, symptoms, and treatment of hypoglycemia and hyperglycemia, and foot care basics.   Diabetes Blitz:  -Group instruction provided by PowerPoint slides, verbal discussion, and written materials to support subject matter. The instructor gives an explanation and review of the physiology behind type 1 and type 2 diabetes, diabetes medications and rational behind using different medications, pre- and post-prandial blood glucose recommendations and Hemoglobin A1c goals, diabetes diet, and exercise including blood glucose guidelines for exercising safely.    Portion Distortion:  -Group instruction provided by PowerPoint slides, verbal discussion, written materials, and food models to support subject matter. The instructor gives an explanation of serving size versus portion size, changes in portions sizes over the last 20  years, and what consists of a serving from each food group.   Stress Management:  -Group instruction provided by verbal instruction, video, and written materials to support subject matter.  Instructors review role of stress in heart disease and how to cope with stress positively.     Exercising on Your Own:  -Group instruction provided by verbal instruction, power point, and written materials to support subject.  Instructors discuss benefits of exercise, components of exercise, frequency and intensity of exercise, and end points for exercise.  Also discuss use of nitroglycerin and activating EMS.  Review options of places to exercise outside of rehab.  Review guidelines for sex with heart disease. Flowsheet Row CARDIAC REHAB PHASE II EXERCISE from 11/05/2016 in [facility] CARDIAC REHAB  Date  11/05/16  Instruction Review Code  2- meets goals/outcomes      Cardiac Drugs I:  -Group instruction provided by verbal instruction and written materials to support subject.  Instructor reviews cardiac drug classes: antiplatelets, anticoagulants, beta blockers, and statins.  Instructor discusses reasons, side effects, and lifestyle considerations for each drug class.   Cardiac Drugs II:  -Group instruction provided by verbal instruction and written materials to support subject.  Instructor reviews cardiac drug classes: angiotensin converting enzyme inhibitors (ACE-I), angiotensin II receptor blockers (ARBs), nitrates, and calcium channel blockers.  Instructor discusses reasons, side effects, and lifestyle considerations for each drug class.   Anatomy and Physiology of the Circulatory System:  -Group instruction provided by verbal instruction, video, and written materials to support subject.  Reviews functional anatomy of heart, how it relates to various diagnoses, and what role the heart plays in the overall system.   Knowledge Questionnaire Score:     Knowledge Questionnaire Score  - 10/28/16 1011      Knowledge Questionnaire Score   Pre Score 15/24      Core Components/Risk Factors/Patient Goals at Admission:     Personal Goals and Risk Factors at Admission - 10/28/16 1021  Core Components/Risk Factors/Patient Goals on Admission    Weight Management Yes;Obesity;Weight Loss   Intervention Weight Management: Develop a combined nutrition and exercise program designed to reach desired caloric intake, while maintaining appropriate intake of nutrient and fiber, sodium and fats, and appropriate energy expenditure required for the weight goal.;Weight Management: Provide education and appropriate resources to help participant work on and attain dietary goals.;Weight Management/Obesity: Establish reasonable short term and long term weight goals.;Obesity: Provide education and appropriate resources to help participant work on and attain dietary goals.   Expected Outcomes Short Term: Continue to assess and modify interventions until short term weight is achieved;Weight Maintenance: Understanding of the daily nutrition guidelines, which includes 25-35% calories from fat, 7% or less cal from saturated fats, less than 200mg  cholesterol, less than 1.5gm of sodium, & 5 or more servings of fruits and vegetables daily;Weight Loss: Understanding of general recommendations for a balanced deficit meal plan, which promotes 1-2 lb weight loss per week and includes a negative energy balance of [phone] kcal/d;Understanding recommendations for meals to include 15-35% energy as protein, 25-35% energy from fat, 35-60% energy from carbohydrates, less than 200mg  of dietary cholesterol, 20-35 gm of total fiber daily;Understanding of distribution of calorie intake throughout the day with the consumption of 4-5 meals/snacks   Increase Strength and Stamina Yes   Intervention Provide advice, education, support and counseling about physical activity/exercise needs.;Develop an individualized exercise  prescription for aerobic and resistive training based on initial evaluation findings, risk stratification, comorbidities and participant's personal goals.   Expected Outcomes Achievement of increased cardiorespiratory fitness and enhanced flexibility, muscular endurance and strength shown through measurements of functional capacity and personal statement of participant.   Hypertension Yes   Intervention Provide education on lifestyle modifcations including regular physical activity/exercise, weight management, moderate sodium restriction and increased consumption of fresh fruit, vegetables, and low fat dairy, alcohol moderation, and smoking cessation.;Monitor prescription use compliance.   Expected Outcomes Long Term: Maintenance of blood pressure at goal levels.;Short Term: Continued assessment and intervention until BP is < 140/71mm HG in hypertensive participants. < 130/69mm HG in hypertensive participants with diabetes, heart failure or chronic kidney disease.   Lipids Yes   Intervention Provide education and support for participant on nutrition & aerobic/resistive exercise along with prescribed medications to achieve LDL 70mg , HDL >40mg .   Expected Outcomes Short Term: Participant states understanding of desired cholesterol values and is compliant with medications prescribed. Participant is following exercise prescription and nutrition guidelines.;Long Term: Cholesterol controlled with medications as prescribed, with individualized exercise RX and with personalized nutrition plan. Value goals: LDL < 70mg , HDL > 40 mg.   Personal Goal Other Yes   Personal Goal Improve exercise tolerance to be able to go back to [facility] and lose weight. Goal wt at 200lbs   Intervention Provide nutrition counseling and exercise programming to assist with losing weight. Also provide exercise program and regimen to prepare for building tolerance to return to [facility].   Expected Outcomes Pt will be able to return to [facility] and  lose weight.      Core Components/Risk Factors/Patient Goals Review:    Core Components/Risk Factors/Patient Goals at Discharge (Final Review):    ITP Comments:     ITP Comments    Row Name 10/28/16 0917           ITP Comments Dr. Armanda Magic, Medical Director          Comments: Cartel has completed 3 sessions will continue to monitor BP> Recommend continued exercise  and life style modification education including  stress management and relaxation techniques to decrease cardiac risk profile.Gladstone Lighter, RN,BSN 11/05/2016 5:20 PM

## 2016-11-07 ENCOUNTER — Encounter (HOSPITAL_COMMUNITY): Payer: Medicare HMO

## 2016-11-10 ENCOUNTER — Encounter (HOSPITAL_COMMUNITY)
Admission: RE | Admit: 2016-11-10 | Discharge: 2016-11-10 | Disposition: A | Discharge status: Home / Self Care | Payer: Medicare HMO | Source: Ambulatory Visit | Attending: Cardiology | Admitting: Cardiology

## 2016-11-10 DIAGNOSIS — Z955 Presence of coronary angioplasty implant and graft: Secondary | ICD-10-CM | POA: Insufficient documentation

## 2016-11-10 NOTE — Progress Notes (Signed)
Bryan Carter returned to exercise today at cardiac rehab. Entry blood pressure 144/84 with a heart rate of 83. Maximum blood pressure noted at 164/80 on the track with a recheck blood pressure 152/80. Blood pressure noted at 160/60 on the recumbent bike and 152/80 on the nustep. Exit blood pressure  138/78 with an exit heart rate of 61. Dr Sharyn Lull called and notified.Will fax exercise flow sheets to Dr. Annitta Jersey office for review. Dr Sharyn Lull said he will contact Bryan Carter and adjust his medication. Bryan Lighter, RN,BSN 11/10/2016 2:04 PM

## 2016-11-12 ENCOUNTER — Encounter (HOSPITAL_COMMUNITY)
Admission: RE | Admit: 2016-11-12 | Discharge: 2016-11-12 | Disposition: A | Discharge status: Home / Self Care | Payer: Medicare HMO | Source: Ambulatory Visit | Attending: Cardiology | Admitting: Cardiology

## 2016-11-12 DIAGNOSIS — Z955 Presence of coronary angioplasty implant and graft: Secondary | ICD-10-CM

## 2016-11-12 NOTE — Progress Notes (Signed)
Reviewed home exercise program with pt.  Discussed mode/frequency of exercise, THRR, RPE scale and weather conditions for exercising outdoors.  Also discussed NTG use (and the importance of carrying it at all times), signs and symptoms and when to call Dr./911.  Pt verbalized understanding.  Rosine Door, MS 11/12/2016 11:12

## 2016-11-12 NOTE — Progress Notes (Signed)
Dr Sharyn Lull started Mr Schlough on 25 mg of Spironolactone once a day..Will continue to monitor the patient throughout  the program.

## 2016-11-14 ENCOUNTER — Encounter (HOSPITAL_COMMUNITY)
Admission: RE | Admit: 2016-11-14 | Discharge: 2016-11-14 | Disposition: A | Discharge status: Home / Self Care | Payer: Medicare HMO | Source: Ambulatory Visit | Attending: Cardiology | Admitting: Cardiology

## 2016-11-14 DIAGNOSIS — Z955 Presence of coronary angioplasty implant and graft: Secondary | ICD-10-CM

## 2016-11-17 ENCOUNTER — Encounter (HOSPITAL_COMMUNITY)
Admission: RE | Admit: 2016-11-17 | Discharge: 2016-11-17 | Disposition: A | Discharge status: Home / Self Care | Payer: Medicare HMO | Source: Ambulatory Visit | Attending: Cardiology | Admitting: Cardiology

## 2016-11-17 DIAGNOSIS — Z955 Presence of coronary angioplasty implant and graft: Secondary | ICD-10-CM

## 2016-11-19 ENCOUNTER — Encounter (HOSPITAL_COMMUNITY)
Admission: RE | Admit: 2016-11-19 | Discharge: 2016-11-19 | Disposition: A | Discharge status: Home / Self Care | Payer: Medicare HMO | Source: Ambulatory Visit | Attending: Cardiology | Admitting: Cardiology

## 2016-11-19 DIAGNOSIS — Z955 Presence of coronary angioplasty implant and graft: Secondary | ICD-10-CM | POA: Diagnosis not present

## 2016-11-19 NOTE — Progress Notes (Signed)
Azel's blood pressure was elevated today on the nustep at 170/84 on the airdyne with a recheck blood pressure of 148/80. Repeat blood pressure of 152/80 on the recumbent bike then 160/80 on the nustep. Dr Sharyn Lull was called and notfied. Dr Sharyn Lull increased Mr Headrick's coreg to 12.5 mg twice a day. Dr Sharyn Lull talked with Mr Hubler over the phone. Exit blood pressure 150/80 with a heart rate of 74. Will continue to monitor the patient throughout  the program.Brogen Duell Harlon Flor, RN,BSN 11/19/2016 2:15 PM

## 2016-11-21 ENCOUNTER — Encounter (HOSPITAL_COMMUNITY)
Admission: RE | Admit: 2016-11-21 | Discharge: 2016-11-21 | Disposition: A | Discharge status: Home / Self Care | Payer: Medicare HMO | Source: Ambulatory Visit | Attending: Cardiology | Admitting: Cardiology

## 2016-11-21 DIAGNOSIS — Z955 Presence of coronary angioplasty implant and graft: Secondary | ICD-10-CM

## 2016-11-24 ENCOUNTER — Encounter (HOSPITAL_COMMUNITY)
Admission: RE | Admit: 2016-11-24 | Discharge: 2016-11-24 | Disposition: A | Discharge status: Home / Self Care | Payer: Medicare HMO | Source: Ambulatory Visit | Attending: Cardiology | Admitting: Cardiology

## 2016-11-24 DIAGNOSIS — Z955 Presence of coronary angioplasty implant and graft: Secondary | ICD-10-CM | POA: Diagnosis not present

## 2016-11-24 NOTE — Progress Notes (Signed)
Carlynn Spry 76 y.o. male Nutrition Note Spoke with pt.  Nutrition Survey reviewed with pt. Pt is following Step 1 of the Therapeutic Lifestyle Changes diet. Pt states he wants to lose. Pt reports he is trying to lose wt by going to the [facility] on Tues/Thurs. Pt expressed understanding of the information reviewed. Pt aware of nutrition education classes offered and is not interested in attending nutrition classes at this time. No results found for: HGBA1C Wt Readings from Last 3 Encounters:  10/28/16 231 lb 7.7 oz (105 kg)  10/17/16 226 lb 13.7 oz (102.9 kg)  08/18/16 230 lb 8 oz (104.6 kg)  Nutrition Diagnosis ? Food-and nutrition-related knowledge deficit related to lack of exposure to information as related to diagnosis of: ? CVD ? Obesity related to excessive energy intake as evidenced by a BMI of 37.4 Nutrition Intervention ? Benefits of adopting Therapeutic Lifestyle Changes discussed when Medficts reviewed. ? Pt to attend the Portion Distortion class ? Continue client-centered nutrition education by RD, as part of interdisciplinary care.  Goal(s) ? Pt to identify and limit food sources of saturated fat, trans fat, and sodium ? Pt to identify food quantities necessary to achieve weight loss of 6-24 lb (2.7-10.9 kg) at graduation from cardiac rehab.   Monitor and Evaluate progress toward nutrition goal with team.  Mickle Plumb, M.Ed, RD, LDN, CDE 11/24/2016 11:30 AM

## 2016-11-26 ENCOUNTER — Encounter (HOSPITAL_COMMUNITY)
Admission: RE | Admit: 2016-11-26 | Discharge: 2016-11-26 | Disposition: A | Discharge status: Home / Self Care | Payer: Medicare HMO | Source: Ambulatory Visit | Attending: Cardiology | Admitting: Cardiology

## 2016-11-26 DIAGNOSIS — Z955 Presence of coronary angioplasty implant and graft: Secondary | ICD-10-CM | POA: Diagnosis not present

## 2016-11-28 ENCOUNTER — Encounter (HOSPITAL_COMMUNITY)
Admission: RE | Admit: 2016-11-28 | Discharge: 2016-11-28 | Disposition: A | Discharge status: Home / Self Care | Payer: Medicare HMO | Source: Ambulatory Visit | Attending: Cardiology | Admitting: Cardiology

## 2016-11-28 DIAGNOSIS — Z955 Presence of coronary angioplasty implant and graft: Secondary | ICD-10-CM

## 2016-12-01 ENCOUNTER — Encounter (HOSPITAL_COMMUNITY)
Admission: RE | Admit: 2016-12-01 | Discharge: 2016-12-01 | Disposition: A | Discharge status: Home / Self Care | Payer: Medicare HMO | Source: Ambulatory Visit | Attending: Cardiology | Admitting: Cardiology

## 2016-12-01 DIAGNOSIS — Z955 Presence of coronary angioplasty implant and graft: Secondary | ICD-10-CM | POA: Diagnosis not present

## 2016-12-02 NOTE — Progress Notes (Signed)
Cardiac Individual Treatment Plan  Patient Details  Name: Bryan Carter MRN: [id number] Date of Birth: 1941/02/15 Referring Provider:   Flowsheet Row CARDIAC REHAB PHASE II ORIENTATION from 10/28/2016 in [facility] CARDIAC REHAB  Referring Provider  Rinaldo Cloud MD      Initial Encounter Date:  Flowsheet Row CARDIAC REHAB PHASE II ORIENTATION from 10/28/2016 in [facility] CARDIAC REHAB  Date  10/28/16  Referring Provider  Rinaldo Cloud MD      Visit Diagnosis: 10/16/16 Stented coronary artery  Patient's Home Medications on Admission:  Current Outpatient Prescriptions:  .  amLODipine (NORVASC) 5 MG tablet, Take 5 mg by mouth daily., Disp: , Rfl:  .  aspirin 81 MG tablet, Take 81 mg by mouth daily., Disp: , Rfl:  .  atorvastatin (LIPITOR) 80 MG tablet, Take 1 tablet (80 mg total) by mouth at bedtime., Disp: 30 tablet, Rfl: 3 .  carvedilol (COREG) 12.5 MG tablet, Take 12.5 mg by mouth 2 (two) times daily with a meal., Disp: , Rfl:  .  levothyroxine (SYNTHROID, LEVOTHROID) 75 MCG tablet, Take 75 mcg by mouth daily before breakfast., Disp: , Rfl:  .  losartan (COZAAR) 100 MG tablet, Take 100 mg by mouth daily., Disp: , Rfl:  .  spironolactone (ALDACTONE) 25 MG tablet, Take 25 mg by mouth daily., Disp: , Rfl:  .  ticagrelor (BRILINTA) 90 MG TABS tablet, Take 1 tablet (90 mg total) by mouth 2 (two) times daily., Disp: 60 tablet, Rfl: 3  Past Medical History: Past Medical History:  Diagnosis Date  . AAA (abdominal aortic aneurysm) (HCC)   . Coronary artery disease   . Hyperlipidemia   . Hypertension   . Hypothyroidism   . Myocardial infarction     Tobacco Use: History  Smoking Status  . Former Smoker  . Packs/day: 1.00  . Years: 50.00  . Types: Cigarettes  . Quit date: 10/06/2006  Smokeless Tobacco  . Never Used    Labs: Recent Review Flowsheet Data    There is no flowsheet data to display.      Capillary Blood Glucose: No  results found for: GLUCAP   Exercise Target Goals:    Exercise Program Goal: Individual exercise prescription set with THRR, safety & activity barriers. Participant demonstrates ability to understand and report RPE using BORG scale, to self-measure pulse accurately, and to acknowledge the importance of the exercise prescription.  Exercise Prescription Goal: Starting with aerobic activity 30 plus minutes a day, 3 days per week for initial exercise prescription. Provide home exercise prescription and guidelines that participant acknowledges understanding prior to discharge.  Activity Barriers & Risk Stratification:     Activity Barriers & Cardiac Risk Stratification - 10/28/16 1028      Activity Barriers & Cardiac Risk Stratification   Cardiac Risk Stratification High      6 Minute Walk:     6 Minute Walk    Row Name 10/28/16 1002 10/28/16 1203       6 Minute Walk   Phase Initial  -    Distance 1175 feet  -    Walk Time 5.31 minutes  -    # of Rest Breaks 0 1  29 second rest break at the end of the walk test    MPH 2.51  -    METS 2.57  -    RPE 13  -    VO2 Peak 8.98  -    Symptoms Yes (comment)  -  Comments fatigue  -    Resting HR 74 bpm  -    Resting BP 140/77  -    Max Ex. HR 130 bpm  -    Max Ex. BP 182/80  -    2 Minute Post BP 142/72  -       Oxygen Initial Assessment:   Oxygen Re-Evaluation:   Oxygen Discharge (Final Oxygen Re-Evaluation):   Initial Exercise Prescription:     Initial Exercise Prescription - 10/28/16 1000      Date of Initial Exercise RX and Referring Provider   Date 10/28/16   Referring Provider Rinaldo Cloud MD     Treadmill   MPH 1.8   Grade 0   Minutes 10   METs 2.38     Recumbant Bike   Level 2   Minutes 10   METs 2     NuStep   Level 2   Minutes 10   METs 1.5     Prescription Details   Frequency (times per week) 3   Duration Progress to 30 minutes of continuous aerobic without signs/symptoms of  physical distress     Intensity   THRR 40-80% of Max Heartrate 58-115   Ratings of Perceived Exertion 11-13   Perceived Dyspnea 0-4     Progression   Progression Continue to progress workloads to maintain intensity without signs/symptoms of physical distress.     Resistance Training   Training Prescription Yes   Weight 2lbs   Reps 10-12      Perform Capillary Blood Glucose checks as needed.  Exercise Prescription Changes:      Exercise Prescription Changes    Row Name 12/01/16 1700             Response to Exercise   Blood Pressure (Admit) 150/78       Blood Pressure (Exercise) 160/70       Blood Pressure (Exit) 142/62       Heart Rate (Admit) 77 bpm       Heart Rate (Exercise) 92 bpm       Heart Rate (Exit) 70 bpm       Rating of Perceived Exertion (Exercise) 13       Duration Progress to 45 minutes of aerobic exercise without signs/symptoms of physical distress       Intensity THRR unchanged         Progression   Progression Continue to progress workloads to maintain intensity without signs/symptoms of physical distress.       Average METs 2.6         Resistance Training   Training Prescription Yes       Weight 3       Reps 10-15         Treadmill   MPH -       Grade -       Minutes -       METs -         Recumbant Bike   Level 2       Minutes 10       METs 2.1         NuStep   Level 5       Minutes 10       METs 3.4         Arm Ergometer   Level 1       Watts 25       Minutes 10       METs  2.26         Home Exercise Plan   Plans to continue exercise at Home (comment)       Frequency Add 3 additional days to program exercise sessions.       Initial Home Exercises Provided 11/12/16          Exercise Comments:      Exercise Comments    Row Name 12/03/16 1656           Exercise Comments Reviewed METs and goals with pt.  Pt is doing great and is very consistent with his exercise prescription in CR and with his HEP           Exercise Goals and Review:      Exercise Goals    Row Name 12/03/16 1655             Exercise Goals   Increase Physical Activity Yes       Intervention Provide advice, education, support and counseling about physical activity/exercise needs.;Develop an individualized exercise prescription for aerobic and resistive training based on initial evaluation findings, risk stratification, comorbidities and participant's personal goals.       Expected Outcomes Achievement of increased cardiorespiratory fitness and enhanced flexibility, muscular endurance and strength shown through measurements of functional capacity and personal statement of participant.       Increase Strength and Stamina Yes       Intervention Provide advice, education, support and counseling about physical activity/exercise needs.;Develop an individualized exercise prescription for aerobic and resistive training based on initial evaluation findings, risk stratification, comorbidities and participant's personal goals.       Expected Outcomes Achievement of increased cardiorespiratory fitness and enhanced flexibility, muscular endurance and strength shown through measurements of functional capacity and personal statement of participant.          Exercise Goals Re-Evaluation :     Exercise Goals Re-Evaluation    Row Name 12/01/16 1706             Exercise Goal Re-Evaluation   Exercise Goals Review Increase Physical Activity;Increase Strenth and Stamina       Comments Pt goes to the [facility] on his off days from CR and on Saturdays.  He does three 30 minute stations (Nustep, bike and arm ergometer)       Expected Outcomes Continue with exercise prescription in CR and HEP in order to increase strength, stamina and overall cardiorespiratory fitness and functional capacity.           Discharge Exercise Prescription (Final Exercise Prescription Changes):     Exercise Prescription Changes - 12/01/16 1700      Response  to Exercise   Blood Pressure (Admit) 150/78   Blood Pressure (Exercise) 160/70   Blood Pressure (Exit) 142/62   Heart Rate (Admit) 77 bpm   Heart Rate (Exercise) 92 bpm   Heart Rate (Exit) 70 bpm   Rating of Perceived Exertion (Exercise) 13   Duration Progress to 45 minutes of aerobic exercise without signs/symptoms of physical distress   Intensity THRR unchanged     Progression   Progression Continue to progress workloads to maintain intensity without signs/symptoms of physical distress.   Average METs 2.6     Resistance Training   Training Prescription Yes   Weight 3   Reps 10-15     Treadmill   MPH --   Grade --   Minutes --   METs --     Recumbant Bike   Level 2  Minutes 10   METs 2.1     NuStep   Level 5   Minutes 10   METs 3.4     Arm Ergometer   Level 1   Watts 25   Minutes 10   METs 2.26     Home Exercise Plan   Plans to continue exercise at Home (comment)   Frequency Add 3 additional days to program exercise sessions.   Initial Home Exercises Provided 11/12/16      Nutrition:  Target Goals: Understanding of nutrition guidelines, daily intake of sodium 1500mg , cholesterol 200mg , calories 30% from fat and 7% or less from saturated fats, daily to have 5 or more servings of fruits and vegetables.  Biometrics:     Pre Biometrics - 10/28/16 1009      Pre Biometrics   Waist Circumference 49.25 inches   Hip Circumference 43.5 inches   Waist to Hip Ratio 1.13 %   Triceps Skinfold 16 mm   % Body Fat 36.4 %   Grip Strength 39 kg   Flexibility 0 in   Single Leg Stand 6.84 seconds       Nutrition Therapy Plan and Nutrition Goals:     Nutrition Therapy & Goals - 11/03/16 1101      Nutrition Therapy   Diet Therapeutic Lifestyle Changes      Personal Nutrition Goals   Nutrition Goal 1-2 lb wt loss/week to a wt loss goal of 6-24 lb at graduation from Cardiac Rehab     Intervention Plan   Intervention Prescribe, educate and counsel regarding  individualized specific dietary modifications aiming towards targeted core components such as weight, hypertension, lipid management, diabetes, heart failure and other comorbidities.   Expected Outcomes Short Term Goal: Understand basic principles of dietary content, such as calories, fat, sodium, cholesterol and nutrients.;Long Term Goal: Adherence to prescribed nutrition plan.      Nutrition Discharge: Nutrition Scores:     Nutrition Assessments - 11/24/16 1129      MEDFICTS Scores   Pre Score 44      Nutrition Goals Re-Evaluation:   Nutrition Goals Re-Evaluation:   Nutrition Goals Discharge (Final Nutrition Goals Re-Evaluation):   Psychosocial: Target Goals: Acknowledge presence or absence of significant depression and/or stress, maximize coping skills, provide positive support system. Participant is able to verbalize types and ability to use techniques and skills needed for reducing stress and depression.  Initial Review & Psychosocial Screening:     Initial Psych Review & Screening - 10/28/16 1255      Family Dynamics   Good Support System? Yes   Comments Initial assessment reveals no indentifiable barriers or further interventions needed at this time.     Barriers   Psychosocial barriers to participate in program There are no identifiable barriers or psychosocial needs.     Screening Interventions   Interventions Encouraged to exercise      Quality of Life Scores:     Quality of Life - 10/28/16 1016      Quality of Life Scores   Health/Function Pre 25.04 %   Socioeconomic Pre 24.88 %   Psych/Spiritual Pre 29.14 %   Family Pre 24.75 %   GLOBAL Pre 26 %      PHQ-9: Recent Review Flowsheet Data    Depression screen [facility] 2/9 11/03/2016   Decreased Interest 0   Down, Depressed, Hopeless 0   PHQ - 2 Score 0     Interpretation of Total Score  Total Score Depression Severity:  1-4 =  Minimal depression, 5-9 = Mild depression, 10-14 = Moderate depression,  15-19 = Moderately severe depression, 20-27 = Severe depression   Psychosocial Evaluation and Intervention:   Psychosocial Re-Evaluation:     Psychosocial Re-Evaluation    Row Name 12/02/16 1309             Psychosocial Re-Evaluation   Current issues with None Identified       Interventions Encouraged to attend Cardiac Rehabilitation for the exercise       Continue Psychosocial Services  No Follow up required          Psychosocial Discharge (Final Psychosocial Re-Evaluation):     Psychosocial Re-Evaluation - 12/02/16 1309      Psychosocial Re-Evaluation   Current issues with None Identified   Interventions Encouraged to attend Cardiac Rehabilitation for the exercise   Continue Psychosocial Services  No Follow up required      Vocational Rehabilitation: Provide vocational rehab assistance to qualifying candidates.   Vocational Rehab Evaluation & Intervention:     Vocational Rehab - 10/28/16 1254      Initial Vocational Rehab Evaluation & Intervention   Assessment shows need for Vocational Rehabilitation No      Education: Education Goals: Education classes will be provided on a weekly basis, covering required topics. Participant will state understanding/return demonstration of topics presented.  Learning Barriers/Preferences:     Learning Barriers/Preferences - 10/28/16 0919      Learning Barriers/Preferences   Learning Barriers Sight   Learning Preferences Video;Pictoral;Computer/Internet      Education Topics: Count Your Pulse:  -Group instruction provided by verbal instruction, demonstration, patient participation and written materials to support subject.  Instructors address importance of being able to find your pulse and how to count your pulse when at home without a heart monitor.  Patients get hands on experience counting their pulse with staff help and individually.   Heart Attack, Angina, and Risk Factor Modification:  -Group instruction  provided by verbal instruction, video, and written materials to support subject.  Instructors address signs and symptoms of angina and heart attacks.    Also discuss risk factors for heart disease and how to make changes to improve heart health risk factors.   Functional Fitness:  -Group instruction provided by verbal instruction, demonstration, patient participation, and written materials to support subject.  Instructors address safety measures for doing things around the house.  Discuss how to get up and down off the floor, how to pick things up properly, how to safely get out of a chair without assistance, and balance training.   Meditation and Mindfulness:  -Group instruction provided by verbal instruction, patient participation, and written materials to support subject.  Instructor addresses importance of mindfulness and meditation practice to help reduce stress and improve awareness.  Instructor also leads participants through a meditation exercise.  Flowsheet Row CARDIAC REHAB PHASE II EXERCISE from 11/26/2016 in [facility] CARDIAC REHAB  Date  (P) 11/26/16  Instruction Review Code  (P) 2- meets goals/outcomes      Stretching for Flexibility and Mobility:  -Group instruction provided by verbal instruction, patient participation, and written materials to support subject.  Instructors lead participants through series of stretches that are designed to increase flexibility thus improving mobility.  These stretches are additional exercise for major muscle groups that are typically performed during regular warm up and cool down.   Hands Only CPR Anytime:  -Group instruction provided by verbal instruction, video, patient participation and written materials to support subject.  Instructors co-teach with AHA video for hands only CPR.  Participants get hands on experience with mannequins.   Nutrition I class: Heart Healthy Eating:  -Group instruction provided by PowerPoint  slides, verbal discussion, and written materials to support subject matter. The instructor gives an explanation and review of the Therapeutic Lifestyle Changes diet recommendations, which includes a discussion on lipid goals, dietary fat, sodium, fiber, plant stanol/sterol esters, sugar, and the components of a well-balanced, healthy diet.   Nutrition II class: Lifestyle Skills:  -Group instruction provided by PowerPoint slides, verbal discussion, and written materials to support subject matter. The instructor gives an explanation and review of label reading, grocery shopping for heart health, heart healthy recipe modifications, and ways to make healthier choices when eating out.   Diabetes Question & Answer:  -Group instruction provided by PowerPoint slides, verbal discussion, and written materials to support subject matter. The instructor gives an explanation and review of diabetes co-morbidities, pre- and post-prandial blood glucose goals, pre-exercise blood glucose goals, signs, symptoms, and treatment of hypoglycemia and hyperglycemia, and foot care basics.   Diabetes Blitz:  -Group instruction provided by PowerPoint slides, verbal discussion, and written materials to support subject matter. The instructor gives an explanation and review of the physiology behind type 1 and type 2 diabetes, diabetes medications and rational behind using different medications, pre- and post-prandial blood glucose recommendations and Hemoglobin A1c goals, diabetes diet, and exercise including blood glucose guidelines for exercising safely.    Portion Distortion:  -Group instruction provided by PowerPoint slides, verbal discussion, written materials, and food models to support subject matter. The instructor gives an explanation of serving size versus portion size, changes in portions sizes over the last 20 years, and what consists of a serving from each food group.   Stress Management:  -Group instruction  provided by verbal instruction, video, and written materials to support subject matter.  Instructors review role of stress in heart disease and how to cope with stress positively.     Exercising on Your Own:  -Group instruction provided by verbal instruction, power point, and written materials to support subject.  Instructors discuss benefits of exercise, components of exercise, frequency and intensity of exercise, and end points for exercise.  Also discuss use of nitroglycerin and activating EMS.  Review options of places to exercise outside of rehab.  Review guidelines for sex with heart disease. Flowsheet Row CARDIAC REHAB PHASE II EXERCISE from 11/26/2016 in [facility] CARDIAC REHAB  Date  (P) 11/05/16  Instruction Review Code  (P) 2- meets goals/outcomes      Cardiac Drugs I:  -Group instruction provided by verbal instruction and written materials to support subject.  Instructor reviews cardiac drug classes: antiplatelets, anticoagulants, beta blockers, and statins.  Instructor discusses reasons, side effects, and lifestyle considerations for each drug class.   Cardiac Drugs II:  -Group instruction provided by verbal instruction and written materials to support subject.  Instructor reviews cardiac drug classes: angiotensin converting enzyme inhibitors (ACE-I), angiotensin II receptor blockers (ARBs), nitrates, and calcium channel blockers.  Instructor discusses reasons, side effects, and lifestyle considerations for each drug class.   Anatomy and Physiology of the Circulatory System:  -Group instruction provided by verbal instruction, video, and written materials to support subject.  Reviews functional anatomy of heart, how it relates to various diagnoses, and what role the heart plays in the overall system. Flowsheet Row CARDIAC REHAB PHASE II EXERCISE from 11/26/2016 in [facility] CARDIAC REHAB  Date  (  P) 11/12/16  Instruction Review Code  (P) 2- meets  goals/outcomes      Knowledge Questionnaire Score:     Knowledge Questionnaire Score - 10/28/16 1011      Knowledge Questionnaire Score   Pre Score 15/24      Core Components/Risk Factors/Patient Goals at Admission:     Personal Goals and Risk Factors at Admission - 10/28/16 1021      Core Components/Risk Factors/Patient Goals on Admission    Weight Management Yes;Obesity;Weight Loss   Intervention Weight Management: Develop a combined nutrition and exercise program designed to reach desired caloric intake, while maintaining appropriate intake of nutrient and fiber, sodium and fats, and appropriate energy expenditure required for the weight goal.;Weight Management: Provide education and appropriate resources to help participant work on and attain dietary goals.;Weight Management/Obesity: Establish reasonable short term and long term weight goals.;Obesity: Provide education and appropriate resources to help participant work on and attain dietary goals.   Expected Outcomes Short Term: Continue to assess and modify interventions until short term weight is achieved;Weight Maintenance: Understanding of the daily nutrition guidelines, which includes 25-35% calories from fat, 7% or less cal from saturated fats, less than 200mg  cholesterol, less than 1.5gm of sodium, & 5 or more servings of fruits and vegetables daily;Weight Loss: Understanding of general recommendations for a balanced deficit meal plan, which promotes 1-2 lb weight loss per week and includes a negative energy balance of [phone] kcal/d;Understanding recommendations for meals to include 15-35% energy as protein, 25-35% energy from fat, 35-60% energy from carbohydrates, less than 200mg  of dietary cholesterol, 20-35 gm of total fiber daily;Understanding of distribution of calorie intake throughout the day with the consumption of 4-5 meals/snacks   Increase Strength and Stamina Yes   Intervention Provide advice, education, support and  counseling about physical activity/exercise needs.;Develop an individualized exercise prescription for aerobic and resistive training based on initial evaluation findings, risk stratification, comorbidities and participant's personal goals.   Expected Outcomes Achievement of increased cardiorespiratory fitness and enhanced flexibility, muscular endurance and strength shown through measurements of functional capacity and personal statement of participant.   Hypertension Yes   Intervention Provide education on lifestyle modifcations including regular physical activity/exercise, weight management, moderate sodium restriction and increased consumption of fresh fruit, vegetables, and low fat dairy, alcohol moderation, and smoking cessation.;Monitor prescription use compliance.   Expected Outcomes Long Term: Maintenance of blood pressure at goal levels.;Short Term: Continued assessment and intervention until BP is < 140/70mm HG in hypertensive participants. < 130/84mm HG in hypertensive participants with diabetes, heart failure or chronic kidney disease.   Lipids Yes   Intervention Provide education and support for participant on nutrition & aerobic/resistive exercise along with prescribed medications to achieve LDL 70mg , HDL >40mg .   Expected Outcomes Short Term: Participant states understanding of desired cholesterol values and is compliant with medications prescribed. Participant is following exercise prescription and nutrition guidelines.;Long Term: Cholesterol controlled with medications as prescribed, with individualized exercise RX and with personalized nutrition plan. Value goals: LDL < 70mg , HDL > 40 mg.   Personal Goal Other Yes   Personal Goal Improve exercise tolerance to be able to go back to [facility] and lose weight. Goal wt at 200lbs   Intervention Provide nutrition counseling and exercise programming to assist with losing weight. Also provide exercise program and regimen to prepare for building  tolerance to return to [facility].   Expected Outcomes Pt will be able to return to [facility] and lose weight.      Core  Components/Risk Factors/Patient Goals Review:    Core Components/Risk Factors/Patient Goals at Discharge (Final Review):    ITP Comments:     ITP Comments    Row Name 10/28/16 0917           ITP Comments Dr. Armanda Magic, Medical Director          Comments: Jamarien is making expected progress toward personal goals after completing 13 sessions. Recommend continued exercise and life style modification education including  stress management and relaxation techniques to decrease cardiac risk profile. Oaklyn's blood pressures have been improved since Dr Sharyn Lull adjusted Mr Arrambide's blood pressure medication.  Mr Lariccia continues to have some intermittent  Systolic exertional blood pressure elevations in the 150's and 160's. Will fax exercise flow sheets to Dr. Annitta Jersey office for review. Mr Montenegro is enjoying participating in phase 2 cardiac rehab and goes to the [facility] on his non cardiac rehab days.Thayer Headings RN BSN

## 2016-12-03 ENCOUNTER — Encounter (HOSPITAL_COMMUNITY)
Admission: RE | Admit: 2016-12-03 | Discharge: 2016-12-03 | Disposition: A | Discharge status: Home / Self Care | Payer: Medicare HMO | Source: Ambulatory Visit | Attending: Cardiology | Admitting: Cardiology

## 2016-12-03 DIAGNOSIS — Z955 Presence of coronary angioplasty implant and graft: Secondary | ICD-10-CM

## 2016-12-05 ENCOUNTER — Encounter (HOSPITAL_COMMUNITY)
Admission: RE | Admit: 2016-12-05 | Discharge: 2016-12-05 | Disposition: A | Discharge status: Home / Self Care | Payer: Medicare HMO | Source: Ambulatory Visit | Attending: Cardiology | Admitting: Cardiology

## 2016-12-05 DIAGNOSIS — Z955 Presence of coronary angioplasty implant and graft: Secondary | ICD-10-CM | POA: Diagnosis present

## 2016-12-08 ENCOUNTER — Encounter (HOSPITAL_COMMUNITY)
Admission: RE | Admit: 2016-12-08 | Discharge: 2016-12-08 | Disposition: A | Discharge status: Home / Self Care | Payer: Medicare HMO | Source: Ambulatory Visit | Attending: Cardiology | Admitting: Cardiology

## 2016-12-08 DIAGNOSIS — Z955 Presence of coronary angioplasty implant and graft: Secondary | ICD-10-CM | POA: Diagnosis not present

## 2016-12-10 ENCOUNTER — Encounter (HOSPITAL_COMMUNITY)
Admission: RE | Admit: 2016-12-10 | Discharge: 2016-12-10 | Disposition: A | Discharge status: Home / Self Care | Payer: Medicare HMO | Source: Ambulatory Visit | Attending: Cardiology | Admitting: Cardiology

## 2016-12-10 DIAGNOSIS — Z955 Presence of coronary angioplasty implant and graft: Secondary | ICD-10-CM | POA: Diagnosis not present

## 2016-12-12 ENCOUNTER — Encounter (HOSPITAL_COMMUNITY)
Admission: RE | Admit: 2016-12-12 | Discharge: 2016-12-12 | Disposition: A | Discharge status: Home / Self Care | Payer: Medicare HMO | Source: Ambulatory Visit | Attending: Cardiology | Admitting: Cardiology

## 2016-12-12 DIAGNOSIS — Z955 Presence of coronary angioplasty implant and graft: Secondary | ICD-10-CM | POA: Diagnosis not present

## 2016-12-15 ENCOUNTER — Encounter (HOSPITAL_COMMUNITY)
Admission: RE | Admit: 2016-12-15 | Discharge: 2016-12-15 | Disposition: A | Discharge status: Home / Self Care | Payer: Medicare HMO | Source: Ambulatory Visit | Attending: Cardiology | Admitting: Cardiology

## 2016-12-15 DIAGNOSIS — Z955 Presence of coronary angioplasty implant and graft: Secondary | ICD-10-CM

## 2016-12-17 ENCOUNTER — Encounter (HOSPITAL_COMMUNITY)
Admission: RE | Admit: 2016-12-17 | Discharge: 2016-12-17 | Disposition: A | Discharge status: Home / Self Care | Payer: Medicare HMO | Source: Ambulatory Visit | Attending: Cardiology | Admitting: Cardiology

## 2016-12-17 DIAGNOSIS — Z955 Presence of coronary angioplasty implant and graft: Secondary | ICD-10-CM

## 2016-12-19 ENCOUNTER — Encounter (HOSPITAL_COMMUNITY)
Admission: RE | Admit: 2016-12-19 | Discharge: 2016-12-19 | Disposition: A | Discharge status: Home / Self Care | Payer: Medicare HMO | Source: Ambulatory Visit | Attending: Cardiology | Admitting: Cardiology

## 2016-12-19 DIAGNOSIS — Z955 Presence of coronary angioplasty implant and graft: Secondary | ICD-10-CM

## 2016-12-22 ENCOUNTER — Encounter (HOSPITAL_COMMUNITY)
Admission: RE | Admit: 2016-12-22 | Discharge: 2016-12-22 | Disposition: A | Discharge status: Home / Self Care | Payer: Medicare HMO | Source: Ambulatory Visit | Attending: Cardiology | Admitting: Cardiology

## 2016-12-22 DIAGNOSIS — Z955 Presence of coronary angioplasty implant and graft: Secondary | ICD-10-CM

## 2016-12-24 ENCOUNTER — Encounter (HOSPITAL_COMMUNITY)
Admission: RE | Admit: 2016-12-24 | Discharge: 2016-12-24 | Disposition: A | Discharge status: Home / Self Care | Payer: Medicare HMO | Source: Ambulatory Visit | Attending: Cardiology | Admitting: Cardiology

## 2016-12-24 DIAGNOSIS — Z955 Presence of coronary angioplasty implant and graft: Secondary | ICD-10-CM | POA: Diagnosis not present

## 2016-12-26 ENCOUNTER — Encounter (HOSPITAL_COMMUNITY)
Admission: RE | Admit: 2016-12-26 | Discharge: 2016-12-26 | Disposition: A | Discharge status: Home / Self Care | Payer: Medicare HMO | Source: Ambulatory Visit | Attending: Cardiology | Admitting: Cardiology

## 2016-12-26 DIAGNOSIS — Z955 Presence of coronary angioplasty implant and graft: Secondary | ICD-10-CM

## 2016-12-29 ENCOUNTER — Encounter (HOSPITAL_COMMUNITY)
Admission: RE | Admit: 2016-12-29 | Discharge: 2016-12-29 | Disposition: A | Discharge status: Home / Self Care | Payer: Medicare HMO | Source: Ambulatory Visit | Attending: Cardiology | Admitting: Cardiology

## 2016-12-29 DIAGNOSIS — Z955 Presence of coronary angioplasty implant and graft: Secondary | ICD-10-CM | POA: Diagnosis not present

## 2016-12-30 NOTE — Progress Notes (Signed)
Cardiac Individual Treatment Plan  Patient Details  Name: Bryan Carter MRN: [id number] Date of Birth: December 27, 1940 Referring Provider:     CARDIAC REHAB PHASE II ORIENTATION from 10/28/2016 in [facility] CARDIAC REHAB  Referring Provider  Rinaldo Cloud MD      Initial Encounter Date:    CARDIAC REHAB PHASE II ORIENTATION from 10/28/2016 in [facility] CARDIAC REHAB  Date  10/28/16  Referring Provider  Rinaldo Cloud MD      Visit Diagnosis: 10/16/16 Stented coronary artery  Patient's Home Medications on Admission:  Current Outpatient Prescriptions:  .  amLODipine (NORVASC) 5 MG tablet, Take 5 mg by mouth daily., Disp: , Rfl:  .  aspirin 81 MG tablet, Take 81 mg by mouth daily., Disp: , Rfl:  .  atorvastatin (LIPITOR) 80 MG tablet, Take 1 tablet (80 mg total) by mouth at bedtime., Disp: 30 tablet, Rfl: 3 .  carvedilol (COREG) 12.5 MG tablet, Take 12.5 mg by mouth 2 (two) times daily with a meal., Disp: , Rfl:  .  levothyroxine (SYNTHROID, LEVOTHROID) 75 MCG tablet, Take 75 mcg by mouth daily before breakfast., Disp: , Rfl:  .  losartan (COZAAR) 100 MG tablet, Take 100 mg by mouth daily., Disp: , Rfl:  .  spironolactone (ALDACTONE) 25 MG tablet, Take 25 mg by mouth daily., Disp: , Rfl:  .  ticagrelor (BRILINTA) 90 MG TABS tablet, Take 1 tablet (90 mg total) by mouth 2 (two) times daily., Disp: 60 tablet, Rfl: 3  Past Medical History: Past Medical History:  Diagnosis Date  . AAA (abdominal aortic aneurysm) (HCC)   . Coronary artery disease   . Hyperlipidemia   . Hypertension   . Hypothyroidism   . Myocardial infarction     Tobacco Use: History  Smoking Status  . Former Smoker  . Packs/day: 1.00  . Years: 50.00  . Types: Cigarettes  . Quit date: 10/06/2006  Smokeless Tobacco  . Never Used    Labs: Recent Review Flowsheet Data    There is no flowsheet data to display.      Capillary Blood Glucose: No results found for:  GLUCAP   Exercise Target Goals:    Exercise Program Goal: Individual exercise prescription set with THRR, safety & activity barriers. Participant demonstrates ability to understand and report RPE using BORG scale, to self-measure pulse accurately, and to acknowledge the importance of the exercise prescription.  Exercise Prescription Goal: Starting with aerobic activity 30 plus minutes a day, 3 days per week for initial exercise prescription. Provide home exercise prescription and guidelines that participant acknowledges understanding prior to discharge.  Activity Barriers & Risk Stratification:     Activity Barriers & Cardiac Risk Stratification - 10/28/16 1028      Activity Barriers & Cardiac Risk Stratification   Cardiac Risk Stratification High      6 Minute Walk:     6 Minute Walk    Row Name 10/28/16 1002 10/28/16 1203       6 Minute Walk   Phase Initial  -    Distance 1175 feet  -    Walk Time 5.31 minutes  -    # of Rest Breaks 0 1  29 second rest break at the end of the walk test    MPH 2.51  -    METS 2.57  -    RPE 13  -    VO2 Peak 8.98  -    Symptoms Yes (comment)  -  Comments fatigue  -    Resting HR 74 bpm  -    Resting BP 140/77  -    Max Ex. HR 130 bpm  -    Max Ex. BP 182/80  -    2 Minute Post BP 142/72  -       Oxygen Initial Assessment:   Oxygen Re-Evaluation:   Oxygen Discharge (Final Oxygen Re-Evaluation):   Initial Exercise Prescription:     Initial Exercise Prescription - 10/28/16 1000      Date of Initial Exercise RX and Referring Provider   Date 10/28/16   Referring Provider Rinaldo Cloud MD     Treadmill   MPH 1.8   Grade 0   Minutes 10   METs 2.38     Recumbant Bike   Level 2   Minutes 10   METs 2     NuStep   Level 2   Minutes 10   METs 1.5     Prescription Details   Frequency (times per week) 3   Duration Progress to 30 minutes of continuous aerobic without signs/symptoms of physical distress      Intensity   THRR 40-80% of Max Heartrate 58-115   Ratings of Perceived Exertion 11-13   Perceived Dyspnea 0-4     Progression   Progression Continue to progress workloads to maintain intensity without signs/symptoms of physical distress.     Resistance Training   Training Prescription Yes   Weight 2lbs   Reps 10-12      Perform Capillary Blood Glucose checks as needed.  Exercise Prescription Changes:     Exercise Prescription Changes    Row Name 12/01/16 1700 12/22/16 1600           Response to Exercise   Blood Pressure (Admit) 150/78 140/80      Blood Pressure (Exercise) 160/70 144/60      Blood Pressure (Exit) 142/62 132/82      Heart Rate (Admit) 77 bpm 82 bpm      Heart Rate (Exercise) 92 bpm 94 bpm      Heart Rate (Exit) 70 bpm 72 bpm      Rating of Perceived Exertion (Exercise) 13 13      Duration Progress to 45 minutes of aerobic exercise without signs/symptoms of physical distress Progress to 45 minutes of aerobic exercise without signs/symptoms of physical distress      Intensity THRR unchanged THRR unchanged        Progression   Progression Continue to progress workloads to maintain intensity without signs/symptoms of physical distress. Continue to progress workloads to maintain intensity without signs/symptoms of physical distress.      Average METs 2.6 2.4        Resistance Training   Training Prescription Yes Yes      Weight 3 5lb      Reps 10-15 10-15        Treadmill   MPH -  -      Grade -  -      Minutes -  -      METs -  -        Recumbant Bike   Level 2 2.5      Minutes 10 10      METs 2.1 2.1        NuStep   Level 5 5      Minutes 10 10      METs 3.4 2.8  Arm Ergometer   Level 1 1      Watts 25 25      Minutes 10 10      METs 2.26 2.4        Home Exercise Plan   Plans to continue exercise at Home (comment) Home (comment)      Frequency Add 3 additional days to program exercise sessions. Add 3 additional days to program  exercise sessions.      Initial Home Exercises Provided 11/12/16 11/12/16         Exercise Comments:     Exercise Comments    Row Name 12/03/16 1656 12/24/16 1448         Exercise Comments Reviewed METs and goals with pt.  Pt is doing great and is very consistent with his exercise prescription in CR and with his HEP Reviewed METs and goals with pt.  Pt is doing great and is very consistent with his exercise prescription in CR and with his HEP         Exercise Goals and Review:     Exercise Goals    Row Name 12/03/16 1655             Exercise Goals   Increase Physical Activity Yes       Intervention Provide advice, education, support and counseling about physical activity/exercise needs.;Develop an individualized exercise prescription for aerobic and resistive training based on initial evaluation findings, risk stratification, comorbidities and participant's personal goals.       Expected Outcomes Achievement of increased cardiorespiratory fitness and enhanced flexibility, muscular endurance and strength shown through measurements of functional capacity and personal statement of participant.       Increase Strength and Stamina Yes       Intervention Provide advice, education, support and counseling about physical activity/exercise needs.;Develop an individualized exercise prescription for aerobic and resistive training based on initial evaluation findings, risk stratification, comorbidities and participant's personal goals.       Expected Outcomes Achievement of increased cardiorespiratory fitness and enhanced flexibility, muscular endurance and strength shown through measurements of functional capacity and personal statement of participant.          Exercise Goals Re-Evaluation :     Exercise Goals Re-Evaluation    Row Name 12/01/16 1706 12/24/16 1446           Exercise Goal Re-Evaluation   Exercise Goals Review Increase Physical Activity;Increase Strenth and Stamina  Increase Physical Activity;Increase Strenth and Stamina      Comments Pt goes to the [facility] on his off days from CR and on Saturdays.  He does three 30 minute stations (Nustep, bike and arm ergometer) Pt states he is still going to the [employer] 2 days/week and he says he feels like he is getting stronger and has more energy to do things at home such as cleaning and yard work.        Expected Outcomes Continue with exercise prescription in CR and HEP in order to increase strength, stamina and overall cardiorespiratory fitness and functional capacity. Continue with exercise prescription in CR and HEP in order to increase strength, stamina and overall cardiorespiratory fitness and functional capacity.          Discharge Exercise Prescription (Final Exercise Prescription Changes):     Exercise Prescription Changes - 12/22/16 1600      Response to Exercise   Blood Pressure (Admit) 140/80   Blood Pressure (Exercise) 144/60   Blood Pressure (Exit) 132/82   Heart Rate (  Admit) 82 bpm   Heart Rate (Exercise) 94 bpm   Heart Rate (Exit) 72 bpm   Rating of Perceived Exertion (Exercise) 13   Duration Progress to 45 minutes of aerobic exercise without signs/symptoms of physical distress   Intensity THRR unchanged     Progression   Progression Continue to progress workloads to maintain intensity without signs/symptoms of physical distress.   Average METs 2.4     Resistance Training   Training Prescription Yes   Weight 5lb   Reps 10-15     Recumbant Bike   Level 2.5   Minutes 10   METs 2.1     NuStep   Level 5   Minutes 10   METs 2.8     Arm Ergometer   Level 1   Watts 25   Minutes 10   METs 2.4     Home Exercise Plan   Plans to continue exercise at Home (comment)   Frequency Add 3 additional days to program exercise sessions.   Initial Home Exercises Provided 11/12/16      Nutrition:  Target Goals: Understanding of nutrition guidelines, daily intake of sodium 1500mg , cholesterol  200mg , calories 30% from fat and 7% or less from saturated fats, daily to have 5 or more servings of fruits and vegetables.  Biometrics:     Pre Biometrics - 10/28/16 1009      Pre Biometrics   Waist Circumference 49.25 inches   Hip Circumference 43.5 inches   Waist to Hip Ratio 1.13 %   Triceps Skinfold 16 mm   % Body Fat 36.4 %   Grip Strength 39 kg   Flexibility 0 in   Single Leg Stand 6.84 seconds       Nutrition Therapy Plan and Nutrition Goals:     Nutrition Therapy & Goals - 11/03/16 1101      Nutrition Therapy   Diet Therapeutic Lifestyle Changes      Personal Nutrition Goals   Nutrition Goal 1-2 lb wt loss/week to a wt loss goal of 6-24 lb at graduation from Cardiac Rehab     Intervention Plan   Intervention Prescribe, educate and counsel regarding individualized specific dietary modifications aiming towards targeted core components such as weight, hypertension, lipid management, diabetes, heart failure and other comorbidities.   Expected Outcomes Short Term Goal: Understand basic principles of dietary content, such as calories, fat, sodium, cholesterol and nutrients.;Long Term Goal: Adherence to prescribed nutrition plan.      Nutrition Discharge: Nutrition Scores:     Nutrition Assessments - 11/24/16 1129      MEDFICTS Scores   Pre Score 44      Nutrition Goals Re-Evaluation:   Nutrition Goals Re-Evaluation:   Nutrition Goals Discharge (Final Nutrition Goals Re-Evaluation):   Psychosocial: Target Goals: Acknowledge presence or absence of significant depression and/or stress, maximize coping skills, provide positive support system. Participant is able to verbalize types and ability to use techniques and skills needed for reducing stress and depression.  Initial Review & Psychosocial Screening:     Initial Psych Review & Screening - 10/28/16 1255      Family Dynamics   Good Support System? Yes   Comments Initial assessment reveals no  indentifiable barriers or further interventions needed at this time.     Barriers   Psychosocial barriers to participate in program There are no identifiable barriers or psychosocial needs.     Screening Interventions   Interventions Encouraged to exercise      Quality of  Life Scores:     Quality of Life - 10/28/16 1016      Quality of Life Scores   Health/Function Pre 25.04 %   Socioeconomic Pre 24.88 %   Psych/Spiritual Pre 29.14 %   Family Pre 24.75 %   GLOBAL Pre 26 %      PHQ-9: Recent Review Flowsheet Data    Depression screen [facility] 2/9 11/03/2016   Decreased Interest 0   Down, Depressed, Hopeless 0   PHQ - 2 Score 0     Interpretation of Total Score  Total Score Depression Severity:  1-4 = Minimal depression, 5-9 = Mild depression, 10-14 = Moderate depression, 15-19 = Moderately severe depression, 20-27 = Severe depression   Psychosocial Evaluation and Intervention:   Psychosocial Re-Evaluation:     Psychosocial Re-Evaluation    Row Name 12/02/16 1309 12/30/16 1346           Psychosocial Re-Evaluation   Current issues with None Identified None Identified      Interventions Encouraged to attend Cardiac Rehabilitation for the exercise Stress management education;Encouraged to attend Cardiac Rehabilitation for the exercise      Continue Psychosocial Services  No Follow up required No Follow up required         Psychosocial Discharge (Final Psychosocial Re-Evaluation):     Psychosocial Re-Evaluation - 12/30/16 1346      Psychosocial Re-Evaluation   Current issues with None Identified   Interventions Stress management education;Encouraged to attend Cardiac Rehabilitation for the exercise   Continue Psychosocial Services  No Follow up required      Vocational Rehabilitation: Provide vocational rehab assistance to qualifying candidates.   Vocational Rehab Evaluation & Intervention:     Vocational Rehab - 10/28/16 1254      Initial Vocational  Rehab Evaluation & Intervention   Assessment shows need for Vocational Rehabilitation No      Education: Education Goals: Education classes will be provided on a weekly basis, covering required topics. Participant will state understanding/return demonstration of topics presented.  Learning Barriers/Preferences:     Learning Barriers/Preferences - 10/28/16 0919      Learning Barriers/Preferences   Learning Barriers Sight   Learning Preferences Video;Pictoral;Computer/Internet      Education Topics: Count Your Pulse:  -Group instruction provided by verbal instruction, demonstration, patient participation and written materials to support subject.  Instructors address importance of being able to find your pulse and how to count your pulse when at home without a heart monitor.  Patients get hands on experience counting their pulse with staff help and individually.   Heart Attack, Angina, and Risk Factor Modification:  -Group instruction provided by verbal instruction, video, and written materials to support subject.  Instructors address signs and symptoms of angina and heart attacks.    Also discuss risk factors for heart disease and how to make changes to improve heart health risk factors.   Functional Fitness:  -Group instruction provided by verbal instruction, demonstration, patient participation, and written materials to support subject.  Instructors address safety measures for doing things around the house.  Discuss how to get up and down off the floor, how to pick things up properly, how to safely get out of a chair without assistance, and balance training.   Meditation and Mindfulness:  -Group instruction provided by verbal instruction, patient participation, and written materials to support subject.  Instructor addresses importance of mindfulness and meditation practice to help reduce stress and improve awareness.  Instructor also leads participants through a meditation  exercise.     CARDIAC REHAB PHASE II EXERCISE from 12/03/2016 in [facility] CARDIAC REHAB  Date  11/26/16  Instruction Review Code  2- meets goals/outcomes      Stretching for Flexibility and Mobility:  -Group instruction provided by verbal instruction, patient participation, and written materials to support subject.  Instructors lead participants through series of stretches that are designed to increase flexibility thus improving mobility.  These stretches are additional exercise for major muscle groups that are typically performed during regular warm up and cool down.   Hands Only CPR Anytime:  -Group instruction provided by verbal instruction, video, patient participation and written materials to support subject.  Instructors co-teach with AHA video for hands only CPR.  Participants get hands on experience with mannequins.   Nutrition I class: Heart Healthy Eating:  -Group instruction provided by PowerPoint slides, verbal discussion, and written materials to support subject matter. The instructor gives an explanation and review of the Therapeutic Lifestyle Changes diet recommendations, which includes a discussion on lipid goals, dietary fat, sodium, fiber, plant stanol/sterol esters, sugar, and the components of a well-balanced, healthy diet.   Nutrition II class: Lifestyle Skills:  -Group instruction provided by PowerPoint slides, verbal discussion, and written materials to support subject matter. The instructor gives an explanation and review of label reading, grocery shopping for heart health, heart healthy recipe modifications, and ways to make healthier choices when eating out.   Diabetes Question & Answer:  -Group instruction provided by PowerPoint slides, verbal discussion, and written materials to support subject matter. The instructor gives an explanation and review of diabetes co-morbidities, pre- and post-prandial blood glucose goals, pre-exercise blood glucose goals,  signs, symptoms, and treatment of hypoglycemia and hyperglycemia, and foot care basics.   Diabetes Blitz:  -Group instruction provided by PowerPoint slides, verbal discussion, and written materials to support subject matter. The instructor gives an explanation and review of the physiology behind type 1 and type 2 diabetes, diabetes medications and rational behind using different medications, pre- and post-prandial blood glucose recommendations and Hemoglobin A1c goals, diabetes diet, and exercise including blood glucose guidelines for exercising safely.    Portion Distortion:  -Group instruction provided by PowerPoint slides, verbal discussion, written materials, and food models to support subject matter. The instructor gives an explanation of serving size versus portion size, changes in portions sizes over the last 20 years, and what consists of a serving from each food group.   CARDIAC REHAB PHASE II EXERCISE from 12/03/2016 in [facility] CARDIAC REHAB  Date  12/03/16  Educator  RD  Instruction Review Code  2- meets goals/outcomes      Stress Management:  -Group instruction provided by verbal instruction, video, and written materials to support subject matter.  Instructors review role of stress in heart disease and how to cope with stress positively.     Exercising on Your Own:  -Group instruction provided by verbal instruction, power point, and written materials to support subject.  Instructors discuss benefits of exercise, components of exercise, frequency and intensity of exercise, and end points for exercise.  Also discuss use of nitroglycerin and activating EMS.  Review options of places to exercise outside of rehab.  Review guidelines for sex with heart disease.   CARDIAC REHAB PHASE II EXERCISE from 12/03/2016 in [facility] CARDIAC REHAB  Date  11/05/16  Instruction Review Code  2- meets goals/outcomes      Cardiac Drugs I:  -Group instruction  provided  by verbal instruction and written materials to support subject.  Instructor reviews cardiac drug classes: antiplatelets, anticoagulants, beta blockers, and statins.  Instructor discusses reasons, side effects, and lifestyle considerations for each drug class.   Cardiac Drugs II:  -Group instruction provided by verbal instruction and written materials to support subject.  Instructor reviews cardiac drug classes: angiotensin converting enzyme inhibitors (ACE-I), angiotensin II receptor blockers (ARBs), nitrates, and calcium channel blockers.  Instructor discusses reasons, side effects, and lifestyle considerations for each drug class.   Anatomy and Physiology of the Circulatory System:  -Group instruction provided by verbal instruction, video, and written materials to support subject.  Reviews functional anatomy of heart, how it relates to various diagnoses, and what role the heart plays in the overall system.   CARDIAC REHAB PHASE II EXERCISE from 12/03/2016 in [facility] CARDIAC REHAB  Date  11/12/16  Instruction Review Code  2- meets goals/outcomes      Knowledge Questionnaire Score:     Knowledge Questionnaire Score - 10/28/16 1011      Knowledge Questionnaire Score   Pre Score 15/24      Core Components/Risk Factors/Patient Goals at Admission:     Personal Goals and Risk Factors at Admission - 10/28/16 1021      Core Components/Risk Factors/Patient Goals on Admission    Weight Management Yes;Obesity;Weight Loss   Intervention Weight Management: Develop a combined nutrition and exercise program designed to reach desired caloric intake, while maintaining appropriate intake of nutrient and fiber, sodium and fats, and appropriate energy expenditure required for the weight goal.;Weight Management: Provide education and appropriate resources to help participant work on and attain dietary goals.;Weight Management/Obesity: Establish reasonable short term and long  term weight goals.;Obesity: Provide education and appropriate resources to help participant work on and attain dietary goals.   Expected Outcomes Short Term: Continue to assess and modify interventions until short term weight is achieved;Weight Maintenance: Understanding of the daily nutrition guidelines, which includes 25-35% calories from fat, 7% or less cal from saturated fats, less than 200mg  cholesterol, less than 1.5gm of sodium, & 5 or more servings of fruits and vegetables daily;Weight Loss: Understanding of general recommendations for a balanced deficit meal plan, which promotes 1-2 lb weight loss per week and includes a negative energy balance of [id number] kcal/d;Understanding recommendations for meals to include 15-35% energy as protein, 25-35% energy from fat, 35-60% energy from carbohydrates, less than 200mg  of dietary cholesterol, 20-35 gm of total fiber daily;Understanding of distribution of calorie intake throughout the day with the consumption of 4-5 meals/snacks   Increase Strength and Stamina Yes   Intervention Provide advice, education, support and counseling about physical activity/exercise needs.;Develop an individualized exercise prescription for aerobic and resistive training based on initial evaluation findings, risk stratification, comorbidities and participant's personal goals.   Expected Outcomes Achievement of increased cardiorespiratory fitness and enhanced flexibility, muscular endurance and strength shown through measurements of functional capacity and personal statement of participant.   Hypertension Yes   Intervention Provide education on lifestyle modifcations including regular physical activity/exercise, weight management, moderate sodium restriction and increased consumption of fresh fruit, vegetables, and low fat dairy, alcohol moderation, and smoking cessation.;Monitor prescription use compliance.   Expected Outcomes Long Term: Maintenance of blood pressure at goal  levels.;Short Term: Continued assessment and intervention until BP is < 140/82mm HG in hypertensive participants. < 130/68mm HG in hypertensive participants with diabetes, heart failure or chronic kidney disease.   Lipids Yes   Intervention Provide education and support  for participant on nutrition & aerobic/resistive exercise along with prescribed medications to achieve LDL 70mg , HDL >40mg .   Expected Outcomes Short Term: Participant states understanding of desired cholesterol values and is compliant with medications prescribed. Participant is following exercise prescription and nutrition guidelines.;Long Term: Cholesterol controlled with medications as prescribed, with individualized exercise RX and with personalized nutrition plan. Value goals: LDL < 70mg , HDL > 40 mg.   Personal Goal Other Yes   Personal Goal Improve exercise tolerance to be able to go back to [facility] and lose weight. Goal wt at 200lbs   Intervention Provide nutrition counseling and exercise programming to assist with losing weight. Also provide exercise program and regimen to prepare for building tolerance to return to [facility].   Expected Outcomes Pt will be able to return to [facility] and lose weight.      Core Components/Risk Factors/Patient Goals Review:    Core Components/Risk Factors/Patient Goals at Discharge (Final Review):    ITP Comments:     ITP Comments    Row Name 10/28/16 0917           ITP Comments Dr. Armanda Magic, Medical Director          Comments: Armand is making expected progress toward personal goals after completing 25 sessions. Recommend continued exercise and life style modification education including  stress management and relaxation techniques to decrease cardiac risk profile. Jojo has been doing well with exercise both at cardiac rehab and at the [facility]. Shulem blood pressures are much improved.Gladstone Lighter, RN,BSN 12/30/2016 1:52 PM

## 2016-12-31 ENCOUNTER — Encounter (HOSPITAL_COMMUNITY)
Admission: RE | Admit: 2016-12-31 | Discharge: 2016-12-31 | Disposition: A | Discharge status: Home / Self Care | Payer: Medicare HMO | Source: Ambulatory Visit | Attending: Cardiology | Admitting: Cardiology

## 2016-12-31 DIAGNOSIS — Z955 Presence of coronary angioplasty implant and graft: Secondary | ICD-10-CM

## 2017-01-02 ENCOUNTER — Encounter (HOSPITAL_COMMUNITY)
Admission: RE | Admit: 2017-01-02 | Discharge: 2017-01-02 | Disposition: A | Discharge status: Home / Self Care | Payer: Medicare HMO | Source: Ambulatory Visit | Attending: Cardiology | Admitting: Cardiology

## 2017-01-02 DIAGNOSIS — Z955 Presence of coronary angioplasty implant and graft: Secondary | ICD-10-CM

## 2017-01-05 ENCOUNTER — Encounter (HOSPITAL_COMMUNITY)
Admission: RE | Admit: 2017-01-05 | Discharge: 2017-01-05 | Disposition: A | Discharge status: Home / Self Care | Payer: Medicare HMO | Source: Ambulatory Visit | Attending: Cardiology | Admitting: Cardiology

## 2017-01-05 DIAGNOSIS — Z955 Presence of coronary angioplasty implant and graft: Secondary | ICD-10-CM | POA: Insufficient documentation

## 2017-01-07 ENCOUNTER — Encounter (HOSPITAL_COMMUNITY)
Admission: RE | Admit: 2017-01-07 | Discharge: 2017-01-07 | Disposition: A | Discharge status: Home / Self Care | Payer: Medicare HMO | Source: Ambulatory Visit | Attending: Cardiology | Admitting: Cardiology

## 2017-01-07 DIAGNOSIS — Z955 Presence of coronary angioplasty implant and graft: Secondary | ICD-10-CM

## 2017-01-09 ENCOUNTER — Encounter (HOSPITAL_COMMUNITY)
Admission: RE | Admit: 2017-01-09 | Discharge: 2017-01-09 | Disposition: A | Discharge status: Home / Self Care | Payer: Medicare HMO | Source: Ambulatory Visit | Attending: Cardiology | Admitting: Cardiology

## 2017-01-09 DIAGNOSIS — Z955 Presence of coronary angioplasty implant and graft: Secondary | ICD-10-CM

## 2017-01-12 ENCOUNTER — Encounter (HOSPITAL_COMMUNITY)
Admission: RE | Admit: 2017-01-12 | Discharge: 2017-01-12 | Disposition: A | Discharge status: Home / Self Care | Payer: Medicare HMO | Source: Ambulatory Visit | Attending: Cardiology | Admitting: Cardiology

## 2017-01-12 DIAGNOSIS — Z955 Presence of coronary angioplasty implant and graft: Secondary | ICD-10-CM

## 2017-01-14 ENCOUNTER — Encounter (HOSPITAL_COMMUNITY)
Admission: RE | Admit: 2017-01-14 | Discharge: 2017-01-14 | Disposition: A | Discharge status: Home / Self Care | Payer: Medicare HMO | Source: Ambulatory Visit | Attending: Cardiology | Admitting: Cardiology

## 2017-01-14 DIAGNOSIS — Z955 Presence of coronary angioplasty implant and graft: Secondary | ICD-10-CM | POA: Diagnosis not present

## 2017-01-16 ENCOUNTER — Encounter (HOSPITAL_COMMUNITY)
Admission: RE | Admit: 2017-01-16 | Discharge: 2017-01-16 | Disposition: A | Discharge status: Home / Self Care | Payer: Medicare HMO | Source: Ambulatory Visit | Attending: Cardiology | Admitting: Cardiology

## 2017-01-16 DIAGNOSIS — Z955 Presence of coronary angioplasty implant and graft: Secondary | ICD-10-CM | POA: Diagnosis not present

## 2017-01-19 ENCOUNTER — Encounter (HOSPITAL_COMMUNITY)
Admission: RE | Admit: 2017-01-19 | Discharge: 2017-01-19 | Disposition: A | Discharge status: Home / Self Care | Payer: Medicare HMO | Source: Ambulatory Visit | Attending: Cardiology | Admitting: Cardiology

## 2017-01-19 DIAGNOSIS — Z955 Presence of coronary angioplasty implant and graft: Secondary | ICD-10-CM

## 2017-01-21 ENCOUNTER — Encounter (HOSPITAL_COMMUNITY)
Admission: RE | Admit: 2017-01-21 | Discharge: 2017-01-21 | Disposition: A | Discharge status: Home / Self Care | Payer: Medicare HMO | Source: Ambulatory Visit | Attending: Cardiology | Admitting: Cardiology

## 2017-01-21 DIAGNOSIS — Z955 Presence of coronary angioplasty implant and graft: Secondary | ICD-10-CM | POA: Diagnosis not present

## 2017-01-23 ENCOUNTER — Encounter (HOSPITAL_COMMUNITY)
Admission: RE | Admit: 2017-01-23 | Discharge: 2017-01-23 | Disposition: A | Discharge status: Home / Self Care | Payer: Medicare HMO | Source: Ambulatory Visit | Attending: Cardiology | Admitting: Cardiology

## 2017-01-23 VITALS — Ht 66.0 in | Wt 232.8 lb

## 2017-01-23 DIAGNOSIS — Z955 Presence of coronary angioplasty implant and graft: Secondary | ICD-10-CM | POA: Diagnosis not present

## 2017-01-23 NOTE — Progress Notes (Signed)
Discharge Summary  Patient Details  Name: Bryan Carter MRN: [id number] Date of Birth: 01/28/1941 Referring Provider:     Carrick from 10/28/2016 in [place]  Referring Provider  Charolette Forward MD       Number of Visits: 36  Reason for Discharge:  Patient independent in their exercise.  Smoking History:  History  Smoking Status  . Former Smoker  . Packs/day: 1.00  . Years: 50.00  . Types: Cigarettes  . Quit date: 10/06/2006  Smokeless Tobacco  . Never Used    Diagnosis:  10/16/16 Stented coronary artery  ADL UCSD:   Initial Exercise Prescription:     Initial Exercise Prescription - 10/28/16 1000      Date of Initial Exercise RX and Referring Provider   Date 10/28/16   Referring Provider Charolette Forward MD     Treadmill   MPH 1.8   Grade 0   Minutes 10   METs 2.38     Recumbant Bike   Level 2   Minutes 10   METs 2     NuStep   Level 2   Minutes 10   METs 1.5     Prescription Details   Frequency (times per week) 3   Duration Progress to 30 minutes of continuous aerobic without signs/symptoms of physical distress     Intensity   THRR 40-80% of Max Heartrate 58-115   Ratings of Perceived Exertion 11-13   Perceived Dyspnea 0-4     Progression   Progression Continue to progress workloads to maintain intensity without signs/symptoms of physical distress.     Resistance Training   Training Prescription Yes   Weight 2lbs   Reps 10-12      Discharge Exercise Prescription (Final Exercise Prescription Changes):     Exercise Prescription Changes - 02/12/17 1700      Response to Exercise   Blood Pressure (Admit) 124/84   Blood Pressure (Exercise) 140/76   Blood Pressure (Exit) 120/68   Heart Rate (Admit) 71 bpm   Heart Rate (Exercise) 86 bpm   Heart Rate (Exit) 72 bpm   Rating of Perceived Exertion (Exercise) 13   Duration Progress to 45 minutes of aerobic exercise without  signs/symptoms of physical distress   Intensity THRR unchanged     Progression   Progression Continue to progress workloads to maintain intensity without signs/symptoms of physical distress.   Average METs 2.9     Resistance Training   Training Prescription Yes   Weight 5lb   Reps 10-15     Recumbant Bike   Level 4   Minutes 10   METs 2.6     NuStep   Level 5   Minutes 10   METs 2.8     Arm Ergometer   Level 1   Watts 25   Minutes 10   METs 3.1     Home Exercise Plan   Plans to continue exercise at Home (comment)   Frequency Add 3 additional days to program exercise sessions.   Initial Home Exercises Provided 11/12/16      Functional Capacity:     6 Minute Walk    Row Name 10/28/16 1002 10/28/16 1203 02/12/17 1658     6 Minute Walk   Phase Initial  - Discharge   Distance 1175 feet  - 1000 feet   Distance % Change  -  - -14.8 %   Walk Time 5.31 minutes  - 5 minutes   #  of Rest Breaks 0 1  29 second rest break at the end of the walk test 1   MPH 2.51  - 1.9   METS 2.57  - 1.6   RPE 13  - 14   VO2 Peak 8.98  - 5.5   Symptoms Yes (comment)  - Yes (comment)   Comments fatigue  - rt knee pain   Resting HR 74 bpm  - 80 bpm   Resting BP 140/77  - 132/84   Max Ex. HR 130 bpm  - 85 bpm   Max Ex. BP 182/80  - 170/78   2 Minute Post BP 142/72  - 130/72      Psychological, QOL, Others - Outcomes: PHQ 2/9: Depression screen [facility] 2/9 01/23/2017 11/03/2016  Decreased Interest 0 0  Down, Depressed, Hopeless 0 0  PHQ - 2 Score 0 0    Quality of Life:     Quality of Life - 02/12/17 1715      Quality of Life Scores   Health/Function Pre 25.04 %   Health/Function Post 22.57 %   Health/Function % Change -9.86 %   Socioeconomic Pre 24.88 %   Socioeconomic Post 26.5 %   Socioeconomic % Change  6.51 %   Psych/Spiritual Pre 29.14 %   Psych/Spiritual Post 28.29 %   Psych/Spiritual % Change -2.92 %   Family Pre 24.75 %   Family Post 22.8 %   Family % Change  -7.88 %   GLOBAL Pre 26 %   GLOBAL Post 24.53 %   GLOBAL % Change -5.65 %      Personal Goals: Goals established at orientation with interventions provided to work toward goal.     Personal Goals and Risk Factors at Admission - 10/28/16 1021      Core Components/Risk Factors/Patient Goals on Admission    Weight Management Yes;Obesity;Weight Loss   Intervention Weight Management: Develop a combined nutrition and exercise program designed to reach desired caloric intake, while maintaining appropriate intake of nutrient and fiber, sodium and fats, and appropriate energy expenditure required for the weight goal.;Weight Management: Provide education and appropriate resources to help participant work on and attain dietary goals.;Weight Management/Obesity: Establish reasonable short term and long term weight goals.;Obesity: Provide education and appropriate resources to help participant work on and attain dietary goals.   Expected Outcomes Short Term: Continue to assess and modify interventions until short term weight is achieved;Weight Maintenance: Understanding of the daily nutrition guidelines, which includes 25-35% calories from fat, 7% or less cal from saturated fats, less than 248m cholesterol, less than 1.5gm of sodium, & 5 or more servings of fruits and vegetables daily;Weight Loss: Understanding of general recommendations for a balanced deficit meal plan, which promotes 1-2 lb weight loss per week and includes a negative energy balance of [phone] kcal/d;Understanding recommendations for meals to include 15-35% energy as protein, 25-35% energy from fat, 35-60% energy from carbohydrates, less than 2087mof dietary cholesterol, 20-35 gm of total fiber daily;Understanding of distribution of calorie intake throughout the day with the consumption of 4-5 meals/snacks   Increase Strength and Stamina Yes   Intervention Provide advice, education, support and counseling about physical activity/exercise  needs.;Develop an individualized exercise prescription for aerobic and resistive training based on initial evaluation findings, risk stratification, comorbidities and participant's personal goals.   Expected Outcomes Achievement of increased cardiorespiratory fitness and enhanced flexibility, muscular endurance and strength shown through measurements of functional capacity and personal statement of participant.   Hypertension Yes  Intervention Provide education on lifestyle modifcations including regular physical activity/exercise, weight management, moderate sodium restriction and increased consumption of fresh fruit, vegetables, and low fat dairy, alcohol moderation, and smoking cessation.;Monitor prescription use compliance.   Expected Outcomes Long Term: Maintenance of blood pressure at goal levels.;Short Term: Continued assessment and intervention until BP is < 140/48m HG in hypertensive participants. < 130/885mHG in hypertensive participants with diabetes, heart failure or chronic kidney disease.   Lipids Yes   Intervention Provide education and support for participant on nutrition & aerobic/resistive exercise along with prescribed medications to achieve LDL <7057mHDL >44m34m Expected Outcomes Short Term: Participant states understanding of desired cholesterol values and is compliant with medications prescribed. Participant is following exercise prescription and nutrition guidelines.;Long Term: Cholesterol controlled with medications as prescribed, with individualized exercise RX and with personalized nutrition plan. Value goals: LDL < 70mg57mL > 40 mg.   Personal Goal Other Yes   Personal Goal Improve exercise tolerance [facility] Goal wt at 200lbs   Intervention Provide nutrition counseling and exercise programming to assist with losing weight. Also provide exercise program and regimen to prepare for building [facility]  [facility]       Personal Goals Discharge:   Nutrition & Weight - Outcomes:     Pre Biometrics - 10/28/16 1009      Pre Biometrics   Waist Circumference 49.25 inches   Hip Circumference 43.5 inches   Waist to Hip Ratio 1.13 %   Triceps Skinfold 16 mm   % Body Fat 36.4 %   Grip Strength 39 kg   Flexibility 0 in   Single Leg Stand 6.84 seconds         Post Biometrics - 02/12/17 1714       Post  Biometrics   Height _0  (1.676 m)   Weight 232 lb 12.9 oz (105.6 kg)   Waist Circumference 44.25 inches   Hip Circumference 43.75 inches   Waist to Hip Ratio 1.01 %   BMI (Calculated) 37.7   Triceps Skinfold 22 mm   % Body Fat 35.4 %   Grip Strength 49 kg   Flexibility 0 in   Single Leg Stand 2 seconds      Nutrition:     Nutrition Therapy & Goals - 11/03/16 1101      Nutrition Therapy   Diet Therapeutic Lifestyle Changes      Personal Nutrition Goals   Nutrition Goal 1-2 lb wt loss/week to a wt loss goal of 6-24 lb [facility] and counsel regarding individualized specific dietary modifications aiming towards targeted core components such as weight, hypertension, lipid management, diabetes, heart failure and other comorbidities.   Expected Outcomes Short Term Goal: Understand basic principles of dietary content, such as calories, fat, sodium, cholesterol and nutrients.;Long Term Goal: Adherence to prescribed nutrition plan.      Nutrition Discharge:     Nutrition Assessments - 02/04/17 0935      MEDFICTS Scores   Pre Score 44   Post Score 12   Score Difference -32      Education Questionnaire Score:     Knowledge Questionnaire Score - 02/12/17 1658      Knowledge Questionnaire Score   Post Score 19/24  Goals reviewed with patient; copy given to patient.Bryan Carter graduated from cardiac rehab program today with completion  of 36 exercise sessions in Phase II. Pt maintained good attendance and progressed nicely during his participation in rehab as evidenced by increased MET level.   Medication list reconciled. Repeat  PHQ score-  0.  Pt has made significant lifestyle changes and should be commended for his success. Pt feels he has achieved his goals during cardiac rehab.   Pt plans to continue exercise at the [facility]. Bryan Carter's blood pressures have been improved. We are very proud of Bryan Carter's acheivements.Barnet Pall, RN,BSN 02/13/2017 3:01 PM

## 2017-01-26 ENCOUNTER — Encounter (HOSPITAL_COMMUNITY): Payer: Medicare HMO

## 2017-01-28 ENCOUNTER — Encounter (HOSPITAL_COMMUNITY): Payer: Medicare HMO

## 2017-01-30 ENCOUNTER — Encounter (HOSPITAL_COMMUNITY): Payer: Medicare HMO

## 2017-02-02 ENCOUNTER — Encounter (HOSPITAL_COMMUNITY): Payer: Medicare HMO

## 2017-02-04 ENCOUNTER — Encounter (HOSPITAL_COMMUNITY): Payer: Medicare HMO

## 2017-02-06 ENCOUNTER — Encounter (HOSPITAL_COMMUNITY): Payer: Medicare HMO

## 2017-02-09 ENCOUNTER — Encounter (HOSPITAL_COMMUNITY): Payer: Medicare HMO

## 2017-02-11 ENCOUNTER — Encounter (HOSPITAL_COMMUNITY): Payer: Medicare HMO

## 2017-02-13 ENCOUNTER — Encounter (HOSPITAL_COMMUNITY): Payer: Medicare HMO

## 2017-02-16 ENCOUNTER — Encounter (HOSPITAL_COMMUNITY): Payer: Medicare HMO

## 2017-02-18 ENCOUNTER — Encounter (HOSPITAL_COMMUNITY): Payer: Medicare HMO

## 2017-02-20 ENCOUNTER — Encounter (HOSPITAL_COMMUNITY): Payer: Medicare HMO

## 2017-02-23 ENCOUNTER — Encounter (HOSPITAL_COMMUNITY): Payer: Medicare HMO

## 2017-02-25 ENCOUNTER — Encounter (HOSPITAL_COMMUNITY): Payer: Medicare HMO

## 2017-02-27 ENCOUNTER — Encounter (HOSPITAL_COMMUNITY): Payer: Medicare HMO

## 2017-03-02 ENCOUNTER — Encounter (HOSPITAL_COMMUNITY): Payer: Medicare HMO

## 2017-08-24 ENCOUNTER — Ambulatory Visit (HOSPITAL_COMMUNITY)
Admission: RE | Admit: 2017-08-24 | Discharge: 2017-08-24 | Disposition: A | Discharge status: Home / Self Care | Payer: Medicare HMO | Source: Ambulatory Visit | Attending: Family | Admitting: Family

## 2017-08-24 ENCOUNTER — Ambulatory Visit (INDEPENDENT_AMBULATORY_CARE_PROVIDER_SITE_OTHER)
Admission: RE | Admit: 2017-08-24 | Discharge: 2017-08-24 | Disposition: A | Discharge status: Home / Self Care | Payer: Medicare HMO | Source: Ambulatory Visit | Attending: Family | Admitting: Family

## 2017-08-24 ENCOUNTER — Encounter: Payer: Self-pay | Admitting: Family

## 2017-08-24 ENCOUNTER — Ambulatory Visit (INDEPENDENT_AMBULATORY_CARE_PROVIDER_SITE_OTHER): Payer: Medicare HMO | Admitting: Family

## 2017-08-24 VITALS — BP 156/83 | HR 86 | Temp 97.6°F | Resp 19 | Wt 230.1 lb

## 2017-08-24 DIAGNOSIS — I739 Peripheral vascular disease, unspecified: Secondary | ICD-10-CM | POA: Diagnosis not present

## 2017-08-24 DIAGNOSIS — Z87891 Personal history of nicotine dependence: Secondary | ICD-10-CM

## 2017-08-24 DIAGNOSIS — I714 Abdominal aortic aneurysm, without rupture, unspecified: Secondary | ICD-10-CM

## 2017-08-24 DIAGNOSIS — R9439 Abnormal result of other cardiovascular function study: Secondary | ICD-10-CM | POA: Insufficient documentation

## 2017-08-24 DIAGNOSIS — R609 Edema, unspecified: Secondary | ICD-10-CM

## 2017-08-24 DIAGNOSIS — I779 Disorder of arteries and arterioles, unspecified: Secondary | ICD-10-CM | POA: Diagnosis not present

## 2017-08-24 NOTE — Progress Notes (Signed)
[facility] [place]   CC: Follow up Abdominal Aortic Aneurysm  History of Present Illness  Bryan Carter is a 76 y.o. ([birth year]) male patient of Dr. Myra Gianotti who returns for follow-up of abdominal aortic aneurysm. Previously it measured 4.1 cm by ultrasound.  The patient has a history of prostate cancer. He has a history of undergoing a colonoscopy complicated by perforation requiring colon resection. This was done through a midline incision. He also developed a hernia and has had a incisional hernia performed with mesh.  The patient has a history of coronary artery disease. He has had a heart attack and has undergone coronary stenting 2. He is medically managed for hypercholesterolemia with a statin. He quit smoking about 2008 when he had his heart attack. He takes a daily 81 mg ASA.   Pt states he had an echocardiogram recently re dyspnea and swelling in his right leg, states requested by his PCP.  Pt states his blood pressure at home is 130's/74  He rides a stationary bike for an hour 3-4 days/week for an hour an does upper body exercises also. He has a history of a right ankle injury in 2016. He reports right hip pain after working out about an hour.  He denies back or abdominal pain.   The patient denies history of stroke or TIA symptoms.  Pt Diabetic: No Pt smoker: former smoker, quit in 2008    Past Medical History:  Diagnosis Date  . AAA (abdominal aortic aneurysm) (HCC)   . Coronary artery disease   . Hyperlipidemia   . Hypertension   . Hypothyroidism   . Myocardial infarction [facility])    Past Surgical History:  Procedure Laterality Date  . ABDOMINAL EXPLORATION SURGERY  ~ 2006   "had to open me up and put mesh in; 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"  . CATARACT EXTRACTION W/ INTRAOCULAR LENS  IMPLANT, BILATERAL    . COLON SURGERY  2006   S/P colonoscopy punctures  . COLONOSCOPY  2006   "punctured my colon; ended up in hospital"  .  CORONARY ANGIOPLASTY    . Coronary Stent Intervention N/A 10/16/2016   Performed by Rinaldo Cloud, MD at [facility] INVASIVE CV LAB  . HERNIA REPAIR    . Left Heart Cath and Coronary Angiography N/A 10/16/2016   Performed by Rinaldo Cloud, MD at [facility] INVASIVE CV LAB   Social History Social History   Socioeconomic History  . Marital status: Married    Spouse name: Not on file  . Number of children: Not on file  . Years of education: Not on file  . Highest education level: Not on file  Social Needs  . Financial resource strain: Not on file  . Food insecurity - worry: Not on file  . Food insecurity - inability: Not on file  . Transportation needs - medical: Not on file  . Transportation needs - non-medical: Not on file  Occupational History  . Not on file  Tobacco Use  . Smoking status: Former Smoker    Packs/day: 1.00    Years: 50.00    Pack years: 50.00    Types: Cigarettes    Last attempt to quit: 10/06/2006    Years since quitting: 10.8  . Smokeless tobacco: Never Used  Substance and Sexual Activity  . Alcohol use: Yes    Alcohol/week: 0.0 oz    Comment: 10/16/2016 "might drink a beer once/year"  . Drug use: Yes    Types: GHB  Comment: 10/16/2016 pt denies hx of drug use  . Sexual activity: Yes  Other Topics Concern  . Not on file  Social History Narrative  . Not on file   Family History Family History  Problem Relation Age of Onset  . Heart attack Mother   . Heart disease Mother   . Heart attack Father   . Heart disease Father     Current Outpatient Medications on File Prior to Visit  Medication Sig Dispense Refill  . amLODipine (NORVASC) 10 MG tablet     . amLODipine (NORVASC) 5 MG tablet Take 5 mg by mouth daily.    . Ascorbic Acid (VITAMIN C PO) Take by mouth.    Marland Kitchen aspirin 81 MG tablet Take 81 mg by mouth daily.    Marland Kitchen atorvastatin (LIPITOR) 80 MG tablet Take 1 tablet (80 mg total) by mouth at bedtime. 30 tablet 3  . carvedilol (COREG) 12.5 MG tablet Take 12.5 mg  by mouth 2 (two) times daily with a meal.    . levothyroxine (SYNTHROID, LEVOTHROID) 75 MCG tablet Take 75 mcg by mouth daily before breakfast.    . losartan (COZAAR) 100 MG tablet Take 100 mg by mouth daily.    . Multiple Vitamin (MULTIVITAMIN) tablet Take 1 tablet daily by mouth.    . Omega-3 Fatty Acids (FISH OIL PO) Take by mouth.    . spironolactone (ALDACTONE) 25 MG tablet Take 25 mg by mouth daily.    . ticagrelor (BRILINTA) 90 MG TABS tablet Take 1 tablet (90 mg total) by mouth 2 (two) times daily. 60 tablet 3   No current facility-administered medications on file prior to visit.    Allergies  Allergen Reactions  . Quinapril Cough    ROS: See HPI for pertinent positives and negatives.  Physical Examination  Vitals:   08/24/17 1017 08/24/17 1020  BP: (!) 155/90 (!) 156/83  Pulse: 86   Resp: 19   Temp: 97.6 F (36.4 C)   TempSrc: Oral   SpO2: 99%   Weight: 230 lb 1.6 oz (104.4 kg)    Body mass index is 37.14 kg/m.  General: A&O x 3, WD, obese male.  Pulmonary: Sym exp, respirations are non labored, good air movt, CTAB, no rales, rhonchi, or wheezing.  Cardiac: RRR, Nl S1, S2, no detected murmur.   Carotid Bruits Right Left   Negative Negative   Abdominal aortic pulse is not palpable Radial pulses are 2+ palpable and =                          VASCULAR EXAM:                                                                                                                                           LE Pulses Right Left       FEMORAL  2+ palpable   2+palpable        POPLITEAL  not palpable   not palpable       POSTERIOR TIBIAL  not palpable   not palpable        DORSALIS PEDIS      ANTERIOR TIBIAL not palpable  not palpable      Gastrointestinal: soft, NTND, -G/R, - HSM, - masses palpated, - CVAT B.  Musculoskeletal: M/S 5/5 throughout, Extremities without ischemic changes.  Skin: No rashes, no ulcers, no cellulitis. 1+ bilateral  pretibial pitting edema.   Neurologic: CN 2-12 intact, Pain and light touch intact in extremities are intact, Motor exam as listed above     DATA  AAA Duplex (08/24/2017):  Previous size: 4.18 cm (Date: 08-18-16), based on limited visualization due to overlying bowel gas and pt body habitus. Bilateral iliac arteries not visualized.   Current size:  4.05 cm (Date: 08/24/17). Limited visualization of the abdominal vasculature due to overlying bowel gas. Bilateral common iliac arteries not visualized.   No significant change compared to the exam on 08-18-16.    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    CTA ABD/PELVIS 08-13-15: 1. Fusiform infrarenal abdominal aortic aneurysm measuring 3.9 x 4.1 cm in greatest respective width and AP dimensions. Infrarenal aneurysm neck measures 2 x 2.5 cm. 2. 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 3. Approximately 50% narrowing in the proximal trunk of the superior mesenteric artery. 4. Approximately 50% stenosis of the right renal artery. 5. 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 The origin of the right SFA is chronically narrowed with the visualized proximal SFA nearly occluded. The profunda femoral artery appears normally patent on the right. 6. 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 Diffuse disease of the visualized left SFA including origin stenosis of at least 70%. 7. Significant degenerative disc disease in the lumbar spine.    Medical Decision Making  The patient is a 76 y.o. male who presents with asymptomatic AAA with no increase in  size, based on limited visualization: 4.05 cm today.  In Dr. Estanislado SpireBrabham's 08/13/15 assessment: At the time of repair if pt is an endovascular candidate, I will need to evaluate the possible stenosis in his right femoral artery.  His pedal pulses are not palpable, but his feet are warm, all toes have brisk capillary refill, no signs of ischemia in his feet/legs. He c/o right hip pain, does not seem to indicate claudication sx's in his legs with walking. Bilateral femoral pulses are 2+ palpable.  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   He does ride a stationary bike for an hour 5 days/week.    Based on this patient's exam and diagnostic studies, the patient will follow up in 1 year  with the following studies: AAA duplex and ABI's.  Consideration for repair of AAA would be made when the size is 5.0 cm, growth > 1 cm/yr, and symptomatic status.        The patient was given information about AAA including signs, symptoms, treatment,  and how to minimize the  risk of enlargement and rupture of aneurysms.    I emphasized the importance of maximal medical management including strict control of blood pressure, blood glucose, and lipid levels, antiplatelet agents, obtaining regular exercise, and continued  cessation of smoking.   The patient was advised to call 911 should the patient experience sudden onset abdominal or back pain.   Thank you for allowing [place] to participate in this patient's care.  Charisse March, RN, MSN, FNP-C [facility] [place] Office: [phone]  Clinic Physician: Myra Gianotti  08/24/2017, 10:47 AM

## 2017-08-24 NOTE — Patient Instructions (Addendum)
Before your next abdominal ultrasound:  Take two Extra-Strength Gas-X capsules at bedtime the night before the test. Take another two Extra-Strength Gas-X capsules 3 hours before the test.  Avoid gas forming foods the day before the test.      Abdominal Aortic Aneurysm Blood pumps away from the heart through tubes (blood vessels) called arteries. Aneurysms are weak or damaged places in the wall of an artery. It bulges out like a balloon. An abdominal aortic aneurysm happens in the main artery of the body (aorta). It can burst or tear, causing bleeding inside the body. This is an emergency. It needs treatment right away. What are the causes? The exact cause is unknown. Things that could cause this problem include:  Fat and other substances building up in the lining of a tube.  Swelling of the walls of a blood vessel.  Certain tissue diseases.  Belly (abdominal) trauma.  An infection in the main artery of the body.  What increases the risk? There are things that make it more likely for you to have an aneurysm. These include:  Being over the age of 77 years old.  Having high blood pressure (hypertension).  Being a male.  Being white.  Being very overweight (obese).  Having a family history of aneurysm.  Using tobacco products.  What are the signs or symptoms? Symptoms depend on the size of the aneurysm and how fast it grows. There may not be symptoms. If symptoms occur, they can include:  Pain (belly, side, lower back, or groin).  Feeling full after eating a small amount of food.  Feeling sick to your stomach (nauseous), throwing up (vomiting), or both.  Feeling a lump in your belly that feels like it is beating (pulsating).  Feeling like you will pass out (faint).  How is this treated?  Medicine to control blood pressure and pain.  Imaging tests to see if the aneurysm gets bigger.  Surgery. How is this prevented? To lessen your chance of getting this  condition:  Stop smoking. Stop chewing tobacco.  Limit or avoid alcohol.  Keep your blood pressure, blood sugar, and cholesterol within normal limits.  Eat less salt.  Eat foods low in saturated fats and cholesterol. These are found in animal and whole dairy products.  Eat more fiber. Fiber is found in whole grains, vegetables, and fruits.  Keep a healthy weight.  Stay active and exercise often.  This information is not intended to replace advice given to you by your health care provider. Make sure you discuss any questions you have with your health care provider. Document Released: 01/17/2013 Document Revised: 02/28/2016 Document Reviewed: 10/22/2012 Elsevier Interactive Patient Education  2017 [employer]      Peripheral Vascular Disease Peripheral vascular disease (PVD) is a disease of the blood vessels that are not part of your heart and brain. A simple term for PVD is poor circulation. In most cases, PVD narrows the blood vessels that carry blood from your heart to the rest of your body. This can result in a decreased supply of blood to your arms, legs, and internal organs, like your stomach or kidneys. However, it most often affects a person's lower legs and feet. There are two types of PVD.  Organic PVD. This is the more common type. It is caused by damage to the structure of blood vessels.  Functional PVD. This is caused by conditions that make blood vessels contract and tighten (spasm).  Without treatment, PVD tends to get worse over   time. PVD can also lead to acute ischemic limb. This is when an arm or limb suddenly has trouble getting enough blood. This is a medical emergency. Follow these instructions at home:  Take medicines only as told by your doctor.  Do not use any tobacco products, including cigarettes, chewing tobacco, or electronic cigarettes. If you need help quitting, ask your doctor.  Lose weight if you are overweight, and maintain a healthy weight as  told by your doctor.  Eat a diet that is low in fat and cholesterol. If you need help, ask your doctor.  Exercise regularly. Ask your doctor for some good activities for you.  Take good care of your feet. ? Wear comfortable shoes that fit well. ? Check your feet often for any cuts or sores. Contact a doctor if:  You have cramps in your legs while walking.  You have leg pain when you are at rest.  You have coldness in a leg or foot.  Your skin changes.  You are unable to get or have an erection (erectile dysfunction).  You have cuts or sores on your feet that are not healing. Get help right away if:  Your arm or leg turns cold and blue.  Your arms or legs become red, warm, swollen, painful, or numb.  You have chest pain or trouble breathing.  You suddenly have weakness in your face, arm, or leg.  You become very confused or you cannot speak.  You suddenly have a very bad headache.  You suddenly cannot see. This information is not intended to replace advice given to you by your health care provider. Make sure you discuss any questions you have with your health care provider. Document Released: 12/17/2009 Document Revised: 02/28/2016 Document Reviewed: 03/02/2014 Elsevier Interactive Patient Education  2017 [employer]    To measure for knee high compression hose: Measure the length of calf (from the crease of the knee to the bottom of the heel), largest circumference of calf, and ankle circumference first thing in the morning before your legs have a chance to swell.  Take these 3 measurements with you to obtain 20-30 mm mercury graduated knee high compression hose.  Put the stockings on in the morning, remove at bedtime.

## 2017-08-26 NOTE — Addendum Note (Signed)
Addended by: Burton Apley A on: 08/26/2017 10:56 AM   Modules accepted: Orders

## 2017-10-06 DIAGNOSIS — G4730 Sleep apnea, unspecified: Secondary | ICD-10-CM

## 2017-10-06 HISTORY — DX: Sleep apnea, unspecified: G47.30

## 2017-10-28 ENCOUNTER — Other Ambulatory Visit: Payer: Self-pay | Admitting: Cardiology

## 2017-10-28 DIAGNOSIS — R079 Chest pain, unspecified: Secondary | ICD-10-CM

## 2017-11-06 ENCOUNTER — Ambulatory Visit (HOSPITAL_COMMUNITY)
Admission: RE | Admit: 2017-11-06 | Discharge: 2017-11-06 | Disposition: A | Discharge status: Home / Self Care | Payer: Medicare HMO | Source: Ambulatory Visit | Attending: Cardiology | Admitting: Cardiology

## 2017-11-06 DIAGNOSIS — R079 Chest pain, unspecified: Secondary | ICD-10-CM | POA: Diagnosis present

## 2017-11-06 DIAGNOSIS — I501 Left ventricular failure: Secondary | ICD-10-CM | POA: Insufficient documentation

## 2017-11-06 MED ORDER — TECHNETIUM TC 99M TETROFOSMIN IV KIT
10.0000 | PACK | Freq: Once | INTRAVENOUS | Status: AC | PRN
  Administered 2017-11-06: 10 via INTRAVENOUS

## 2017-11-06 MED ORDER — REGADENOSON 0.4 MG/5ML IV SOLN
0.4000 mg | Freq: Once | INTRAVENOUS | Status: AC
  Administered 2017-11-06: 0.4 mg via INTRAVENOUS

## 2017-11-06 MED ORDER — REGADENOSON 0.4 MG/5ML IV SOLN
INTRAVENOUS | Status: AC
  Filled 2017-11-06: qty 5

## 2017-11-12 ENCOUNTER — Ambulatory Visit (HOSPITAL_COMMUNITY)
Admission: RE | Admit: 2017-11-12 | Discharge: 2017-11-13 | Disposition: A | Discharge status: Home / Self Care | Payer: Medicare HMO | Source: Ambulatory Visit | Attending: Cardiology | Admitting: Cardiology

## 2017-11-12 ENCOUNTER — Encounter (HOSPITAL_COMMUNITY): Payer: Self-pay | Admitting: Cardiology

## 2017-11-12 ENCOUNTER — Encounter (HOSPITAL_COMMUNITY)
Admission: RE | Disposition: A | Discharge status: Home / Self Care | Payer: Self-pay | Source: Ambulatory Visit | Attending: Cardiology

## 2017-11-12 ENCOUNTER — Other Ambulatory Visit: Payer: Self-pay

## 2017-11-12 DIAGNOSIS — Z6836 Body mass index (BMI) 36.0-36.9, adult: Secondary | ICD-10-CM | POA: Diagnosis not present

## 2017-11-12 DIAGNOSIS — E785 Hyperlipidemia, unspecified: Secondary | ICD-10-CM | POA: Diagnosis not present

## 2017-11-12 DIAGNOSIS — T82855A Stenosis of coronary artery stent, initial encounter: Secondary | ICD-10-CM | POA: Diagnosis not present

## 2017-11-12 DIAGNOSIS — I129 Hypertensive chronic kidney disease with stage 1 through stage 4 chronic kidney disease, or unspecified chronic kidney disease: Secondary | ICD-10-CM | POA: Diagnosis not present

## 2017-11-12 DIAGNOSIS — I252 Old myocardial infarction: Secondary | ICD-10-CM | POA: Diagnosis not present

## 2017-11-12 DIAGNOSIS — Z8249 Family history of ischemic heart disease and other diseases of the circulatory system: Secondary | ICD-10-CM | POA: Insufficient documentation

## 2017-11-12 DIAGNOSIS — I209 Angina pectoris, unspecified: Secondary | ICD-10-CM | POA: Diagnosis present

## 2017-11-12 DIAGNOSIS — I255 Ischemic cardiomyopathy: Secondary | ICD-10-CM | POA: Diagnosis not present

## 2017-11-12 DIAGNOSIS — Y831 Surgical operation with implant of artificial internal device as the cause of abnormal reaction of the patient, or of later complication, without mention of misadventure at the time of the procedure: Secondary | ICD-10-CM | POA: Diagnosis not present

## 2017-11-12 DIAGNOSIS — Z7902 Long term (current) use of antithrombotics/antiplatelets: Secondary | ICD-10-CM | POA: Diagnosis not present

## 2017-11-12 DIAGNOSIS — Z7982 Long term (current) use of aspirin: Secondary | ICD-10-CM | POA: Diagnosis not present

## 2017-11-12 DIAGNOSIS — N189 Chronic kidney disease, unspecified: Secondary | ICD-10-CM | POA: Diagnosis not present

## 2017-11-12 DIAGNOSIS — I25119 Atherosclerotic heart disease of native coronary artery with unspecified angina pectoris: Secondary | ICD-10-CM | POA: Insufficient documentation

## 2017-11-12 DIAGNOSIS — I714 Abdominal aortic aneurysm, without rupture: Secondary | ICD-10-CM | POA: Diagnosis not present

## 2017-11-12 DIAGNOSIS — E039 Hypothyroidism, unspecified: Secondary | ICD-10-CM | POA: Diagnosis not present

## 2017-11-12 HISTORY — PX: CORONARY BALLOON ANGIOPLASTY: CATH118233

## 2017-11-12 HISTORY — PX: CORONARY ANGIOGRAPHY: CATH118303

## 2017-11-12 HISTORY — DX: Unspecified osteoarthritis, unspecified site: M19.90

## 2017-11-12 LAB — POCT ACTIVATED CLOTTING TIME: Activated Clotting Time: 417 seconds

## 2017-11-12 SURGERY — CORONARY BALLOON ANGIOPLASTY
Anesthesia: LOCAL

## 2017-11-12 MED ORDER — SODIUM CHLORIDE 0.9% FLUSH: 3.0000 mL | Freq: Two times a day (BID) | INTRAVENOUS | Status: DC

## 2017-11-12 MED ORDER — HEPARIN (PORCINE) IN NACL 2-0.9 UNIT/ML-% IJ SOLN
INTRAMUSCULAR | Status: AC | PRN
  Administered 2017-11-12: 1500 mL

## 2017-11-12 MED ORDER — SODIUM CHLORIDE 0.9 % WEIGHT BASED INFUSION
3.0000 mL/kg/h | INTRAVENOUS | Status: DC
  Administered 2017-11-12: 3 mL/kg/h via INTRAVENOUS

## 2017-11-12 MED ORDER — NITROGLYCERIN 1 MG/10 ML FOR IR/CATH LAB
INTRA_ARTERIAL | Status: DC | PRN
  Administered 2017-11-12 (×2): 150 ug via INTRACORONARY

## 2017-11-12 MED ORDER — TICAGRELOR 90 MG PO TABS
ORAL_TABLET | ORAL | Status: AC
  Filled 2017-11-12: qty 1

## 2017-11-12 MED ORDER — TAMSULOSIN HCL 0.4 MG PO CAPS
0.4000 mg | ORAL_CAPSULE | Freq: Every day | ORAL | Status: DC
  Administered 2017-11-13: 07:00:00 0.4 mg via ORAL
  Filled 2017-11-12 (×2): qty 1

## 2017-11-12 MED ORDER — FENTANYL CITRATE (PF) 100 MCG/2ML IJ SOLN
INTRAMUSCULAR | Status: DC | PRN
  Administered 2017-11-12 (×3): 25 ug via INTRAVENOUS

## 2017-11-12 MED ORDER — SODIUM CHLORIDE 0.9 % IV SOLN: 250.0000 mL | INTRAVENOUS | Status: DC | PRN

## 2017-11-12 MED ORDER — TICAGRELOR 90 MG PO TABS
ORAL_TABLET | ORAL | Status: DC | PRN
  Administered 2017-11-12: 90 mg via ORAL

## 2017-11-12 MED ORDER — ACETAMINOPHEN 325 MG PO TABS: 650.0000 mg | ORAL_TABLET | ORAL | Status: DC | PRN

## 2017-11-12 MED ORDER — ASPIRIN 81 MG PO CHEW
CHEWABLE_TABLET | ORAL | Status: AC
  Administered 2017-11-12: 81 mg via ORAL
  Filled 2017-11-12: qty 1

## 2017-11-12 MED ORDER — NITROGLYCERIN IN D5W 200-5 MCG/ML-% IV SOLN
INTRAVENOUS | Status: AC
  Filled 2017-11-12: qty 250

## 2017-11-12 MED ORDER — MIDAZOLAM HCL 2 MG/2ML IJ SOLN
INTRAMUSCULAR | Status: AC
  Filled 2017-11-12: qty 2

## 2017-11-12 MED ORDER — SODIUM CHLORIDE 0.9 % IV SOLN: INTRAVENOUS | Status: AC

## 2017-11-12 MED ORDER — MIDAZOLAM HCL 2 MG/2ML IJ SOLN
INTRAMUSCULAR | Status: DC | PRN
  Administered 2017-11-12 (×3): 1 mg via INTRAVENOUS

## 2017-11-12 MED ORDER — HEPARIN (PORCINE) IN NACL 2-0.9 UNIT/ML-% IJ SOLN
INTRAMUSCULAR | Status: AC
  Filled 2017-11-12: qty 500

## 2017-11-12 MED ORDER — ONDANSETRON HCL 4 MG/2ML IJ SOLN: 4.0000 mg | Freq: Four times a day (QID) | INTRAMUSCULAR | Status: DC | PRN

## 2017-11-12 MED ORDER — ASPIRIN 81 MG PO CHEW
81.0000 mg | CHEWABLE_TABLET | ORAL | Status: AC
  Administered 2017-11-12: 81 mg via ORAL

## 2017-11-12 MED ORDER — FINASTERIDE 5 MG PO TABS
5.0000 mg | ORAL_TABLET | Freq: Every day | ORAL | Status: DC
  Administered 2017-11-12 – 2017-11-13 (×2): 5 mg via ORAL
  Filled 2017-11-12 (×3): qty 1

## 2017-11-12 MED ORDER — BIVALIRUDIN TRIFLUOROACETATE 250 MG IV SOLR
INTRAVENOUS | Status: AC
  Filled 2017-11-12: qty 250

## 2017-11-12 MED ORDER — LIDOCAINE HCL (PF) 1 % IJ SOLN
INTRAMUSCULAR | Status: DC | PRN
  Administered 2017-11-12: 16 mL

## 2017-11-12 MED ORDER — ASPIRIN EC 81 MG PO TBEC
81.0000 mg | DELAYED_RELEASE_TABLET | Freq: Every day | ORAL | Status: DC
  Administered 2017-11-13: 07:00:00 81 mg via ORAL
  Filled 2017-11-12: qty 1

## 2017-11-12 MED ORDER — IOHEXOL 350 MG/ML SOLN
INTRAVENOUS | Status: DC | PRN
  Administered 2017-11-12: 170 mL

## 2017-11-12 MED ORDER — LEVOTHYROXINE SODIUM 75 MCG PO TABS
75.0000 ug | ORAL_TABLET | Freq: Every day | ORAL | Status: DC
  Administered 2017-11-13: 06:00:00 75 ug via ORAL
  Filled 2017-11-12 (×2): qty 1

## 2017-11-12 MED ORDER — SODIUM CHLORIDE 0.9 % IV SOLN
INTRAVENOUS | Status: AC | PRN
  Administered 2017-11-12: 09:00:00
  Administered 2017-11-12: 1.75 mg/kg/h via INTRAVENOUS

## 2017-11-12 MED ORDER — ATORVASTATIN CALCIUM 80 MG PO TABS
80.0000 mg | ORAL_TABLET | Freq: Every day | ORAL | Status: DC
  Administered 2017-11-12: 21:00:00 80 mg via ORAL
  Filled 2017-11-12: qty 1

## 2017-11-12 MED ORDER — SODIUM CHLORIDE 0.9 % WEIGHT BASED INFUSION
1.0000 mL/kg/h | INTRAVENOUS | Status: DC
  Administered 2017-11-12 (×2): 250 mL via INTRAVENOUS

## 2017-11-12 MED ORDER — HEPARIN (PORCINE) IN NACL 2-0.9 UNIT/ML-% IJ SOLN
INTRAMUSCULAR | Status: AC
  Filled 2017-11-12: qty 1000

## 2017-11-12 MED ORDER — TICAGRELOR 90 MG PO TABS
90.0000 mg | ORAL_TABLET | Freq: Once | ORAL | Status: AC
Start: 2017-11-12 — End: 2017-11-12
  Administered 2017-11-12: 90 mg via ORAL

## 2017-11-12 MED ORDER — TICAGRELOR 90 MG PO TABS
90.0000 mg | ORAL_TABLET | Freq: Two times a day (BID) | ORAL | Status: DC
  Administered 2017-11-12 – 2017-11-13 (×2): 90 mg via ORAL
  Filled 2017-11-12 (×2): qty 1

## 2017-11-12 MED ORDER — SODIUM CHLORIDE 0.9% FLUSH: 3.0000 mL | INTRAVENOUS | Status: DC | PRN

## 2017-11-12 MED ORDER — BIVALIRUDIN BOLUS VIA INFUSION - CUPID
INTRAVENOUS | Status: DC | PRN
  Administered 2017-11-12: 75.525 mg via INTRAVENOUS

## 2017-11-12 MED ORDER — NITROGLYCERIN IN D5W 200-5 MCG/ML-% IV SOLN
INTRAVENOUS | Status: AC | PRN
  Administered 2017-11-12: 5 ug/min via INTRAVENOUS

## 2017-11-12 MED ORDER — CARVEDILOL 12.5 MG PO TABS
12.5000 mg | ORAL_TABLET | Freq: Two times a day (BID) | ORAL | Status: DC
  Administered 2017-11-12 – 2017-11-13 (×2): 12.5 mg via ORAL
  Filled 2017-11-12 (×2): qty 1

## 2017-11-12 MED ORDER — FENTANYL CITRATE (PF) 100 MCG/2ML IJ SOLN
INTRAMUSCULAR | Status: AC
  Filled 2017-11-12: qty 2

## 2017-11-12 MED ORDER — NITROGLYCERIN IN D5W 200-5 MCG/ML-% IV SOLN: 5.0000 ug/min | INTRAVENOUS | Status: DC

## 2017-11-12 MED ORDER — LIDOCAINE HCL (PF) 1 % IJ SOLN
INTRAMUSCULAR | Status: AC
  Filled 2017-11-12: qty 30

## 2017-11-12 SURGICAL SUPPLY — 27 items
BALLN EMERGE MR 1.5X12 (BALLOONS) ×2
BALLN EMERGE MR 2.0X8 (BALLOONS) ×4
BALLN EMERGE MR 2.5X8 (BALLOONS) ×2
BALLN EMERGE MR PUSH 1.5X8 (BALLOONS) ×2
BALLN WOLVERINE 2.50X6 (BALLOONS) ×2
BALLN WOLVERINE 3.00X6 (BALLOONS) ×2
BALLN ~~LOC~~ EMERGE MR 2.5X8 (BALLOONS) ×2
BALLOON EMERGE MR 1.5X12 (BALLOONS) IMPLANT
BALLOON EMERGE MR 2.0X8 (BALLOONS) IMPLANT
BALLOON EMERGE MR 2.5X8 (BALLOONS) IMPLANT
BALLOON EMERGE MR PUSH 1.5X8 (BALLOONS) IMPLANT
BALLOON WOLVERINE 2.50X6 (BALLOONS) IMPLANT
BALLOON WOLVERINE 3.00X6 (BALLOONS) IMPLANT
BALLOON ~~LOC~~ EMERGE MR 2.5X8 (BALLOONS) IMPLANT
CATH INFINITI 5FR MULTPACK ANG (CATHETERS) ×1 IMPLANT
CATH MACH 1 7FR CLS3.5 (CATHETERS) ×1 IMPLANT
KIT ENCORE 26 ADVANTAGE (KITS) ×1 IMPLANT
KIT HEART LEFT (KITS) ×2 IMPLANT
PACK CARDIAC CATHETERIZATION (CUSTOM PROCEDURE TRAY) ×2 IMPLANT
PINNACLE LONG 7F 25CM (SHEATH) ×2
SHEATH INTRO PINNACLE 7F 25CM (SHEATH) IMPLANT
SHEATH PINNACLE 5F 10CM (SHEATH) ×1 IMPLANT
SHEATH PINNACLE 7F 10CM (SHEATH) IMPLANT
TRANSDUCER W/STOPCOCK (MISCELLANEOUS) ×2 IMPLANT
WIRE EMERALD 3MM-J .035X150CM (WIRE) ×1 IMPLANT
WIRE PT2 MS 185 (WIRE) ×1 IMPLANT
WIRE RUNTHROUGH .014X180CM (WIRE) ×1 IMPLANT

## 2017-11-12 NOTE — Progress Notes (Signed)
Site area: right groin  Site Prior to Removal:  Level 0  Pressure Applied For 20 MINUTES    Minutes Beginning at 1325  Manual:   Yes.    Patient Status During Pull:  stable  Post Pull Groin Site:  Level 0  Post Pull Instructions Given:  Yes.    Post Pull Pulses Present:  Yes.    Dressing Applied:  Yes.    Comments:  Rechecked during shift with no change noted in assessment, dressing remains dry and intact

## 2017-11-12 NOTE — Interval H&P Note (Signed)
Cath Lab Visit (complete for each Cath Lab visit)  Clinical Evaluation Leading to the Procedure:   ACS: No.  Non-ACS:    Anginal Classification: CCS III  Anti-ischemic medical therapy: Maximal Therapy (2 or more classes of medications)  Non-Invasive Test Results: Intermediate-risk stress test findings: cardiac mortality 1-3%/year  Prior CABG: No previous CABG      History and Physical Interval Note:  11/12/2017 7:25 AM  Bryan Carter  has presented today for surgery, with the diagnosis of abn stress  The various methods of treatment have been discussed with the patient and family. After consideration of risks, benefits and other options for treatment, the patient has consented to  Procedure(s): LEFT HEART CATH AND CORONARY ANGIOGRAPHY (N/A) as a surgical intervention .  The patient's history has been reviewed, patient examined, no change in status, stable for surgery.  I have reviewed the patient's chart and labs.  Questions were answered to the patient's satisfaction.     Rinaldo Cloud

## 2017-11-12 NOTE — H&P (Signed)
The printed H&P in the chart needs to be scanned 

## 2017-11-12 NOTE — Care Management Note (Signed)
Case Management Note  Patient Details  Name: Bryan Carter MRN: [id number] Date of Birth: 1940/12/12  Subjective/Objective:  From home with spouse, s/p coronary balloon angioplasty, will be on brilinta,which he was taking pta.                     Action/Plan: NCM will follow for transition care needs.   Expected Discharge Date:                  Expected Discharge Plan:  Home/Self Care  In-House Referral:     Discharge planning Services  CM Consult  Post Acute Care Choice:    Choice offered to:     DME Arranged:    DME Agency:     HH Arranged:    HH Agency:     Status of Service:  Completed, signed off  If discussed at [employer] of Stay Meetings, dates discussed:    Additional Comments:  Leone Haven, RN 11/12/2017, 3:43 PM

## 2017-11-13 DIAGNOSIS — T82855A Stenosis of coronary artery stent, initial encounter: Secondary | ICD-10-CM | POA: Diagnosis not present

## 2017-11-13 DIAGNOSIS — I25119 Atherosclerotic heart disease of native coronary artery with unspecified angina pectoris: Secondary | ICD-10-CM | POA: Diagnosis not present

## 2017-11-13 DIAGNOSIS — I255 Ischemic cardiomyopathy: Secondary | ICD-10-CM | POA: Diagnosis not present

## 2017-11-13 DIAGNOSIS — I252 Old myocardial infarction: Secondary | ICD-10-CM | POA: Diagnosis not present

## 2017-11-13 LAB — CBC
HEMATOCRIT: 33.5 % — AB (ref 39.0–52.0)
HEMOGLOBIN: 11.1 g/dL — AB (ref 13.0–17.0)
MCH: 30.7 pg (ref 26.0–34.0)
MCHC: 33.1 g/dL (ref 30.0–36.0)
MCV: 92.5 fL (ref 78.0–100.0)
Platelets: 178 10*3/uL (ref 150–400)
RBC: 3.62 MIL/uL — ABNORMAL LOW (ref 4.22–5.81)
RDW: 15.1 % (ref 11.5–15.5)
WBC: 5.7 10*3/uL (ref 4.0–10.5)

## 2017-11-13 LAB — BASIC METABOLIC PANEL
Anion gap: 11 (ref 5–15)
BUN: 16 mg/dL (ref 6–20)
CHLORIDE: 110 mmol/L (ref 101–111)
CO2: 21 mmol/L — ABNORMAL LOW (ref 22–32)
CREATININE: 1.38 mg/dL — AB (ref 0.61–1.24)
Calcium: 9.3 mg/dL (ref 8.9–10.3)
GFR calc Af Amer: 55 mL/min — ABNORMAL LOW (ref >60)
GFR, EST NON AFRICAN AMERICAN: 48 mL/min — AB (ref >60)
Glucose, Bld: 96 mg/dL (ref 65–99)
POTASSIUM: 4 mmol/L (ref 3.5–5.1)
SODIUM: 142 mmol/L (ref 135–145)

## 2017-11-13 MED ORDER — AMLODIPINE BESYLATE 10 MG PO TABS: 5.0000 mg | ORAL_TABLET | Freq: Every day | ORAL | 3 refills | Status: DC

## 2017-11-13 MED ORDER — ANGIOPLASTY BOOK
Freq: Once | Status: AC
  Administered 2017-11-13: 05:00:00
  Filled 2017-11-13: qty 1

## 2017-11-13 MED ORDER — NITROGLYCERIN 0.4 MG SL SUBL: 0.4000 mg | SUBLINGUAL_TABLET | SUBLINGUAL | 3 refills | Status: AC | PRN

## 2017-11-13 NOTE — Discharge Summary (Signed)
Discharge summary dictated on 11/13/2017, dictation number is [id number]

## 2017-11-13 NOTE — Progress Notes (Signed)
CARDIAC REHAB PHASE I   PRE:  Rate/Rhythm: 68 SR  BP:  Supine: 148/75  Sitting:   Standing:    SaO2:   MODE:  Ambulation: 400 ft   POST:  Rate/Rhythm: 100 SR  BP:  Supine:   Sitting: 177/57  Standing:    SaO2: 100%RA 0850-0950 Pt walked 400 ft with steady gait. Stopped once to rest. No CP but DOE noted. Pt stated he has not walked much in a while. Discussed brilinta with stent, NTG use, walking for ex, heart healthy food choices and decreasing portions and CRP 2. Pt attended [place] CRP 2 for 6 weeks. He will consider. Will refer to [place].   Luetta Nutting, RN BSN  11/13/2017 9:45 AM

## 2017-11-13 NOTE — Discharge Instructions (Signed)
Coronary Angiogram With Stent °Coronary angiogram with stent placement is a procedure to widen or open a narrow blood vessel of the heart (coronary artery). Arteries may become blocked by cholesterol buildup (plaques) in the lining of the wall. When a coronary artery becomes partially blocked, blood flow to that area decreases. This may lead to chest pain or a heart attack (myocardial infarction). °A stent is a small piece of metal that looks like mesh or a spring. Stent placement may be done as treatment for a heart attack or right after a coronary angiogram in which a blocked artery is found. °Let your health care provider know about: °· Any allergies you have. °· All medicines you are taking, including vitamins, herbs, eye drops, creams, and over-the-counter medicines. °· Any problems you or family members have had with anesthetic medicines. °· Any blood disorders you have. °· Any surgeries you have had. °· Any medical conditions you have. °· Whether you are pregnant or may be pregnant. °What are the risks? °Generally, this is a safe procedure. However, problems may occur, including: °· Damage to the heart or its blood vessels. °· A return of blockage. °· Bleeding, infection, or bruising at the insertion site. °· A collection of blood under the skin (hematoma) at the insertion site. °· A blood clot in another part of the body. °· Kidney injury. °· Allergic reaction to the dye or contrast that is used. °· Bleeding into the abdomen (retroperitoneal bleeding). ° °What happens before the procedure? °Staying hydrated °Follow instructions from your health care provider about hydration, which may include: °· Up to 2 hours before the procedure - you may continue to drink clear liquids, such as water, clear fruit juice, black coffee, and plain tea. ° °Eating and drinking restrictions °Follow instructions from your health care provider about eating and drinking, which may include: °· 8 hours before the procedure - stop  eating heavy meals or foods such as meat, fried foods, or fatty foods. °· 6 hours before the procedure - stop eating light meals or foods, such as toast or cereal. °· 2 hours before the procedure - stop drinking clear liquids. ° °Ask your health care provider about: °· Changing or stopping your regular medicines. This is especially important if you are taking diabetes medicines or blood thinners. °· Taking medicines such as ibuprofen. These medicines can thin your blood. Do not take these medicines before your procedure if your health care provider instructs you not to. Generally, aspirin is recommended before a procedure of passing a small, thin tube (catheter) through a blood vessel and into the heart (cardiac catheterization). ° °What happens during the procedure? °· An IV tube will be inserted into one of your veins. °· You will be given one or more of the following: °? A medicine to help you relax (sedative). °? A medicine to numb the area where the catheter will be inserted into an artery (local anesthetic). °· To reduce your risk of infection: °? Your health care team will wash or sanitize their hands. °? Your skin will be washed with soap. °? Hair may be removed from the area where the catheter will be inserted. °· Using a guide wire, the catheter will be inserted into an artery. The location may be in your groin, in your wrist, or in the fold of your arm (near your elbow). °· A type of X-ray (fluoroscopy) will be used to help guide the catheter to the opening of the arteries in the heart. °·   A dye will be injected into the catheter, and X-rays will be taken. The dye will help to show where any narrowing or blockages are located in the arteries.  A tiny wire will be guided to the blocked spot, and a balloon will be inflated to make the artery wider.  The stent will be expanded and will crush the plaques into the wall of the vessel. The stent will hold the area open and improve the blood flow. Most stents  have a drug coating to reduce the risk of the stent narrowing over time.  The artery may be made wider using a drill, laser, or other tools to remove plaques.  When the blood flow is better, the catheter will be removed. The lining of the artery will grow over the stent, which stays where it was placed. This procedure may vary among health care providers and hospitals. What happens after the procedure?  If the procedure is done through the leg, you will be kept in bed lying flat for about 6 hours. You will be instructed to not bend and not cross your legs.  The insertion site will be checked frequently.  The pulse in your foot or wrist will be checked frequently.  You may have additional blood tests, X-rays, and a test that records the electrical activity of your heart (electrocardiogram, or ECG). This information is not intended to replace advice given to you by your health care provider. Make sure you discuss any questions you have with your health care provider. Document Released: 03/29/2003 Document Revised: 05/22/2016 Document Reviewed: 04/27/2016 Elsevier Interactive Patient Education  2018 [employer]. Coronary Angiogram A coronary angiogram is an X-ray procedure that is used to examine the arteries in the heart. In this procedure, a dye (contrast dye) is injected through a long, thin tube (catheter). The catheter is inserted through the groin, wrist, or arm. The dye is injected into each artery, then X-rays are taken to show if there is a blockage in the arteries of the heart. This procedure can also show if you have valve disease or a disease of the aorta, and it can be used to check the overall function of your heart muscle. You may have a coronary angiogram if:  You are having chest pain, or other symptoms of angina, and you are at risk for heart disease.  You have an abnormal electrocardiogram (ECG) or stress test.  You have chest pain and heart failure.  You are having  irregular heart rhythms.  You and your health care provider determine that the benefits of the test information outweigh the risks of the procedure.  Let your health care provider know about:  Any allergies you have, including allergies to contrast dye.  All medicines you are taking, including vitamins, herbs, eye drops, creams, and over-the-counter medicines.  Any problems you or family members have had with anesthetic medicines.  Any blood disorders you have.  Any surgeries you have had.  History of kidney problems or kidney failure.  Any medical conditions you have.  Whether you are pregnant or may be pregnant. What are the risks? Generally, this is a safe procedure. However, problems may occur, including:  Infection.  Allergic reaction to medicines or dyes that are used.  Bleeding from the access site or other locations.  Kidney injury, especially in people with impaired kidney function.  Stroke (rare).  Heart attack (rare).  Damage to other structures or organs.  What happens before the procedure? Staying hydrated Follow instructions from  your health care provider about hydration, which may include:  Up to 2 hours before the procedure - you may continue to drink clear liquids, such as water, clear fruit juice, black coffee, and plain tea.  Eating and drinking restrictions Follow instructions from your health care provider about eating and drinking, which may include:  8 hours before the procedure - stop eating heavy meals or foods such as meat, fried foods, or fatty foods.  6 hours before the procedure - stop eating light meals or foods, such as toast or cereal.  2 hours before the procedure - stop drinking clear liquids.  General instructions  Ask your health care provider about: ? Changing or stopping your regular medicines. This is especially important if you are taking diabetes medicines or blood thinners. ? Taking medicines such as ibuprofen. These  medicines can thin your blood. Do not take these medicines before your procedure if your health care provider instructs you not to, though aspirin may be recommended prior to coronary angiograms.  Plan to have someone take you home from the hospital or clinic.  You may need to have blood tests or X-rays done. What happens during the procedure?  An IV tube will be inserted into one of your veins.  You will be given one or more of the following: ? A medicine to help you relax (sedative). ? A medicine to numb the area where the catheter will be inserted into an artery (local anesthetic).  To reduce your risk of infection: ? Your health care team will wash or sanitize their hands. ? Your skin will be washed with soap. ? Hair may be removed from the area where the catheter will be inserted.  You will be connected to a continuous ECG monitor.  The catheter will be inserted into an artery. The location may be in your groin, in your wrist, or in the fold of your arm (near your elbow).  A type of X-ray (fluoroscopy) will be used to help guide the catheter to the opening of the blood vessel that is being examined.  A dye will be injected into the catheter, and X-rays will be taken. The dye will help to show where any narrowing or blockages are located in the heart arteries.  Tell your health care provider if you have any chest pain or trouble breathing during the procedure.  If blockages are found, your health care provider may perform another procedure, such as inserting a coronary stent. The procedure may vary among health care providers and hospitals. What happens after the procedure?  After the procedure, you will need to keep the area still for a few hours, or for as long as told by your health care provider. If the procedure is done through the groin, you will be instructed to not bend and not cross your legs.  The insertion site will be checked frequently.  The pulse in your foot or  wrist will be checked frequently.  You may have additional blood tests, X-rays, and a test that records the electrical activity of your heart (ECG).  Do not drive for 24 hours if you were given a sedative. Summary  A coronary angiogram is an X-ray procedure that is used to look into the arteries in the heart.  During the procedure, a dye (contrast dye) is injected through a long, thin tube (catheter). The catheter is inserted through the groin, wrist, or arm.  Tell your health care provider about any allergies you have, including allergies to  contrast dye.  After the procedure, you will need to keep the area still for a few hours, or for as long as told by your health care provider. This information is not intended to replace advice given to you by your health care provider. Make sure you discuss any questions you have with your health care provider. Document Released: 03/29/2003 Document Revised: 07/04/2016 Document Reviewed: 07/04/2016 Elsevier Interactive Patient Education  [employer].

## 2017-11-16 ENCOUNTER — Telehealth (HOSPITAL_COMMUNITY): Payer: Self-pay

## 2017-11-16 NOTE — Discharge Summary (Signed)
Bryan Carter, Bryan Carter                ACCOUNT NO.:  [id number]  MEDICAL RECORD NO.:  [id number]  LOCATION:                                 FACILITY:  PHYSICIAN:  Raschelle Wisenbaker N. Sharyn Lull, M.D.      DATE OF BIRTH:  DATE OF ADMISSION:  11/12/2017 DATE OF DISCHARGE:  11/13/2017                              DISCHARGE SUMMARY   ADMITTING DIAGNOSES: 1. Exertional new onset dyspnea, probably angina equivalent, abnormal     nuclear stress test, rule out progression of coronary artery     disease. 2. Coronary artery disease, history of anteroseptal wall myocardial     infarction in February of 2001, status post percutaneous coronary     intervention to left anterior descending in 2001, status post     percutaneous coronary intervention to left circumflex and obtuse     marginal in January of 2018. 3. Ischemic cardiomyopathy. 4. Hypertension. 5. Hyperlipidemia. 6. Hypothyroidism. 7. Morbid obesity. 8. Chronic kidney disease.  DISCHARGE DIAGNOSES: 1. New onset angina, status post left cardiac     catheterization/percutaneous transluminal coronary angioplasty to     in-stent restenoses of the left circumflex and obtuse marginal. 2. Multivessel coronary artery disease, history of anteroseptal wall     myocardial infarction in February of 2001, status post percutaneous     coronary intervention to left anterior descending in 2001, status     post percutaneous transluminal coronary angioplasty stenting to     left circumflex and obtuse marginal in January of 2018. 3. Ischemic cardiomyopathy. 4. Hypertension. 5. Hyperlipidemia. 6. Hypothyroidism. 7. Morbid obesity. 8. Chronic kidney disease, which is stable.  DISCHARGE HOME MEDICATIONS:  Nitrostat 0.4 mg sublingual p.r.n. and continue rest of the home medications, i.e., 1. Aspirin 81 mg 1 tablet daily. 2. Atorvastatin 80 mg 1 tablet daily. 3. Carvedilol 12.5 mg twice daily. 4. Proscar 5 mg daily. 5. Fish oil 1 capsule daily. 6. Levothyroxine  75 mcg daily. 7. Losartan 100 mg daily. 8. Multivitamin 1 tablet daily. 9. Aldactone 25 mg daily. 10.Flomax 0.4 mg daily. 11.Brilinta 90 mg twice daily. 12.Amlodipine dose has been reduced to 5 mg daily.  DIET:  Low-salt, low-cholesterol, weight-reducing diet.  FOLLOWUP:  Follow up with me in 1 week, post cardiac cath/PTCA instructions have been given.  The patient will be scheduled for phase 2 cardiac rehab as outpatient.  CONDITION AT DISCHARGE:  Stable.  BRIEF HISTORY AND HOSPITAL COURSE:  Bryan Carter is a 77 year old male with past medical history significant for coronary artery disease, history of anteroseptal wall myocardial infarction in 2001, status post PTCA stenting to LAD, subsequently had PTCA stenting to left circumflex/obtuse marginal in January of 2018, hypertension, hyperlipidemia, hypothyroidism, morbid obesity, abdominal aortic aneurysm, remote tobacco abuse, strong family history of coronary artery disease.  He came to the office complaining of exertional dyspnea with minimal exertion, feeling tired, fatigued, no energy lately.  The patient states he felt much better after PCI to left circumflex and obtuse marginal, but lately feels short of breath, tired with minimal exertion.  The patient denies any chest pain.  Denies palpitations, lightheadedness, or syncope.  The patient underwent nuclear stress test, which showed inferior  wall scarring with small area of peri-infarct ischemia with EF of 39%, which has been depressed from prior stress test.  Noninvasive risk stratification was intermediate.  PHYSICAL EXAMINATION:  GENERAL:  He was alert, awake, oriented x3. VITAL SIGNS:  His blood pressure was 147/78, pulse 67 and regular. HEENT:  Conjunctivae were pink. NECK:  Supple.  No JVD.  No bruit. LUNGS:  Clear to auscultation without rhonchi or rales. CARDIOVASCULAR:  S1, S2 was normal.  There was 1/6 systolic murmur. ABDOMEN:  Soft.  Bowel sounds are present,  nontender. EXTREMITIES:  There is no clubbing, cyanosis, or edema. NEURO:  Grossly intact.  LABORATORY DATA:  Sodium is 142, potassium 4.0, BUN 16, creatinine 1.38, which has been stable.  Hemoglobin is 11.1, hematocrit 33.5, white count of 5.7.  EKG showed no acute ischemic changes.  BRIEF HOSPITAL COURSE:  The patient was a.m. admit and underwent left cardiac catheterization with selective left and right coronary angiography and PTCA to in-stent restenoses of left circumflex and obtuse marginal.  As per procedure report, the patient tolerated the procedure well.  Postprocedure, the patient did not have any episodes of chest pain.  The patient states he has been ambulating in room and feels better.  His breathing has improved.  His groin is stable with no evidence of hematoma or bruit.  The patient will be discharged home on above medications and will be followed up in my office in 1 week.  The patient has been extensively counseled regarding compliance with medication, diet, and lifestyle changes.  The patient also will be scheduled for phase 2 cardiac rehab as outpatient.     Eduardo Osier. Sharyn Lull, M.D.     MNH/MEDQ  D:  11/13/2017  T:  11/14/2017  Job:  [id number]  cc:   Eduardo Osier. Sharyn Lull, M.D.

## 2017-11-16 NOTE — H&P (Signed)
Printed H&P, was in the chart needs to be scanned

## 2017-11-16 NOTE — Telephone Encounter (Signed)
Patients insurance is active and benefits verified through [facility] - $10.00 co-pay, no deductible, out of pocket amount of $3,400/$70.00 has been met, no co-insurance, and no pre-authorization is required. Passport/reference [phone]  Patient will be contacted and scheduled after review by the RN Navigator.

## 2017-11-19 ENCOUNTER — Telehealth (HOSPITAL_COMMUNITY): Payer: Self-pay

## 2017-11-19 NOTE — Telephone Encounter (Signed)
Called to speak with patient in regards to Cardiac Rehab - Wife of patient answered and stated patient does not want to Cardiac Rehab as he has done it already. Closed referral.

## 2018-08-26 ENCOUNTER — Ambulatory Visit (INDEPENDENT_AMBULATORY_CARE_PROVIDER_SITE_OTHER)
Admission: RE | Admit: 2018-08-26 | Discharge: 2018-08-26 | Disposition: A | Discharge status: Home / Self Care | Payer: Medicare HMO | Source: Ambulatory Visit | Attending: Family | Admitting: Family

## 2018-08-26 ENCOUNTER — Other Ambulatory Visit: Payer: Self-pay

## 2018-08-26 ENCOUNTER — Ambulatory Visit (INDEPENDENT_AMBULATORY_CARE_PROVIDER_SITE_OTHER): Payer: Medicare HMO | Admitting: Family

## 2018-08-26 ENCOUNTER — Ambulatory Visit (HOSPITAL_COMMUNITY)
Admission: RE | Admit: 2018-08-26 | Discharge: 2018-08-26 | Disposition: A | Discharge status: Home / Self Care | Payer: Medicare HMO | Source: Ambulatory Visit | Attending: Family | Admitting: Family

## 2018-08-26 ENCOUNTER — Encounter: Payer: Self-pay | Admitting: Family

## 2018-08-26 VITALS — BP 137/74 | HR 66 | Temp 97.2°F | Resp 24 | Ht 66.0 in | Wt 234.0 lb

## 2018-08-26 DIAGNOSIS — I714 Abdominal aortic aneurysm, without rupture, unspecified: Secondary | ICD-10-CM

## 2018-08-26 DIAGNOSIS — R609 Edema, unspecified: Secondary | ICD-10-CM | POA: Diagnosis not present

## 2018-08-26 DIAGNOSIS — Z87891 Personal history of nicotine dependence: Secondary | ICD-10-CM | POA: Diagnosis present

## 2018-08-26 DIAGNOSIS — I779 Disorder of arteries and arterioles, unspecified: Secondary | ICD-10-CM | POA: Insufficient documentation

## 2018-08-26 NOTE — Progress Notes (Signed)
[facility] [place] HISTORY AND PHYSICAL   CC: Follow up AAA and peripheral artery occlusive disease    History of Present Illness:   Bryan Carter is a 77 y.o. male whom Dr. Myra Gianotti has been monitoringfor abdominal aortic aneurysm. Previously it measured 4.1 cm by ultrasound.  The patient has a history of prostate cancer. He has a history of undergoing a colonoscopy complicated by perforation requiring colon resection. This was done through a midline incision. He also developed a hernia and has had a incisional hernia performed with mesh.  The patient has a history of coronary artery disease. He has had a heart attack and has undergone coronary stenting 2. He is medically managed for hypercholesterolemia with a statin. He quit smoking about 2008when he had his heart attack. He takes a daily 81 mg ASA.   Pt states he had an echocardiogram recently re dyspnea and swelling in his right leg, states requested by his PCP.  Pt states his blood pressure at home is 130's/74  He rides a stationary bike for an hour 3-4 days/week for 3 hours/day an does upper body exercises also. He has a history of a right ankle injury in 2016. After walking about a block his left calf hurts, relieved by rest, but does not hurt when walking on the treadmill.    He deniesback or abdominal pain.  Hedenies anyhistory of stroke or TIA symptoms.  He takes Brilinta since his cardiac stents were placed in February 2019, Dr. Sharyn Lull.   Diabetic:No Tobacco GEX:BMWUXL smoker, quitin 2008   Current Outpatient Medications  Medication Sig Dispense Refill  . amLODipine (NORVASC) 10 MG tablet Take 0.5 tablets (5 mg total) by mouth daily. 30 tablet 3  . aspirin 81 MG tablet Take 81 mg by mouth daily.    Marland Kitchen atorvastatin (LIPITOR) 80 MG tablet Take 1 tablet (80 mg total) by mouth at bedtime. 30 tablet 3  . carvedilol (COREG) 12.5 MG tablet Take 12.5 mg by mouth 2 (two) times  daily with a meal.    . finasteride (PROSCAR) 5 MG tablet Take 5 mg by mouth daily.    Marland Kitchen levothyroxine (SYNTHROID, LEVOTHROID) 75 MCG tablet Take 75 mcg by mouth daily before breakfast.    . losartan (COZAAR) 100 MG tablet Take 100 mg by mouth daily.    . Multiple Vitamin (MULTIVITAMIN) tablet Take 1 tablet daily by mouth.    . nitroGLYCERIN (NITROSTAT) 0.4 MG SL tablet Place 1 tablet (0.4 mg total) under the tongue every 5 (five) minutes as needed for chest pain. 100 tablet 3  . Omega-3 Fatty Acids (FISH OIL PO) Take 1 capsule by mouth daily.     Marland Kitchen spironolactone (ALDACTONE) 25 MG tablet Take 25 mg by mouth daily.    . tamsulosin (FLOMAX) 0.4 MG CAPS capsule Take 0.4 mg by mouth daily.    . ticagrelor (BRILINTA) 90 MG TABS tablet Take 1 tablet (90 mg total) by mouth 2 (two) times daily. 60 tablet 3   No current facility-administered medications for this visit.     Past Medical History:  Diagnosis Date  . AAA (abdominal aortic aneurysm) (HCC)   . Arthritis    "joints, back" (11/12/2017)  . Coronary artery disease   . Hyperlipidemia   . Hypertension   . Hypothyroidism   . Myocardial infarction [facility]) 2008    Social History Social History   Tobacco Use  . Smoking status: Former Smoker    Packs/day: 1.00  Years: 50.00    Pack years: 50.00    Types: Cigarettes    Last attempt to quit: 10/06/2006    Years since quitting: 11.8  . Smokeless tobacco: Never Used  Substance Use Topics  . Alcohol use: Yes    Alcohol/week: 0.0 standard drinks    Comment: 11/12/2017  "might drink a beer once/year"  . Drug use: Yes    Types: GHB    Comment: 10/16/2016 & 11/12/2017 pt denies hx of drug use    Family History Family History  Problem Relation Age of Onset  . Heart attack Mother   . Heart disease Mother   . Heart attack Father   . Heart disease Father     Surgical History Past Surgical History:  Procedure Laterality Date  . ABDOMINAL EXPLORATION SURGERY  ~ 2006   "had to open me up  and put mesh in; 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; caused a hernia"  . CARDIAC CATHETERIZATION N/A 10/16/2016   Procedure: Left Heart Cath and Coronary Angiography;  Surgeon: Rinaldo Cloud, MD;  Location[facility] INVASIVE CV LAB;  Service: Cardiovascular;  Laterality: N/A;  . CARDIAC CATHETERIZATION N/A 10/16/2016   Procedure: Coronary Stent Intervention;  Surgeon: Rinaldo Cloud, MD;  Location: [facility] INVASIVE CV LAB;  Service: Cardiovascular;  Laterality: N/A;  . CATARACT EXTRACTION W/ INTRAOCULAR LENS  IMPLANT, BILATERAL Bilateral   . COLON SURGERY  2006   S/P colonoscopy punctures  . COLONOSCOPY  2006   "punctured my colon; ended up in hospital"  . CORONARY ANGIOGRAPHY N/A 11/12/2017   Procedure: CORONARY ANGIOGRAPHY;  Surgeon: Rinaldo Cloud, MD;  Location: [facility] INVASIVE CV LAB;  Service: Cardiovascular;  Laterality: N/A;  . CORONARY ANGIOPLASTY    . CORONARY ANGIOPLASTY WITH STENT PLACEMENT  2008  . CORONARY BALLOON ANGIOPLASTY N/A 11/12/2017   Procedure: CORONARY BALLOON ANGIOPLASTY;  Surgeon: Rinaldo Cloud, MD;  Location: [facility] INVASIVE CV LAB;  Service: Cardiovascular;  Laterality: N/A;  . HERNIA REPAIR  ~ 2006    Allergies  Allergen Reactions  . Quinapril Cough    Current Outpatient Medications  Medication Sig Dispense Refill  . amLODipine (NORVASC) 10 MG tablet Take 0.5 tablets (5 mg total) by mouth daily. 30 tablet 3  . aspirin 81 MG tablet Take 81 mg by mouth daily.    Marland Kitchen atorvastatin (LIPITOR) 80 MG tablet Take 1 tablet (80 mg total) by mouth at bedtime. 30 tablet 3  . carvedilol (COREG) 12.5 MG tablet Take 12.5 mg by mouth 2 (two) times daily with a meal.    . finasteride (PROSCAR) 5 MG tablet Take 5 mg by mouth daily.    Marland Kitchen levothyroxine (SYNTHROID, LEVOTHROID) 75 MCG tablet Take 75 mcg by mouth daily before breakfast.    . losartan (COZAAR) 100 MG tablet Take 100 mg by mouth daily.    . Multiple Vitamin (MULTIVITAMIN) tablet Take 1 tablet daily by mouth.    . nitroGLYCERIN (NITROSTAT) 0.4 MG SL  tablet Place 1 tablet (0.4 mg total) under the tongue every 5 (five) minutes as needed for chest pain. 100 tablet 3  . Omega-3 Fatty Acids (FISH OIL PO) Take 1 capsule by mouth daily.     Marland Kitchen spironolactone (ALDACTONE) 25 MG tablet Take 25 mg by mouth daily.    . tamsulosin (FLOMAX) 0.4 MG CAPS capsule Take 0.4 mg by mouth daily.    . ticagrelor (BRILINTA) 90 MG TABS tablet Take 1 tablet (90 mg total) by mouth 2 (two) times daily. 60 tablet 3   No  current facility-administered medications for this visit.      REVIEW OF SYSTEMS: See HPI for pertinent positives and negatives.  Physical Examination Vitals:   08/26/18 0917 08/26/18 0922  BP: 138/80 137/74  Pulse: 66   Resp: (!) 24   Temp: (!) 97.2 F (36.2 C)   TempSrc: Oral   SpO2: 96%   Weight: 234 lb (106.1 kg)   Height: 5\' 6"  (1.676 m)    Body mass index is 37.77 kg/m.  General:  WDWN obese male in NAD Gait: Normal HENT: WNL Eyes: Pupils equal Pulmonary: normal non-labored breathing, good air movement in all fields, CTAB, no rales, rhonchi, or wheezes.  Cardiac: RRR, no murmur detected Abdomen: soft, large panus, NT, no masses palpated Skin: no rashes, no ulcers, no cellulitis.   VASCULAR EXAM  Carotid Bruits Right Left   Negative Negative      Radial pulses are 2+ palpable bilaterally   Adominal aortic pulse is not palpable                      VASCULAR EXAM: Extremities without ischemic changes, without Gangrene; without open wounds. 1+ bilateral pretibial pitting edema.                                                                                                            LE Pulses Right Left       FEMORAL  2+ palpable  2+ palpable        POPLITEAL  not palpable   not palpable       POSTERIOR TIBIAL  not palpable   not palpable        DORSALIS PEDIS      ANTERIOR TIBIAL not palpable  not palpable     Musculoskeletal: no muscle wasting or atrophy; no peripheral edema  Neurologic:  A&O X 3;  appropriate affect, sensation is normal; speech is normal, CN 2-12 intact, pain and light touch intact in extremities, motor exam as listed above. Psychiatric: Normal thought content, mood appropriate to clinical situation.    ASSESSMENT:  REVIN CORKER is a 77 y.o. male who presents with asymptomatic AAA with 4 mm increase in size, based on limited visualization: 4.40 cm today. Pt has moderate, non life limiting claudication in his left calf. He exercises most days of the week in a gym for 3 hours, including on a treadmill and stationary bike.   In Dr. Estanislado Spire 08/13/15 assessment:At the time of repair ifpt isan endovascular candidate, I will need to evaluate the possible stenosis in his right femoral artery.  His pedal pulses are not palpable, but his feet are warm, all toes have brisk capillary refill, no signs of ischemia in his feet/legs. Bilateral femoral pulses are 2+ palpable.  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     DATA  AAA Duplex:  Previous size: 4.05cm (Date: 08-24-17), based on limited visualization due to overlying bowel gas and pt body habitus. Bilateral iliac arteries not visualized.  Current size:  4.40 cm (Date: 08-26-18), Right CIA: 1.6 cm, Left CIA: 1.3 cm (limited visualization).    ABI (Date: 08/26/2018):  R:   ABI: 0.78 (was 0.67 on 08-24-17),   PT: mono  DP: mono  TBI:  0.43, toe pressure 66, (was 0.53)  L:   ABI: 0.70 (was 0.65),   PT: mono  DP: mono  TBI: 0.49, toe pressure 75, (was 0.47) Improved bilateral ABI with moderate disease bilaterally, all monophasic waveforms.    CTA ABD/PELVIS 08-13-15: 1. Fusiform infrarenal abdominal aortic aneurysm measuring 3.9 x 4.1 cm in greatest respective width and AP dimensions. Infrarenal aneurysm neck measures 2 x 2.5 cm. 2. 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 3. Approximately 50% narrowing in the proximal  trunk of the superior mesenteric artery. 4. Approximately 50% stenosis of the right renal artery. 5. 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 The origin of the right SFA is chronically narrowed with the visualized proximal SFA nearly occluded. The profunda femoral artery appears normally patent on the right. 6. 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 Diffuse disease of the visualized left SFA including origin stenosis of at least 70%. 7. Significant degenerative disc disease in the lumbar spine.   PLAN:   Based on this patient's exam and diagnostic studies, the patient will follow up in 9 months with the following studies: AAA duplex and ABI's.   Continue extensive exercising.   I discussed in depth with the patient the nature of atherosclerosis, and emphasized the importance of maximal medical management including strict control of blood pressure, blood glucose, and lipid levels, obtaining regular exercise, and cessation of smoking.  The patient is aware that without maximal medical management the underlying atherosclerotic disease process will progress, limiting the benefit of any interventions.  Consideration for repair of AAA would be made when the size approaches 5.0 cm, growth > 1 cm/yr, and symptomatic status. The patient was given information about AAA including signs, symptoms, treatment,  what symptoms should prompt the patient to seek immediate medical care, and how to minimize the risk of enlargement and rupture of aneurysms.   The patient was given information about PAD including signs, symptoms, treatment, what symptoms should prompt the patient to seek immediate medical care, and risk reduction measures to take.  Thank you for allowing [place] to participate in this patient's care.  Charisse March, RN, MSN, FNP-C [facility] Office: [phone]  Clinic MD: [facility] 08/26/2018 9:53 AM

## 2018-08-26 NOTE — Patient Instructions (Signed)
Abdominal Aortic Aneurysm Blood pumps away from the heart through tubes (blood vessels) called arteries. Aneurysms are weak or damaged places in the wall of an artery. It bulges out like a balloon. An abdominal aortic aneurysm happens in the main artery of the body (aorta). It can burst or tear, causing bleeding inside the body. This is an emergency. It needs treatment right away. What are the causes? The exact cause is unknown. Things that could cause this problem include:  Fat and other substances building up in the lining of a tube.  Swelling of the walls of a blood vessel.  Certain tissue diseases.  Belly (abdominal) trauma.  An infection in the main artery of the body.  What increases the risk? There are things that make it more likely for you to have an aneurysm. These include:  Being over the age of 78 years old.  Having high blood pressure (hypertension).  Being a male.  Being white.  Being very overweight (obese).  Having a family history of aneurysm.  Using tobacco products.  What are the signs or symptoms? Symptoms depend on the size of the aneurysm and how fast it grows. There may not be symptoms. If symptoms occur, they can include:  Pain (belly, side, lower back, or groin).  Feeling full after eating a small amount of food.  Feeling sick to your stomach (nauseous), throwing up (vomiting), or both.  Feeling a lump in your belly that feels like it is beating (pulsating).  Feeling like you will pass out (faint).  How is this treated?  Medicine to control blood pressure and pain.  Imaging tests to see if the aneurysm gets bigger.  Surgery. How is this prevented? To lessen your chance of getting this condition:  Stop smoking. Stop chewing tobacco.  Limit or avoid alcohol.  Keep your blood pressure, blood sugar, and cholesterol within normal limits.  Eat less salt.  Eat foods low in saturated fats and cholesterol. These are found in animal and  whole dairy products.  Eat more fiber. Fiber is found in whole grains, vegetables, and fruits.  Keep a healthy weight.  Stay active and exercise often.  This information is not intended to replace advice given to you by your health care provider. Make sure you discuss any questions you have with your health care provider. Document Released: 01/17/2013 Document Revised: 02/28/2016 Document Reviewed: 10/22/2012 Elsevier Interactive Patient Education  2017 Elsevier Inc.     Peripheral Vascular Disease Peripheral vascular disease (PVD) is a disease of the blood vessels that are not part of your heart and brain. A simple term for PVD is poor circulation. In most cases, PVD narrows the blood vessels that carry blood from your heart to the rest of your body. This can result in a decreased supply of blood to your arms, legs, and internal organs, like your stomach or kidneys. However, it most often affects a person's lower legs and feet. There are two types of PVD.  Organic PVD. This is the more common type. It is caused by damage to the structure of blood vessels.  Functional PVD. This is caused by conditions that make blood vessels contract and tighten (spasm).  Without treatment, PVD tends to get worse over time. PVD can also lead to acute ischemic limb. This is when an arm or limb suddenly has trouble getting enough blood. This is a medical emergency. Follow these instructions at home:  Take medicines only as told by your doctor.  Do not use   any tobacco products, including cigarettes, chewing tobacco, or electronic cigarettes. If you need help quitting, ask your doctor.  Lose weight if you are overweight, and maintain a healthy weight as told by your doctor.  Eat a diet that is low in fat and cholesterol. If you need help, ask your doctor.  Exercise regularly. Ask your doctor for some good activities for you.  Take good care of your feet. ? Wear comfortable shoes that fit  well. ? Check your feet often for any cuts or sores. Contact a doctor if:  You have cramps in your legs while walking.  You have leg pain when you are at rest.  You have coldness in a leg or foot.  Your skin changes.  You are unable to get or have an erection (erectile dysfunction).  You have cuts or sores on your feet that are not healing. Get help right away if:  Your arm or leg turns cold and blue.  Your arms or legs become red, warm, swollen, painful, or numb.  You have chest pain or trouble breathing.  You suddenly have weakness in your face, arm, or leg.  You become very confused or you cannot speak.  You suddenly have a very bad headache.  You suddenly cannot see. This information is not intended to replace advice given to you by your health care provider. Make sure you discuss any questions you have with your health care provider. Document Released: 12/17/2009 Document Revised: 02/28/2016 Document Reviewed: 03/02/2014 Elsevier Interactive Patient Education  2017 Elsevier Inc.  

## 2019-08-31 ENCOUNTER — Inpatient Hospital Stay (HOSPITAL_COMMUNITY)
Admission: EM | Admit: 2019-08-31 | Discharge: 2019-09-03 | DRG: 871 | Disposition: A | Discharge status: Home / Self Care | Payer: Medicare Other | Attending: Internal Medicine | Admitting: Internal Medicine

## 2019-08-31 ENCOUNTER — Encounter (HOSPITAL_COMMUNITY): Payer: Self-pay

## 2019-08-31 ENCOUNTER — Emergency Department (HOSPITAL_COMMUNITY): Payer: Medicare Other

## 2019-08-31 ENCOUNTER — Inpatient Hospital Stay (HOSPITAL_COMMUNITY): Payer: Medicare Other

## 2019-08-31 ENCOUNTER — Other Ambulatory Visit: Payer: Self-pay

## 2019-08-31 DIAGNOSIS — M6282 Rhabdomyolysis: Secondary | ICD-10-CM | POA: Diagnosis present

## 2019-08-31 DIAGNOSIS — I10 Essential (primary) hypertension: Secondary | ICD-10-CM | POA: Diagnosis present

## 2019-08-31 DIAGNOSIS — N390 Urinary tract infection, site not specified: Secondary | ICD-10-CM | POA: Diagnosis present

## 2019-08-31 DIAGNOSIS — Z955 Presence of coronary angioplasty implant and graft: Secondary | ICD-10-CM

## 2019-08-31 DIAGNOSIS — R339 Retention of urine, unspecified: Secondary | ICD-10-CM | POA: Diagnosis present

## 2019-08-31 DIAGNOSIS — Z7982 Long term (current) use of aspirin: Secondary | ICD-10-CM | POA: Diagnosis not present

## 2019-08-31 DIAGNOSIS — Z888 Allergy status to other drugs, medicaments and biological substances status: Secondary | ICD-10-CM | POA: Diagnosis not present

## 2019-08-31 DIAGNOSIS — D72829 Elevated white blood cell count, unspecified: Secondary | ICD-10-CM

## 2019-08-31 DIAGNOSIS — E669 Obesity, unspecified: Secondary | ICD-10-CM | POA: Diagnosis present

## 2019-08-31 DIAGNOSIS — Z87891 Personal history of nicotine dependence: Secondary | ICD-10-CM

## 2019-08-31 DIAGNOSIS — R0602 Shortness of breath: Secondary | ICD-10-CM | POA: Diagnosis not present

## 2019-08-31 DIAGNOSIS — N170 Acute kidney failure with tubular necrosis: Secondary | ICD-10-CM | POA: Diagnosis present

## 2019-08-31 DIAGNOSIS — E039 Hypothyroidism, unspecified: Secondary | ICD-10-CM | POA: Diagnosis present

## 2019-08-31 DIAGNOSIS — Z79890 Hormone replacement therapy (postmenopausal): Secondary | ICD-10-CM | POA: Diagnosis not present

## 2019-08-31 DIAGNOSIS — Z20828 Contact with and (suspected) exposure to other viral communicable diseases: Secondary | ICD-10-CM | POA: Diagnosis present

## 2019-08-31 DIAGNOSIS — I252 Old myocardial infarction: Secondary | ICD-10-CM | POA: Diagnosis not present

## 2019-08-31 DIAGNOSIS — R34 Anuria and oliguria: Secondary | ICD-10-CM | POA: Diagnosis present

## 2019-08-31 DIAGNOSIS — R6520 Severe sepsis without septic shock: Secondary | ICD-10-CM

## 2019-08-31 DIAGNOSIS — N179 Acute kidney failure, unspecified: Secondary | ICD-10-CM | POA: Diagnosis present

## 2019-08-31 DIAGNOSIS — A419 Sepsis, unspecified organism: Secondary | ICD-10-CM | POA: Diagnosis present

## 2019-08-31 DIAGNOSIS — M159 Polyosteoarthritis, unspecified: Secondary | ICD-10-CM | POA: Diagnosis present

## 2019-08-31 DIAGNOSIS — R338 Other retention of urine: Secondary | ICD-10-CM | POA: Diagnosis not present

## 2019-08-31 DIAGNOSIS — I2510 Atherosclerotic heart disease of native coronary artery without angina pectoris: Secondary | ICD-10-CM | POA: Diagnosis present

## 2019-08-31 DIAGNOSIS — E860 Dehydration: Secondary | ICD-10-CM | POA: Diagnosis present

## 2019-08-31 DIAGNOSIS — Z8249 Family history of ischemic heart disease and other diseases of the circulatory system: Secondary | ICD-10-CM | POA: Diagnosis not present

## 2019-08-31 DIAGNOSIS — I714 Abdominal aortic aneurysm, without rupture, unspecified: Secondary | ICD-10-CM | POA: Diagnosis present

## 2019-08-31 DIAGNOSIS — E785 Hyperlipidemia, unspecified: Secondary | ICD-10-CM | POA: Diagnosis present

## 2019-08-31 DIAGNOSIS — R197 Diarrhea, unspecified: Secondary | ICD-10-CM | POA: Diagnosis not present

## 2019-08-31 DIAGNOSIS — R6521 Severe sepsis with septic shock: Secondary | ICD-10-CM | POA: Diagnosis present

## 2019-08-31 DIAGNOSIS — R7989 Other specified abnormal findings of blood chemistry: Secondary | ICD-10-CM | POA: Diagnosis not present

## 2019-08-31 LAB — CBC WITH DIFFERENTIAL/PLATELET
Abs Immature Granulocytes: 0.16 10*3/uL — ABNORMAL HIGH (ref 0.00–0.07)
Basophils Absolute: 0 10*3/uL (ref 0.0–0.1)
Basophils Relative: 0 %
Eosinophils Absolute: 0 10*3/uL (ref 0.0–0.5)
Eosinophils Relative: 0 %
HCT: 34.1 % — ABNORMAL LOW (ref 39.0–52.0)
Hemoglobin: 11 g/dL — ABNORMAL LOW (ref 13.0–17.0)
Immature Granulocytes: 1 %
Lymphocytes Relative: 6 %
Lymphs Abs: 1.1 10*3/uL (ref 0.7–4.0)
MCH: 30.6 pg (ref 26.0–34.0)
MCHC: 32.3 g/dL (ref 30.0–36.0)
MCV: 95 fL (ref 80.0–100.0)
Monocytes Absolute: 1.7 10*3/uL — ABNORMAL HIGH (ref 0.1–1.0)
Monocytes Relative: 10 %
Neutro Abs: 14.3 10*3/uL — ABNORMAL HIGH (ref 1.7–7.7)
Neutrophils Relative %: 83 %
Platelets: 192 10*3/uL (ref 150–400)
RBC: 3.59 MIL/uL — ABNORMAL LOW (ref 4.22–5.81)
RDW: 15.4 % (ref 11.5–15.5)
WBC: 17.3 10*3/uL — ABNORMAL HIGH (ref 4.0–10.5)
nRBC: 0 % (ref 0.0–0.2)

## 2019-08-31 LAB — LACTIC ACID, PLASMA
Lactic Acid, Venous: 1.5 mmol/L (ref 0.5–1.9)
Lactic Acid, Venous: 2.1 mmol/L (ref 0.5–1.9)

## 2019-08-31 LAB — BRAIN NATRIURETIC PEPTIDE: B Natriuretic Peptide: 64 pg/mL (ref 0.0–100.0)

## 2019-08-31 LAB — BASIC METABOLIC PANEL
Anion gap: 10 (ref 5–15)
BUN: 31 mg/dL — ABNORMAL HIGH (ref 8–23)
CO2: 19 mmol/L — ABNORMAL LOW (ref 22–32)
Calcium: 9.3 mg/dL (ref 8.9–10.3)
Chloride: 108 mmol/L (ref 98–111)
Creatinine, Ser: 1.84 mg/dL — ABNORMAL HIGH (ref 0.61–1.24)
GFR calc Af Amer: 40 mL/min — ABNORMAL LOW (ref >60)
GFR calc non Af Amer: 34 mL/min — ABNORMAL LOW (ref >60)
Glucose, Bld: 116 mg/dL — ABNORMAL HIGH (ref 70–99)
Potassium: 4.1 mmol/L (ref 3.5–5.1)
Sodium: 137 mmol/L (ref 135–145)

## 2019-08-31 LAB — SEDIMENTATION RATE: Sed Rate: 52 mm/hr — ABNORMAL HIGH (ref 0–16)

## 2019-08-31 LAB — TROPONIN I (HIGH SENSITIVITY): Troponin I (High Sensitivity): 8 ng/L (ref <18)

## 2019-08-31 LAB — HEPATIC FUNCTION PANEL
ALT: 20 U/L (ref 0–44)
AST: 28 U/L (ref 15–41)
Albumin: 3.5 g/dL (ref 3.5–5.0)
Alkaline Phosphatase: 88 U/L (ref 38–126)
Bilirubin, Direct: 0.2 mg/dL (ref 0.0–0.2)
Indirect Bilirubin: 0.7 mg/dL (ref 0.3–0.9)
Total Bilirubin: 0.9 mg/dL (ref 0.3–1.2)
Total Protein: 7.4 g/dL (ref 6.5–8.1)

## 2019-08-31 LAB — C-REACTIVE PROTEIN: CRP: 8.8 mg/dL — ABNORMAL HIGH (ref <1.0)

## 2019-08-31 LAB — POC SARS CORONAVIRUS 2 AG -  ED: SARS Coronavirus 2 Ag: NEGATIVE

## 2019-08-31 LAB — PROCALCITONIN: Procalcitonin: 0.26 ng/mL

## 2019-08-31 LAB — CK: Total CK: 740 U/L — ABNORMAL HIGH (ref 49–397)

## 2019-08-31 LAB — FERRITIN: Ferritin: 218 ng/mL (ref 24–336)

## 2019-08-31 LAB — FIBRINOGEN: Fibrinogen: 529 mg/dL — ABNORMAL HIGH (ref 210–475)

## 2019-08-31 LAB — D-DIMER, QUANTITATIVE: D-Dimer, Quant: 8.25 ug/mL-FEU — ABNORMAL HIGH (ref 0.00–0.50)

## 2019-08-31 MED ORDER — ACETAMINOPHEN 650 MG RE SUPP: 650.0000 mg | Freq: Four times a day (QID) | RECTAL | Status: DC | PRN

## 2019-08-31 MED ORDER — ACETAMINOPHEN 325 MG PO TABS
650.0000 mg | ORAL_TABLET | Freq: Four times a day (QID) | ORAL | Status: DC | PRN
  Administered 2019-08-31: 650 mg via ORAL
  Filled 2019-08-31: qty 2

## 2019-08-31 MED ORDER — SODIUM CHLORIDE 0.9 % IV SOLN
1.0000 g | INTRAVENOUS | Status: DC
  Administered 2019-09-01 – 2019-09-02 (×2): 1 g via INTRAVENOUS
  Filled 2019-08-31 (×2): qty 1
  Filled 2019-08-31: qty 10

## 2019-08-31 MED ORDER — IOHEXOL 300 MG/ML  SOLN
30.0000 mL | Freq: Once | INTRAMUSCULAR | Status: AC | PRN
  Administered 2019-08-31: 30 mL via ORAL

## 2019-08-31 MED ORDER — ONDANSETRON HCL 4 MG/2ML IJ SOLN: 4.0000 mg | Freq: Four times a day (QID) | INTRAMUSCULAR | Status: DC | PRN

## 2019-08-31 MED ORDER — ONDANSETRON HCL 4 MG PO TABS: 4.0000 mg | ORAL_TABLET | Freq: Four times a day (QID) | ORAL | Status: DC | PRN

## 2019-08-31 MED ORDER — SODIUM CHLORIDE 0.9 % IV SOLN
1.0000 g | Freq: Once | INTRAVENOUS | Status: AC
  Administered 2019-08-31: 1 g via INTRAVENOUS
  Filled 2019-08-31: qty 10

## 2019-08-31 MED ORDER — SODIUM CHLORIDE 0.9 % IV SOLN
INTRAVENOUS | Status: DC
  Administered 2019-08-31 – 2019-09-03 (×5): via INTRAVENOUS

## 2019-08-31 MED ORDER — HEPARIN SODIUM (PORCINE) 5000 UNIT/ML IJ SOLN
5000.0000 [IU] | Freq: Three times a day (TID) | INTRAMUSCULAR | Status: DC
  Administered 2019-08-31 – 2019-09-03 (×8): 5000 [IU] via SUBCUTANEOUS
  Filled 2019-08-31 (×7): qty 1

## 2019-08-31 MED ORDER — SODIUM CHLORIDE 0.9 % IV BOLUS
500.0000 mL | Freq: Once | INTRAVENOUS | Status: AC
  Administered 2019-08-31: 500 mL via INTRAVENOUS

## 2019-08-31 NOTE — ED Notes (Signed)
Attempted to call 5 E for report, no response.

## 2019-08-31 NOTE — ED Triage Notes (Signed)
Pt states that he has only been able to void "a few drops". Pt states that they tried inserting a catheter in the past but did not get much out.

## 2019-08-31 NOTE — ED Notes (Signed)
ED TO INPATIENT HANDOFF REPORT  ED Nurse Name and Phone #: [place] G, [phone]  S Name/Age/Gender Bryan Carter 78 y.o. male Room/Bed: WA08/WA08  Code Status   Code Status: Prior  Home/SNF/Other Home Patient oriented to: self, place, time and situation Is this baseline? Yes   Triage Complete: Triage complete  Chief Complaint stomach pain diff with urination  Triage Note Pt states that he has only been able to void "a few drops". Pt states that they tried inserting a catheter in the past but did not get much out.    Allergies Allergies  Allergen Reactions  . Quinapril Shortness Of Breath and Cough    Level of Care/Admitting Diagnosis ED Disposition    ED Disposition Condition [facility]: [place] [[id number]]  Level of Care: Med-Surg [16]  Covid Evaluation: Asymptomatic Screening Protocol (No Symptoms)  Diagnosis: Sepsis [facility]) [[id number]]  Admitting Physician: Louellen Molder [phone]  Attending Physician: Louellen Molder [phone]  Estimated length of stay: past midnight tomorrow  Certification:: I certify this patient will need inpatient services for at least 2 midnights  PT Class (Do Not Modify): Inpatient [101]  PT Acc Code (Do Not Modify): Private [1]       B Medical/Surgery History Past Medical History:  Diagnosis Date  . AAA (abdominal aortic aneurysm) ([place])   . Arthritis    "joints, back" (11/12/2017)  . Coronary artery disease   . Hyperlipidemia   . Hypertension   . Hypothyroidism   . Myocardial infarction [facility]) 2008   Past Surgical History:  Procedure Laterality Date  . ABDOMINAL EXPLORATION SURGERY  ~ 2006   "had to open me up and put mesh in; 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; caused a hernia"  . CARDIAC CATHETERIZATION N/A 10/16/2016   Procedure: Left Heart Cath and Coronary Angiography;  Surgeon: Charolette Forward, MD;  Location: [place] CV LAB;  Service: Cardiovascular;  Laterality: N/A;  . CARDIAC CATHETERIZATION  N/A 10/16/2016   Procedure: Coronary Stent Intervention;  Surgeon: Charolette Forward, MD;  Location: [place] CV LAB;  Service: Cardiovascular;  Laterality: N/A;  . CATARACT EXTRACTION W/ INTRAOCULAR LENS  IMPLANT, BILATERAL Bilateral   . COLON SURGERY  2006   S/P colonoscopy punctures  . COLONOSCOPY  2006   "punctured my colon; ended up in hospital"  . CORONARY ANGIOGRAPHY N/A 11/12/2017   Procedure: CORONARY ANGIOGRAPHY;  Surgeon: Charolette Forward, MD;  Location: [place] CV LAB;  Service: Cardiovascular;  Laterality: N/A;  . CORONARY ANGIOPLASTY    . CORONARY ANGIOPLASTY WITH STENT PLACEMENT  2008  . CORONARY BALLOON ANGIOPLASTY N/A 11/12/2017   Procedure: CORONARY BALLOON ANGIOPLASTY;  Surgeon: Charolette Forward, MD;  Location: [place] CV LAB;  Service: Cardiovascular;  Laterality: N/A;  . HERNIA REPAIR  ~ 2006     A IV Location/Drains/Wounds Patient Lines/Drains/Airways Status   Active Line/Drains/Airways    Name:   Placement date:   Placement time:   Site:   Days:   Peripheral IV 08/31/19 Left Antecubital   08/31/19    1544    Antecubital   less than 1          Intake/Output Last 24 hours  Intake/Output Summary (Last 24 hours) at 08/31/2019 1740 Last data filed at 08/31/2019 1737 Gross per 24 hour  Intake 600 ml  Output -  Net 600 ml    Labs/Imaging Results for orders placed or performed during the hospital encounter of 08/31/19 (from the past 48 hour(s))  Hepatic function panel     Status: None   Collection Time: 08/31/19  3:13 PM  Result Value Ref Range   Total Protein 7.4 6.5 - 8.1 g/dL   Albumin 3.5 3.5 - 5.0 g/dL   AST 28 15 - 41 U/L   ALT 20 0 - 44 U/L   Alkaline Phosphatase 88 38 - 126 U/L   Total Bilirubin 0.9 0.3 - 1.2 mg/dL   Bilirubin, Direct 0.2 0.0 - 0.2 mg/dL   Indirect Bilirubin 0.7 0.3 - 0.9 mg/dL    Comment: Performed at [facility], [place] [address], [place], [place] [address]  Brain natriuretic peptide     Status: None    Collection Time: 08/31/19  3:13 PM  Result Value Ref Range   B Natriuretic Peptide 64.0 0.0 - 100.0 pg/mL    Comment: Performed at [facility], [place] [address], [place], [place] [address]  Troponin I (High Sensitivity)     Status: None   Collection Time: 08/31/19  3:13 PM  Result Value Ref Range   Troponin I (High Sensitivity) 8 <18 ng/L    Comment: (NOTE) Elevated high sensitivity troponin I (hsTnI) values and significant  changes across serial measurements may suggest ACS but many other  chronic and acute conditions are known to elevate hsTnI results.  Refer to the "Links" section for chest pain algorithms and additional  guidance. Performed at [facility], [place] [address], [place], [place] [address]   CBC with Differential     Status: Abnormal   Collection Time: 08/31/19  3:15 PM  Result Value Ref Range   WBC 17.3 (H) 4.0 - 10.5 K/uL   RBC 3.59 (L) 4.22 - 5.81 MIL/uL   Hemoglobin 11.0 (L) 13.0 - 17.0 g/dL   HCT 34.1 (L) 39.0 - 52.0 %   MCV 95.0 80.0 - 100.0 fL   MCH 30.6 26.0 - 34.0 pg   MCHC 32.3 30.0 - 36.0 g/dL   RDW 15.4 11.5 - 15.5 %   Platelets 192 150 - 400 K/uL   nRBC 0.0 0.0 - 0.2 %   Neutrophils Relative % 83 %   Neutro Abs 14.3 (H) 1.7 - 7.7 K/uL   Lymphocytes Relative 6 %   Lymphs Abs 1.1 0.7 - 4.0 K/uL   Monocytes Relative 10 %   Monocytes Absolute 1.7 (H) 0.1 - 1.0 K/uL   Eosinophils Relative 0 %   Eosinophils Absolute 0.0 0.0 - 0.5 K/uL   Basophils Relative 0 %   Basophils Absolute 0.0 0.0 - 0.1 K/uL   Immature Granulocytes 1 %   Abs Immature Granulocytes 0.16 (H) 0.00 - 0.07 K/uL    Comment: Performed at [facility], [place] [address], [place], [place] [address]  Basic metabolic panel     Status: Abnormal   Collection Time: 08/31/19  3:15 PM  Result Value Ref Range   Sodium 137 135 - 145 mmol/L   Potassium 4.1 3.5 - 5.1 mmol/L   Chloride 108 98 - 111 mmol/L   CO2 19 (L) 22 - 32 mmol/L    Glucose, Bld 116 (H) 70 - 99 mg/dL   BUN 31 (H) 8 - 23 mg/dL   Creatinine, Ser 1.84 (H) 0.61 - 1.24 mg/dL   Calcium 9.3 8.9 - 10.3 mg/dL   GFR calc non Af Amer 34 (L) >60 mL/min   GFR calc Af Amer 40 (L) >60 mL/min   Anion gap 10 5 - 15  Comment: Performed at [facility], [place] [address] [place], [place] [address]  Lactic acid, plasma     Status: None   Collection Time: 08/31/19  3:15 PM  Result Value Ref Range   Lactic Acid, Venous 1.5 0.5 - 1.9 mmol/L    Comment: Performed at [facility], [place] [address], [place],  [address]  POC SARS Coronavirus 2 Ag-ED - Nasal Swab (BD Veritor Kit)     Status: None   Collection Time: 08/31/19  4:10 PM  Result Value Ref Range   SARS Coronavirus 2 Ag NEGATIVE NEGATIVE    Comment: (NOTE) SARS-CoV-2 antigen NOT DETECTED.  Negative results are presumptive.  Negative results do not preclude SARS-CoV-2 infection and should not be used as the sole basis for treatment or other patient management decisions, including infection  control decisions, particularly in the presence of clinical signs and  symptoms consistent with COVID-19, or in those who have been in contact with the virus.  Negative results must be combined with clinical observations, patient history, and epidemiological information. The expected result is Negative. Fact Sheet for Patients: PodPark.tn Fact Sheet for Healthcare Providers: GiftContent.is This test is not yet approved or cleared by the [place] FDA and  has been authorized for detection and/or diagnosis of SARS-CoV-2 by FDA under an Emergency Use Authorization (EUA).  This EUA will remain in effect (meaning this test can be used) for the duration of  the COVID-19 de claration under Section 564(b)(1) of the Act, 21 U.S.C. section 360bbb-3(b)(1), unless the authorization is terminated or revoked sooner.    Dg Chest  Portable 1 View  Result Date: 08/31/2019 CLINICAL DATA:  Shortness of breath.  Cough. EXAM: PORTABLE CHEST 1 VIEW COMPARISON:  05/29/2009 and CT angiogram of the chest dated 08/13/2015 FINDINGS: Cardiomediastinal silhouette is normal and the lungs are clear. No effusions. No significant bone abnormality. IMPRESSION: Normal exam. Electronically Signed   By: Lorriane Shire M.D.   On: 08/31/2019 15:45    Pending Labs Unresulted Labs (From admission, onward)    Start     Ordered   08/31/19 1727  Sodium, urine, random  ONCE - STAT,   STAT     08/31/19 1726   08/31/19 1727  Creatinine, urine, random  Once,   STAT     08/31/19 1726   08/31/19 1726  C-reactive protein  Once,   STAT     08/31/19 1726   08/31/19 1726  Sedimentation rate  Once,   STAT     08/31/19 1726   08/31/19 1726  Fibrinogen  Once,   STAT     08/31/19 1726   08/31/19 1726  Ferritin  Once,   STAT     08/31/19 1726   08/31/19 1725  Gastrointestinal Panel by PCR , Stool  (Gastrointestinal Panel by PCR, Stool                                                                                                                                                     *  Does Not include CLOSTRIDIUM DIFFICILE testing.**If CDIFF testing is needed, select the C Difficile Quick Screen w PCR reflex order below)  Once,   STAT     08/31/19 1724   08/31/19 1725  D-dimer, quantitative (not at [facility])  ONCE - STAT,   STAT     08/31/19 1726   08/31/19 1613  SARS CORONAVIRUS 2 (TAT 6-24 HRS) Nasopharyngeal Nasopharyngeal Swab  (Asymptomatic/Tier 3)  Once,   STAT    Question Answer Comment  Is this test for diagnosis or screening Screening   Symptomatic for COVID-19 as defined by CDC No   Hospitalized for COVID-19 No   Admitted to ICU for COVID-19 No   Previously tested for COVID-19 Yes   Resident in a congregate (group) care setting No   Employed in healthcare setting No      08/31/19 1612   08/31/19 1521  Blood culture (routine x 2)  BLOOD CULTURE X  2,   STAT     08/31/19 1520   08/31/19 1453  Lactic acid, plasma  Now then every 2 hours,   STAT     08/31/19 1452   08/31/19 1437  Urinalysis, Routine w reflex microscopic  Once,   STAT     08/31/19 1437   Signed and Held  CBC  (heparin)  Once,   R    Comments: Baseline for heparin therapy IF NOT ALREADY DRAWN.  Notify MD if PLT < 100 K.    Signed and Held   Signed and Held  Creatinine, serum  (heparin)  Once,   R    Comments: Baseline for heparin therapy IF NOT ALREADY DRAWN.    Signed and Held   Signed and Held  Comprehensive metabolic panel  Tomorrow morning,   R     Signed and Held   Signed and Held  CBC  Tomorrow morning,   R     Signed and Held          Vitals/Pain Today's Vitals   08/31/19 1600 08/31/19 1630 08/31/19 1700 08/31/19 1738  BP: (!) 148/76 (!) 155/83 (!) 145/89   Pulse: 82 81 90   Resp: 16  15   Temp:    100 F (37.8 C)  TempSrc:    Oral  SpO2: 99% 98% 100%   PainSc:        Isolation Precautions Enteric precautions (UV disinfection)  Medications Medications  cefTRIAXone (ROCEPHIN) 1 g in sodium chloride 0.9 % 100 mL IVPB (0 g Intravenous Stopped 08/31/19 1737)  sodium chloride 0.9 % bolus 500 mL (0 mLs Intravenous Stopped 08/31/19 1737)    Mobility walks Low fall risk

## 2019-08-31 NOTE — H&P (Addendum)
TRH H&P   Patient Demographics:    Bryan BeamRobert Riles, is a 78 y.o. male  MRN: [id number]   DOB - 1941/01/08  Admit Date - 08/31/2019  Outpatient Primary MD for the patient is Fleet ContrasAvbuere, Edwin, MD  Referring MD: Dr. Ferdinand Langoyxtra  Outpatient Specialists: Dr. Mena GoesEskridge (urology)  Patient coming from: Home  Chief Complaint  Patient presents with  . Urinary Retention      HPI:    Bryan Carter  is a 78 y.o. male, with history of NSTEMI, abdominal aortic aneurysm, hypertension, hyperlipidemia, hypothyroidism who was brought to the ED by family with acute onset of urinary retention since last evening.  Patient reports having the urge to urinate but not being able to make any urine.  He reports having?  BPH symptoms with frequent urgency and hesitancy but no urinary retention in the past.  For 1 week he also has been having frequent loose watery stools and since yesterday these have been frequent, multiple episodes without any blood or mucus.  He reports having chills last night and some shortness of breath on exertion.  Denies any chest pain, nausea, vomiting, abdominal pain, dysuria, joint pain.  He reports being increasingly weak and has had poor appetite for past 1 week.  Wife also noticed that his abdominal is more distended today but patient denies any abdominal pain.  He reports that he has been staying at home and has not traveled or been in close contact with anyone with COVID-19 or anyone in the family with COVID-19 infection.  Denies any recent antibiotic use. Daughter reports that at the urology office they did an in and out catheterization and yielded only about 30 cc urine.  He was febrile with temperature of 102.5 F in the office.  In the ED patient had a temperature of 100.5 F, blood pressure 148/76 mmHg, normal O2 sat on room air, heart rate of 82 bpm.  Blood work showed WBC of 17.3K  with predominant neutrophils, hemoglobin of 11, normal platelets.  Point-of-care SARS COVID-19 was tested negative, routine testing was sent.  Chemistry showed AKI with creatinine of 1.84.  LFTs were normal.  High-sensitivity troponin and lactic acid were negative. A bladder scan was done showing no urine.  IV normal saline bolus of 500 cc given along with IV Rocephin 1 g.  Blood cultures, UA and urine culture ordered by the ED physician and hospitalist consulted for admission to telemetry.     Review of systems:    In addition to the HPI above, Fever and chills + + No Headache, No changes with Vision or hearing, No problems swallowing food or Liquids, No Chest pain, Cough or Shortness of Breath++, Diarrhea + +, abdominal distention+, no abdominal pain, nausea or vomiting No Blood in stool or Urine, No dysuria,  No new skin rashes or bruises, No new joints pains-aches,  Generalized weakness + +  tingling, numbness in any extremity, No recent weight gain or loss, No polyuria, polydypsia or polyphagia, No significant Mental Stressors.    With Past History of the following :    Past Medical History:  Diagnosis Date  . AAA (abdominal aortic aneurysm) ([facility])   . Arthritis    "joints, back" (11/12/2017)  . Coronary artery disease   . Hyperlipidemia   . Hypertension   . Hypothyroidism   . Myocardial infarction [facility]) 2008      Past Surgical History:  Procedure Laterality Date  . ABDOMINAL EXPLORATION SURGERY  ~ 2006   "had to open me up and put mesh in; 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; caused a hernia"  . CARDIAC CATHETERIZATION N/A 10/16/2016   Procedure: Left Heart Cath and Coronary Angiography;  Surgeon: Rinaldo Cloud, MD;  Location: [facility] INVASIVE [facility] LAB;  Service: Cardiovascular;  Laterality: N/A;  . CARDIAC CATHETERIZATION N/A 10/16/2016   Procedure: Coronary Stent Intervention;  Surgeon: Rinaldo Cloud, MD;  Location: [facility] INVASIVE CV LAB;  Service: Cardiovascular;  Laterality: N/A;  .  CATARACT EXTRACTION W/ INTRAOCULAR LENS  IMPLANT, BILATERAL Bilateral   . COLON SURGERY  2006   S/P colonoscopy punctures  . COLONOSCOPY  2006   "punctured my colon; ended up in hospital"  . CORONARY ANGIOGRAPHY N/A 11/12/2017   Procedure: CORONARY ANGIOGRAPHY;  Surgeon: Rinaldo Cloud, MD;  Location: [facility] INVASIVE CV LAB;  Service: Cardiovascular;  Laterality: N/A;  . CORONARY ANGIOPLASTY    . CORONARY ANGIOPLASTY WITH STENT PLACEMENT  2008  . CORONARY BALLOON ANGIOPLASTY N/A 11/12/2017   Procedure: CORONARY BALLOON ANGIOPLASTY;  Surgeon: Rinaldo Cloud, MD;  Location: [facility] INVASIVE CV LAB;  Service: Cardiovascular;  Laterality: N/A;  . HERNIA REPAIR  ~ 2006      Social History:     Social History   Tobacco Use  . Smoking status: Former Smoker    Packs/day: 1.00    Years: 50.00    Pack years: 50.00    Types: Cigarettes    Quit date: 10/06/2006    Years since quitting: 12.9  . Smokeless tobacco: Never Used  Substance Use Topics  . Alcohol use: Yes    Alcohol/week: 0.0 standard drinks    Comment: 11/12/2017  "might drink a beer once/year"     Lives -Home with wife  Mobility -independent     Family History :     Family History  Problem Relation Age of Onset  . Heart attack Mother   . Heart disease Mother   . Heart attack Father   . Heart disease Father       Home Medications:   Prior to Admission medications   Medication Sig Start Date End Date Taking? Authorizing Provider  amLODipine (NORVASC) 10 MG tablet Take 0.5 tablets (5 mg total) by mouth daily. Patient not taking: Reported on 08/31/2019 11/13/17   Rinaldo Cloud, MD  amLODipine (NORVASC) 5 MG tablet Take 5 mg by mouth daily. 08/09/19   [provider]  atorvastatin (LIPITOR) 80 MG tablet Take 1 tablet (80 mg total) by mouth at bedtime. 10/17/16   Rinaldo Cloud, MD  carvedilol (COREG) 25 MG tablet Take 25 mg by mouth 2 (two) times daily. 08/05/19   [provider]  finasteride (PROSCAR) 5 MG  tablet Take 5 mg by mouth daily.    [provider]  furosemide (LASIX) 40 MG tablet Take 40 mg by mouth daily. 08/19/19   [provider]  levothyroxine (SYNTHROID, LEVOTHROID) 75 MCG tablet Take 75 mcg  by mouth daily before breakfast.    [provider]  losartan (COZAAR) 100 MG tablet Take 100 mg by mouth daily.    [provider]  Multiple Vitamin (MULTIVITAMIN) tablet Take 1 tablet daily by mouth.    [provider]  nitroGLYCERIN (NITROSTAT) 0.4 MG SL tablet Place 1 tablet (0.4 mg total) under the tongue every 5 (five) minutes as needed for chest pain. 11/13/17 11/13/18  Rinaldo Cloud, MD  Omega-3 Fatty Acids (FISH OIL PO) Take 1 capsule by mouth daily.     [provider]  spironolactone (ALDACTONE) 25 MG tablet Take 25 mg by mouth daily.    [provider]  tamsulosin (FLOMAX) 0.4 MG CAPS capsule Take 0.4 mg by mouth daily.    [provider]  ticagrelor (BRILINTA) 90 MG TABS tablet Take 1 tablet (90 mg total) by mouth 2 (two) times daily. 10/17/16   Rinaldo Cloud, MD     Allergies:     Allergies  Allergen Reactions  . Quinapril Shortness Of Breath and Cough     Physical Exam:   Vitals  Blood pressure (!) 148/76, pulse 82, temperature (!) 100.5 F (38.1 C), temperature source Oral, resp. rate 16, SpO2 99 %.   Elderly obese male lying in bed in no acute distress HEENT: Pupils reactive bilaterally, EOMI, no pallor, moist mucosa, supple neck, no cervical lymphadenopathy Chest: Clear to auscultation bilateral, no added sound CVs: Normal S1-S2, no murmurs rub or gallop GI: Bloated abdomen which is nontender, bowel sounds present, no CVA or suprapubic tenderness Musculoskeletal: Warm, no edema CNs: Alert and oriented, normal sensation bilateral lower extremities, normal power tone and reflexes in bilateral lower extremities, plantar going downward bilaterally  Data Review:    CBC Recent Labs  Lab  08/31/19 1515  WBC 17.3*  HGB 11.0*  HCT 34.1*  PLT 192  MCV 95.0  MCH 30.6  MCHC 32.3  RDW 15.4  LYMPHSABS 1.1  MONOABS 1.7*  EOSABS 0.0  BASOSABS 0.0   ------------------------------------------------------------------------------------------------------------------  Chemistries  Recent Labs  Lab 08/31/19 1513 08/31/19 1515  NA  --  137  K  --  4.1  CL  --  108  CO2  --  19*  GLUCOSE  --  116*  BUN  --  31*  CREATININE  --  1.84*  CALCIUM  --  9.3  AST 28  --   ALT 20  --   ALKPHOS 88  --   BILITOT 0.9  --    ------------------------------------------------------------------------------------------------------------------ CrCl cannot be calculated (Unknown ideal weight.). ------------------------------------------------------------------------------------------------------------------ No results for input(s): TSH, T4TOTAL, T3FREE, THYROIDAB in the last 72 hours.  Invalid input(s): FREET3  Coagulation profile No results for input(s): INR, PROTIME in the last 168 hours. ------------------------------------------------------------------------------------------------------------------- No results for input(s): DDIMER in the last 72 hours. -------------------------------------------------------------------------------------------------------------------  Cardiac Enzymes No results for input(s): CKMB, TROPONINI, MYOGLOBIN in the last 168 hours.  Invalid input(s): CK ------------------------------------------------------------------------------------------------------------------    Component Value Date/Time   BNP 64.0 08/31/2019 1513     ---------------------------------------------------------------------------------------------------------------  Urinalysis    Component Value Date/Time   COLORURINE YELLOW 11/06/2007 2045   APPEARANCEUR CLEAR 11/06/2007 2045   LABSPEC 1.019 11/06/2007 2045   PHURINE 7.0 11/06/2007 2045   GLUCOSEU NEGATIVE 11/06/2007  2045   HGBUR MODERATE (A) 11/06/2007 2045   BILIRUBINUR NEGATIVE 11/06/2007 2045   KETONESUR NEGATIVE 11/06/2007 2045   PROTEINUR 100 (A) 11/06/2007 2045   UROBILINOGEN 0.2 11/06/2007 2045   NITRITE NEGATIVE 11/06/2007 2045   LEUKOCYTESUR  NEGATIVE 11/06/2007 2045    ----------------------------------------------------------------------------------------------------------------   Imaging Results:    Dg Chest Portable 1 View  Result Date: 08/31/2019 CLINICAL DATA:  Shortness of breath.  Cough. EXAM: PORTABLE CHEST 1 VIEW COMPARISON:  05/29/2009 and CT angiogram of the chest dated 08/13/2015 FINDINGS: Cardiomediastinal silhouette is normal and the lungs are clear. No effusions. No significant bone abnormality. IMPRESSION: Normal exam. Electronically Signed   By: Lorriane Shire M.D.   On: 08/31/2019 15:45    My personal review of EKG: Pending   Assessment & Plan:    Principal Problem: Severe sepsis with target organ damage ([place]) Sepsis with associated acute kidney injury.  Admit to telemetry.  I will order 1 L normal saline bolus.  Follow lactate and procalcitonin. Given empiric IV Rocephin.  Follow blood culture.  UA and urine culture ordered in the ED. Tylenol as needed for fever. Obtain CT of the abdomen pelvis without contrast.  Routine COVID-19 testing ordered.  Active Problems: Suspected COVID-19 POC was negative however patient does have fever with chills, shortness of breath, fatigue and diarrhea.  Will follow routine COVID-19 testing.  Check inflammatory markers including procalcitonin, CRP, D-dimer, ferritin and fibrinogen.  Maintain airborne and contact isolation.  Also ordered enteric precaution and GI pathogen panel sent.   ?Urinary retention and ? Fecal incontinence History is more likely associated with infection and associated diarrhea. He has no hs of fall ro back injury, no low back tenderness, no urinary retention on bladder scan.soiling with feces is off and on  only.. he has no lower extremely weakness, or neurological deficit. Will monitor for now.     Essential hypertension Resume Coreg and amlodipine.  Hold ACE inhibitor given AKI  Hypothyroidism Resume Synthroid    Coronary artery disease Check EKG.  Troponin negative.     AAA (abdominal aortic aneurysm) without rupture (HCC) Follow-up as outpatient   DVT Prophylaxis: Subcu heparin  AM Labs Ordered, also please review Full Orders  Family Communication: Admission, patients condition and plan of care including tests being ordered have been discussed with the patient, his wife at bedside and daughter on the phone  Code Status full code  Likely DC to home possibly in the next 48-72 hours once improved  Condition GUARDED    Consults called: None  Admission status: Inpatient Patient presenting with sepsis associated with fever, leukocytosis, shortness of breath of unclear etiology (possible urinary) versus rule out COVID-19 infection.  Patient is at high risk for further deterioration of his symptoms and progressing to septic shock.  He needs to be monitored in an inpatient setting for at least >2 midnights.  Time spent in minutes : 70   Olaoluwa Grieder M.D on 08/31/2019 at 5:28 PM  Between 7am to 7pm - Pager - [phone]. After 7pm go to [email or web address] - password [facility]  [facility] - Office  [phone]

## 2019-08-31 NOTE — Progress Notes (Signed)
CRITICAL VALUE ALERT  Critical Value:  Lactic Acid 2.1  Date & Time Notied:  11/25 1927  Provider Notified: Kennon Holter NP  Orders Received/Actions taken:

## 2019-08-31 NOTE — ED Notes (Signed)
Report given to Mollie Germany, RN on 5E.

## 2019-08-31 NOTE — ED Notes (Signed)
Bladder scan showed 82mL x2.

## 2019-08-31 NOTE — Progress Notes (Signed)
Pt arrived to rm 1511. VS stable and pt is resting.

## 2019-08-31 NOTE — ED Provider Notes (Signed)
[facility]-EMERGENCY DEPT Provider Note   CSN: [id number] Arrival date & time: 08/31/19  1400     History   Chief Complaint Chief Complaint  Patient presents with  . Urinary Retention    HPI Bryan Carter is a 78 y.o. male.  Presents to ER with concern for decreased urination.  Patient reports since yesterday he has noticed significant decrease in his urine function.  Patient also reports that over the last couple weeks he has noticed increasing cough, intermittent shortness of breath.  Denies any abdominal pain, distention, no vomiting or diarrhea.  Reports fever to 102F at home this am. Has not taken any new meds.      HPI  Past Medical History:  Diagnosis Date  . AAA (abdominal aortic aneurysm) (HCC)   . Arthritis    "joints, back" (11/12/2017)  . Coronary artery disease   . Hyperlipidemia   . Hypertension   . Hypothyroidism   . Myocardial infarction [facility]) 2008    Patient Active Problem List   Diagnosis Date Noted  . New-onset angina (HCC) 10/16/2016  . AAA (abdominal aortic aneurysm) without rupture (HCC) 08/07/2014  . HYPERLIPIDEMIA 10/16/2010  . HYPERTENSION 10/16/2010  . MYOCARDIAL INFARCTION 10/16/2010  . HYPERSOMNIA 10/16/2010    Past Surgical History:  Procedure Laterality Date  . ABDOMINAL EXPLORATION SURGERY  ~ 2006   "had to open me up and put mesh in; 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; caused a hernia"  . CARDIAC CATHETERIZATION N/A 10/16/2016   Procedure: Left Heart Cath and Coronary Angiography;  Surgeon: Rinaldo Cloud, MD;  Location: [facility] INVASIVE CV LAB;  Service: Cardiovascular;  Laterality: N/A;  . CARDIAC CATHETERIZATION N/A 10/16/2016   Procedure: Coronary Stent Intervention;  Surgeon: Rinaldo Cloud, MD;  Location: [facility] INVASIVE CV LAB;  Service: Cardiovascular;  Laterality: N/A;  . CATARACT EXTRACTION W/ INTRAOCULAR LENS  IMPLANT, BILATERAL Bilateral   . COLON SURGERY  2006   S/P colonoscopy punctures  . COLONOSCOPY  2006   "punctured my colon; ended up in hospital"  . CORONARY ANGIOGRAPHY N/A 11/12/2017   Procedure: CORONARY ANGIOGRAPHY;  Surgeon: Rinaldo Cloud, MD;  Location: [facility] INVASIVE CV LAB;  Service: Cardiovascular;  Laterality: N/A;  . CORONARY ANGIOPLASTY    . CORONARY ANGIOPLASTY WITH STENT PLACEMENT  2008  . CORONARY BALLOON ANGIOPLASTY N/A 11/12/2017   Procedure: CORONARY BALLOON ANGIOPLASTY;  Surgeon: Rinaldo Cloud, MD;  Location: [facility] INVASIVE CV LAB;  Service: Cardiovascular;  Laterality: N/A;  . HERNIA REPAIR  ~ 2006        Home Medications    Prior to Admission medications   Medication Sig Start Date End Date Taking? Authorizing Provider  amLODipine (NORVASC) 10 MG tablet Take 0.5 tablets (5 mg total) by mouth daily. 11/13/17   Rinaldo Cloud, MD  aspirin 81 MG tablet Take 81 mg by mouth daily.    [provider]  atorvastatin (LIPITOR) 80 MG tablet Take 1 tablet (80 mg total) by mouth at bedtime. 10/17/16   Rinaldo Cloud, MD  carvedilol (COREG) 12.5 MG tablet Take 12.5 mg by mouth 2 (two) times daily with a meal.    [provider]  finasteride (PROSCAR) 5 MG tablet Take 5 mg by mouth daily.    [provider]  levothyroxine (SYNTHROID, LEVOTHROID) 75 MCG tablet Take 75 mcg by mouth daily before breakfast.    [provider]  losartan (COZAAR) 100 MG tablet Take 100 mg by mouth daily.    [provider]  Multiple Vitamin (  MULTIVITAMIN) tablet Take 1 tablet daily by mouth.    [provider]  nitroGLYCERIN (NITROSTAT) 0.4 MG SL tablet Place 1 tablet (0.4 mg total) under the tongue every 5 (five) minutes as needed for chest pain. 11/13/17 11/13/18  Rinaldo Cloud, MD  Omega-3 Fatty Acids (FISH OIL PO) Take 1 capsule by mouth daily.     [provider]  spironolactone (ALDACTONE) 25 MG tablet Take 25 mg by mouth daily.    [provider]  tamsulosin (FLOMAX) 0.4 MG CAPS capsule Take 0.4 mg by mouth daily.    [provider]   ticagrelor (BRILINTA) 90 MG TABS tablet Take 1 tablet (90 mg total) by mouth 2 (two) times daily. 10/17/16   Rinaldo Cloud, MD    Family History Family History  Problem Relation Age of Onset  . Heart attack Mother   . Heart disease Mother   . Heart attack Father   . Heart disease Father     Social History Social History   Tobacco Use  . Smoking status: Former Smoker    Packs/day: 1.00    Years: 50.00    Pack years: 50.00    Types: Cigarettes    Quit date: 10/06/2006    Years since quitting: 12.9  . Smokeless tobacco: Never Used  Substance Use Topics  . Alcohol use: Yes    Alcohol/week: 0.0 standard drinks    Comment: 11/12/2017  "might drink a beer once/year"  . Drug use: Yes    Types: GHB    Comment: 10/16/2016 & 11/12/2017 pt denies hx of drug use     Allergies   Quinapril   Review of Systems Review of Systems  Constitutional: Negative for chills and fever.  HENT: Negative for ear pain and sore throat.   Eyes: Negative for pain and visual disturbance.  Respiratory: Positive for cough and shortness of breath.   Cardiovascular: Negative for chest pain and palpitations.  Gastrointestinal: Negative for abdominal pain and vomiting.  Genitourinary: Positive for difficulty urinating. Negative for dysuria and hematuria.  Musculoskeletal: Negative for arthralgias and back pain.  Skin: Negative for color change and rash.  Neurological: Negative for seizures and syncope.  All other systems reviewed and are negative.    Physical Exam Updated Vital Signs BP (!) 148/76 (BP Location: Right Arm)   Pulse 99   Temp (!) 100.5 F (38.1 C) (Oral)   Resp 17   SpO2 96%   Physical Exam Vitals signs and nursing note reviewed.  Constitutional:      Appearance: He is well-developed.  HENT:     Head: Normocephalic and atraumatic.  Eyes:     Conjunctiva/sclera: Conjunctivae normal.  Neck:     Musculoskeletal: Neck supple.  Cardiovascular:     Rate and Rhythm: Normal rate and  regular rhythm.     Heart sounds: No murmur.  Pulmonary:     Effort: Pulmonary effort is normal. No respiratory distress.     Breath sounds: Normal breath sounds.  Abdominal:     Palpations: Abdomen is soft.     Tenderness: There is no abdominal tenderness.  Skin:    General: Skin is warm and dry.     Capillary Refill: Capillary refill takes less than 2 seconds.  Neurological:     General: No focal deficit present.     Mental Status: He is alert and oriented to person, place, and time. Mental status is at baseline.  Psychiatric:        Mood and Affect:  Mood normal.        Behavior: Behavior normal.      ED Treatments / Results  Labs (all labs ordered are listed, but only abnormal results are displayed) Labs Reviewed - No data to display  EKG None  Radiology No results found.  Procedures Procedures (including critical care time)  Medications Ordered in ED Medications - No data to display   Initial Impression / Assessment and Plan / ED Course  I have reviewed the triage vital signs and the nursing notes.  Pertinent labs & imaging results that were available during my care of the patient were reviewed by me and considered in my medical decision making (see chart for details).  Clinical Course as of Aug 31 1611  Wed Aug 31, 2019  1522 Complete initial assessment, bladder is empty, concern for renal failure, also reports fever at home, shortness of breath, will broaden work-up, check cultures and reassess   [RD]    Clinical Course User Index [RD] Lucrezia Starch, MD      78 year old male presents to the ER with initial complaint of difficulty urinating.  On further history though, concern recent fever, reported cough and shortness of breath.  Bladder was empty, no retention.  Abdomen soft.  Blood work concerning for significant leukocytosis, AKI.  Given reported fevers, labs, believe patient would benefit from empiric antibiotics and observation and rehydration.   Gave Rocephin, small fluid bolus (limited due to patient's reported history of heart failure).  Discussed case with hospitalist service who agrees to admit patient.  Sent for Covid swab, pending at time of admission.  Dr. Clementeen Graham accepting.  Final Clinical Impressions(s) / ED Diagnoses   Final diagnoses:  Leukocytosis, unspecified type  AKI (acute kidney injury) [facility])    ED Discharge Orders    None       Lucrezia Starch, MD 08/31/19 1708

## 2019-09-01 LAB — GASTROINTESTINAL PANEL BY PCR, STOOL (REPLACES STOOL CULTURE)

## 2019-09-01 LAB — COMPREHENSIVE METABOLIC PANEL
ALT: 21 U/L (ref 0–44)
AST: 49 U/L — ABNORMAL HIGH (ref 15–41)
Albumin: 3.1 g/dL — ABNORMAL LOW (ref 3.5–5.0)
Alkaline Phosphatase: 73 U/L (ref 38–126)
Anion gap: 9 (ref 5–15)
BUN: 31 mg/dL — ABNORMAL HIGH (ref 8–23)
CO2: 19 mmol/L — ABNORMAL LOW (ref 22–32)
Calcium: 9 mg/dL (ref 8.9–10.3)
Chloride: 109 mmol/L (ref 98–111)
Creatinine, Ser: 1.68 mg/dL — ABNORMAL HIGH (ref 0.61–1.24)
GFR calc Af Amer: 44 mL/min — ABNORMAL LOW (ref >60)
GFR calc non Af Amer: 38 mL/min — ABNORMAL LOW (ref >60)
Glucose, Bld: 105 mg/dL — ABNORMAL HIGH (ref 70–99)
Potassium: 3.7 mmol/L (ref 3.5–5.1)
Sodium: 137 mmol/L (ref 135–145)
Total Bilirubin: 0.5 mg/dL (ref 0.3–1.2)
Total Protein: 6.6 g/dL (ref 6.5–8.1)

## 2019-09-01 LAB — CBC
HCT: 31.1 % — ABNORMAL LOW (ref 39.0–52.0)
Hemoglobin: 10.1 g/dL — ABNORMAL LOW (ref 13.0–17.0)
MCH: 30.6 pg (ref 26.0–34.0)
MCHC: 32.5 g/dL (ref 30.0–36.0)
MCV: 94.2 fL (ref 80.0–100.0)
Platelets: 166 10*3/uL (ref 150–400)
RBC: 3.3 MIL/uL — ABNORMAL LOW (ref 4.22–5.81)
RDW: 15.5 % (ref 11.5–15.5)
WBC: 18.1 10*3/uL — ABNORMAL HIGH (ref 4.0–10.5)
nRBC: 0 % (ref 0.0–0.2)

## 2019-09-01 LAB — PROCALCITONIN: Procalcitonin: 0.41 ng/mL

## 2019-09-01 LAB — URINALYSIS, ROUTINE W REFLEX MICROSCOPIC
Bilirubin Urine: NEGATIVE
Glucose, UA: NEGATIVE mg/dL
Ketones, ur: NEGATIVE mg/dL
Nitrite: NEGATIVE
Protein, ur: 100 mg/dL — AB
Specific Gravity, Urine: 1.014 (ref 1.005–1.030)
pH: 5 (ref 5.0–8.0)

## 2019-09-01 LAB — CREATININE, URINE, RANDOM: Creatinine, Urine: 131.08 mg/dL

## 2019-09-01 LAB — C-REACTIVE PROTEIN: CRP: 18.3 mg/dL — ABNORMAL HIGH (ref <1.0)

## 2019-09-01 LAB — SARS CORONAVIRUS 2 (TAT 6-24 HRS): SARS Coronavirus 2: NEGATIVE

## 2019-09-01 LAB — D-DIMER, QUANTITATIVE: D-Dimer, Quant: 3.55 ug/mL-FEU — ABNORMAL HIGH (ref 0.00–0.50)

## 2019-09-01 LAB — SODIUM, URINE, RANDOM: Sodium, Ur: 37 mmol/L

## 2019-09-01 LAB — CK: Total CK: 1887 U/L — ABNORMAL HIGH (ref 49–397)

## 2019-09-01 LAB — FIBRINOGEN: Fibrinogen: 609 mg/dL — ABNORMAL HIGH (ref 210–475)

## 2019-09-01 MED ORDER — ADULT MULTIVITAMIN W/MINERALS CH
1.0000 | ORAL_TABLET | Freq: Every day | ORAL | Status: DC
  Administered 2019-09-01 – 2019-09-02 (×2): 1 via ORAL
  Filled 2019-09-01 (×2): qty 1

## 2019-09-01 MED ORDER — AMLODIPINE BESYLATE 5 MG PO TABS
5.0000 mg | ORAL_TABLET | Freq: Every day | ORAL | Status: DC
  Administered 2019-09-01 – 2019-09-03 (×3): 5 mg via ORAL
  Filled 2019-09-01 (×3): qty 1

## 2019-09-01 MED ORDER — LEVOTHYROXINE SODIUM 75 MCG PO TABS
75.0000 ug | ORAL_TABLET | Freq: Every day | ORAL | Status: DC
  Administered 2019-09-01 – 2019-09-03 (×3): 75 ug via ORAL
  Filled 2019-09-01 (×3): qty 1

## 2019-09-01 MED ORDER — VITAMIN B-12 1000 MCG PO TABS
5000.0000 ug | ORAL_TABLET | Freq: Every day | ORAL | Status: DC
  Administered 2019-09-01 – 2019-09-03 (×3): 5000 ug via ORAL
  Filled 2019-09-01 (×3): qty 5

## 2019-09-01 MED ORDER — TAMSULOSIN HCL 0.4 MG PO CAPS
0.4000 mg | ORAL_CAPSULE | Freq: Every day | ORAL | Status: DC
  Administered 2019-09-01 – 2019-09-02 (×2): 0.4 mg via ORAL
  Filled 2019-09-01 (×2): qty 1

## 2019-09-01 MED ORDER — TICAGRELOR 90 MG PO TABS
90.0000 mg | ORAL_TABLET | Freq: Two times a day (BID) | ORAL | Status: DC
  Administered 2019-09-01 – 2019-09-03 (×5): 90 mg via ORAL
  Filled 2019-09-01 (×6): qty 1

## 2019-09-01 MED ORDER — CARVEDILOL 25 MG PO TABS
25.0000 mg | ORAL_TABLET | Freq: Two times a day (BID) | ORAL | Status: DC
  Administered 2019-09-01 – 2019-09-03 (×5): 25 mg via ORAL
  Filled 2019-09-01 (×5): qty 1

## 2019-09-01 MED ORDER — ALBUTEROL SULFATE (2.5 MG/3ML) 0.083% IN NEBU: 2.5000 mg | INHALATION_SOLUTION | Freq: Four times a day (QID) | RESPIRATORY_TRACT | Status: DC | PRN

## 2019-09-01 MED ORDER — FINASTERIDE 5 MG PO TABS
5.0000 mg | ORAL_TABLET | Freq: Every day | ORAL | Status: DC
  Administered 2019-09-01 – 2019-09-02 (×2): 5 mg via ORAL
  Filled 2019-09-01 (×2): qty 1

## 2019-09-01 MED ORDER — OMEGA-3-ACID ETHYL ESTERS 1 G PO CAPS
1.0000 g | ORAL_CAPSULE | Freq: Every day | ORAL | Status: DC
  Administered 2019-09-01 – 2019-09-03 (×3): 1 g via ORAL
  Filled 2019-09-01 (×3): qty 1

## 2019-09-01 NOTE — Plan of Care (Signed)

## 2019-09-01 NOTE — Progress Notes (Signed)
RN returned called to pt's daughter. No answer, lvm.

## 2019-09-01 NOTE — Progress Notes (Signed)
RN spoke with pt's daughter to give update. Daughter stated she will visit in next half hour.

## 2019-09-01 NOTE — Progress Notes (Signed)
Pt still having loose BM's though last one had some mushy/solid pieces as well, stool sample sent to lab, no complaints of abdominal pain at this time, no resp distress noted, ambulates to BR with steady gait, vss, call bell in reach, fluids infusing, will monitor.

## 2019-09-01 NOTE — Progress Notes (Signed)
PROGRESS NOTE                                                                                                                                                                                                             Patient Demographics:    Bryan Carter, is a 78 y.o. male, DOB - 26-Aug-1941, MVV:[id number]  Admit date - 08/31/2019   Admitting Physician Louellen Molder, MD  Outpatient Primary MD for the patient is Nolene Ebbs, MD  LOS - 1  Outpatient Specialists:  Chief Complaint  Patient presents with  . Urinary Retention       Brief Narrative   78 year old obese male with history of hypertension, hyperlipidemia, hypothyroidism, with history of NSTEMI, abdominal aortic aneurysm presented with minimal urinary output for 1 day duration and weakness with poor p.o. intake for 1 week.  This is also associated with frequent loose watery stools.  Patient initially went to his urologist where in and out catheterization showed only 30 cc urine and was found to have fever of 102.5 F. In the ED patient was septic with fever of 100.5 F, WBC of 17 K, elevated lactic acid of 2 and acute kidney injury. Given normal saline bolus and empiric IV antibiotic.  Blood and urine cultures sent and admitted to telemetry.  COVID-19 was tested and resulted as negative.   Subjective:   Reports feeling less tired and able to ambulate to the bathroom without difficulty.  Still having off-and-on loose bowel movement but much improved from yesterday.  Was able to put out 500 cc urine overnight.   Assessment  & Plan :    Principal Problem: Severe  sepsis ([place]) Etiology unclear.  Acute viral illness versus UTI.  UA is positive.  Continue empiric IV Rocephin.  Follow blood and urine culture.  Sepsis currently resolved.  Patient had elevated ESR, CRP, fibrinogen and D-dimer.  Recheck inflammatory markers today.  COVID-19 was tested negative.   Abdominal CT and chest x-ray without acute infection or infiltrate..  Continue IV hydration. Maintain enteric precaution and follow GI pathogen panel.  Active Problems:   AKI (acute kidney injury) ([place]) Suspect prerenal ATN with sepsis and dehydration.  Hold Lasix, losartan and Aldactone.  Improving with IV fluids.  Avoid nephrotoxins.  Oliguria and/anuria Suspect due to dehydration.  Urine output improving with IV fluids.  Monitor  strict I's/O  Diarrhea Maintain enteric precaution and check GI pathogen panel.  No recent antibiotic use.    Essential hypertension Stable.  Resume amlodipine and Coreg.  Hold Lasix, losartan and Aldactone given his AKI.  Rhabdomyolysis, nontraumatic CK in 700s.  Hold statin and monitor.  Hypothyroidism Continue Synthroid  Coronary artery disease Stable.  Troponin negative.   Abdominal aortic aneurysm without rupture Outpatient follow-up  Code Status : Full code  Family Communication  : None at bedside  Disposition Plan  : Home possibly in the next 48 hours if improved  Barriers For Discharge : Active symptoms  Consults  : None  Procedures  : CT abdomen  DVT Prophylaxis  : Subcu Lovenox  Lab Results  Component Value Date   PLT 166 09/01/2019    Antibiotics  :    Anti-infectives (From admission, onward)   Start     Dose/Rate Route Frequency Ordered Stop   09/01/19 1600  cefTRIAXone (ROCEPHIN) 1 g in sodium chloride 0.9 % 100 mL IVPB     1 g 200 mL/hr over 30 Minutes Intravenous Every 24 hours 08/31/19 2213     08/31/19 1645  cefTRIAXone (ROCEPHIN) 1 g in sodium chloride 0.9 % 100 mL IVPB     1 g 200 mL/hr over 30 Minutes Intravenous  Once 08/31/19 1637 08/31/19 1737        Objective:   Vitals:   08/31/19 1738 08/31/19 1816 08/31/19 2135 09/01/19 0300  BP:  (!) 152/70 (!) 147/71 140/68  Pulse:  89 90 92  Resp:  (!) '21 20 18  '$ Temp: 100 F (37.8 C) 99.2 F (37.3 C) (!) 102.1 F (38.9 C) 99 F (37.2 C)  TempSrc: Oral  Oral Oral Oral  SpO2:  100% 100% 99%  Weight:    105.2 kg  Height:    '5\' 6"'$  (1.676 m)    Wt Readings from Last 3 Encounters:  09/01/19 105.2 kg  08/26/18 106.1 kg  11/13/17 102.5 kg     Intake/Output Summary (Last 24 hours) at 09/01/2019 1320 Last data filed at 09/01/2019 0930 Gross per 24 hour  Intake 1727.04 ml  Output 700 ml  Net 1027.04 ml     Physical Exam  Gen: Elderly obese male not in distress HEENT: Moist mucosa, supple neck Chest: Clear bilaterally CVs: Normal S1-S2, no murmurs GI: Soft, nondistended, nontender, bowel sounds present Musculoskeletal: Warm, no edema     Data Review:    CBC Recent Labs  Lab 08/31/19 1515 09/01/19 0557  WBC 17.3* 18.1*  HGB 11.0* 10.1*  HCT 34.1* 31.1*  PLT 192 166  MCV 95.0 94.2  MCH 30.6 30.6  MCHC 32.3 32.5  RDW 15.4 15.5  LYMPHSABS 1.1  --   MONOABS 1.7*  --   EOSABS 0.0  --   BASOSABS 0.0  --     Chemistries  Recent Labs  Lab 08/31/19 1513 08/31/19 1515 09/01/19 0557  NA  --  137 137  K  --  4.1 3.7  CL  --  108 109  CO2  --  19* 19*  GLUCOSE  --  116* 105*  BUN  --  31* 31*  CREATININE  --  1.84* 1.68*  CALCIUM  --  9.3 9.0  AST 28  --  49*  ALT 20  --  21  ALKPHOS 88  --  73  BILITOT 0.9  --  0.5   ------------------------------------------------------------------------------------------------------------------ No results for input(s): CHOL, HDL, LDLCALC, TRIG, CHOLHDL, LDLDIRECT in  the last 72 hours.  No results found for: HGBA1C ------------------------------------------------------------------------------------------------------------------ No results for input(s): TSH, T4TOTAL, T3FREE, THYROIDAB in the last 72 hours.  Invalid input(s): FREET3 ------------------------------------------------------------------------------------------------------------------ Recent Labs    08/31/19 1833  FERRITIN 218    Coagulation profile No results for input(s): INR, PROTIME in the last 168  hours.  Recent Labs    08/31/19 1515  DDIMER 8.25*    Cardiac Enzymes No results for input(s): CKMB, TROPONINI, MYOGLOBIN in the last 168 hours.  Invalid input(s): CK ------------------------------------------------------------------------------------------------------------------    Component Value Date/Time   BNP 64.0 08/31/2019 1513    Inpatient Medications  Scheduled Meds: . amLODipine  5 mg Oral Daily  . carvedilol  25 mg Oral BID WC  . finasteride  5 mg Oral QHS  . heparin  5,000 Units Subcutaneous Q8H  . levothyroxine  75 mcg Oral QAC breakfast  . multivitamin with minerals  1 tablet Oral Daily  . omega-3 acid ethyl esters  1 g Oral Daily  . tamsulosin  0.4 mg Oral QHS  . ticagrelor  90 mg Oral BID  . vitamin B-12  5,000 mcg Oral Daily   Continuous Infusions: . sodium chloride Stopped (08/31/19 2683)  . cefTRIAXone (ROCEPHIN)  IV     PRN Meds:.acetaminophen **OR** acetaminophen, albuterol, ondansetron **OR** ondansetron (ZOFRAN) IV  Micro Results Recent Results (from the past 240 hour(s))  Blood culture (routine x 2)     Status: None (Preliminary result)   Collection Time: 08/31/19  3:38 PM   Specimen: BLOOD  Result Value Ref Range Status   Specimen Description   Final    BLOOD LEFT ANTECUBITAL Performed at [facility] [address], [place], [place] [address]    Special Requests   Final    BOTTLES DRAWN AEROBIC AND ANAEROBIC Blood Culture adequate volume Performed at [facility] [address] [place], [place] [address]    Culture   Final    NO GROWTH < 24 HOURS Performed at [facility] [address] [place], [place] [address]    Report Status PENDING  Incomplete  SARS CORONAVIRUS 2 (TAT 6-24 HRS) Nasopharyngeal Nasopharyngeal Swab     Status: None   Collection Time: 08/31/19  4:45 PM   Specimen: Nasopharyngeal Swab  Result Value Ref Range Status   SARS Coronavirus 2 NEGATIVE NEGATIVE Final     Comment: (NOTE) SARS-CoV-2 target nucleic acids are NOT DETECTED. The SARS-CoV-2 RNA is generally detectable in upper and lower respiratory specimens during the acute phase of infection. Negative results do not preclude SARS-CoV-2 infection, do not rule out co-infections with other pathogens, and should not be used as the sole basis for treatment or other patient management decisions. Negative results must be combined with clinical observations, patient history, and epidemiological information. The expected result is Negative. Fact Sheet for Patients: SugarRoll.be Fact Sheet for Healthcare Providers: https://www.woods-mathews.com/ This test is not yet approved or cleared by the [place] FDA and  has been authorized for detection and/or diagnosis of SARS-CoV-2 by FDA under an Emergency Use Authorization (EUA). This EUA will remain  in effect (meaning this test can be used) for the duration of the COVID-19 declaration under Section 56 4(b)(1) of the Act, 21 U.S.C. section 360bbb-3(b)(1), unless the authorization is terminated or revoked sooner. Performed at [facility], [place] [address] [place], [place] [address]     Radiology Reports Ct Abdomen Pelvis Wo Contrast  Result Date: 08/31/2019 CLINICAL DATA:  Abdominal  distension, acute renal insufficiency EXAM: CT ABDOMEN AND PELVIS WITHOUT CONTRAST TECHNIQUE: Multidetector CT imaging of the abdomen and pelvis was performed following the standard protocol without IV contrast. COMPARISON:  01/04/2008 FINDINGS: Lower chest: No acute abnormality. Hepatobiliary: Liver is well visualized. A stable posterior left hepatic cyst is noted measuring just over 1 cm. The gallbladder is within normal limits. Pancreas: Unremarkable. No pancreatic ductal dilatation or surrounding inflammatory changes. Spleen: Normal in size without focal abnormality. Adrenals/Urinary Tract: Adrenal glands are within normal  limits. Kidneys are well visualized bilaterally without renal calculi or obstructive change. The ureters are within normal limits. The bladder is decompressed. Stomach/Bowel: Postsurgical changes are noted in the sigmoid. Mild diverticular changes noted. The appendix is within normal limits. No small bowel abnormality is seen. Small sliding-type hiatal hernia is noted. Vascular/Lymphatic: Diffuse atherosclerotic calcifications are identified. A focal area of aneurysmal dilatation to 4.4 cm is seen. No perianeurysmal inflammatory changes to suggest pending rupture are noted. No findings to suggest leakage are seen. No lymphadenopathy is noted. Reproductive: Prostate is unremarkable. Other: No abdominal wall hernia or abnormality. No abdominopelvic ascites. Musculoskeletal: Multilevel degenerative changes of lumbar spine are noted. 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 Lucency is noted in the superior endplate of L5 likely related to a large Schmorl's node. IMPRESSION: Changes consistent with the known abdominal aortic aneurysm. It now measures 4.4 cm. Recommend followup by [place] in 1 year. This recommendation follows ACR consensus guidelines: White Paper of the ACR Incidental Findings Committee II on Vascular Findings. J Am Coll Radiol 2013; 10:789-794. Chronic changes in the lumbar spine. Chronic changes as described above.  The No findings to correspond with the abdominal distension are seen. Electronically Signed   By: Inez Catalina M.D.   On: 08/31/2019 21:21   Dg Chest Portable 1 View  Result Date: 08/31/2019 CLINICAL DATA:  Shortness of breath.  Cough. EXAM: PORTABLE CHEST 1 VIEW COMPARISON:  05/29/2009 and CT angiogram of the chest dated 08/13/2015 FINDINGS: Cardiomediastinal silhouette is normal and the lungs are clear. No effusions. No significant bone abnormality. IMPRESSION: Normal exam. Electronically Signed   By: Lorriane Shire M.D.   On: 08/31/2019 15:45    Time  Spent in minutes  35   Rya Rausch M.D on 09/01/2019 at 1:20 PM  Between 7am to 7pm - Pager - [phone]  After 7pm go to [email or web address] - password [facility]  [facility] -  Office  [phone]

## 2019-09-02 ENCOUNTER — Inpatient Hospital Stay (HOSPITAL_COMMUNITY): Payer: Medicare Other

## 2019-09-02 DIAGNOSIS — R0602 Shortness of breath: Secondary | ICD-10-CM

## 2019-09-02 DIAGNOSIS — M6282 Rhabdomyolysis: Secondary | ICD-10-CM

## 2019-09-02 DIAGNOSIS — R7989 Other specified abnormal findings of blood chemistry: Secondary | ICD-10-CM

## 2019-09-02 LAB — HEPATIC FUNCTION PANEL
ALT: 25 U/L (ref 0–44)
AST: 46 U/L — ABNORMAL HIGH (ref 15–41)
Albumin: 2.8 g/dL — ABNORMAL LOW (ref 3.5–5.0)
Alkaline Phosphatase: 64 U/L (ref 38–126)
Bilirubin, Direct: <0.1 mg/dL (ref 0.0–0.2)
Total Bilirubin: 0.4 mg/dL (ref 0.3–1.2)
Total Protein: 6.1 g/dL — ABNORMAL LOW (ref 6.5–8.1)

## 2019-09-02 LAB — BASIC METABOLIC PANEL
Anion gap: 7 (ref 5–15)
BUN: 23 mg/dL (ref 8–23)
CO2: 18 mmol/L — ABNORMAL LOW (ref 22–32)
Calcium: 8.7 mg/dL — ABNORMAL LOW (ref 8.9–10.3)
Chloride: 113 mmol/L — ABNORMAL HIGH (ref 98–111)
Creatinine, Ser: 1.47 mg/dL — ABNORMAL HIGH (ref 0.61–1.24)
GFR calc Af Amer: 52 mL/min — ABNORMAL LOW (ref >60)
GFR calc non Af Amer: 45 mL/min — ABNORMAL LOW (ref >60)
Glucose, Bld: 103 mg/dL — ABNORMAL HIGH (ref 70–99)
Potassium: 3.9 mmol/L (ref 3.5–5.1)
Sodium: 138 mmol/L (ref 135–145)

## 2019-09-02 LAB — C-REACTIVE PROTEIN: CRP: 13.8 mg/dL — ABNORMAL HIGH (ref <1.0)

## 2019-09-02 LAB — CBC
HCT: 33 % — ABNORMAL LOW (ref 39.0–52.0)
Hemoglobin: 10.6 g/dL — ABNORMAL LOW (ref 13.0–17.0)
MCH: 30.6 pg (ref 26.0–34.0)
MCHC: 32.1 g/dL (ref 30.0–36.0)
MCV: 95.4 fL (ref 80.0–100.0)
Platelets: 171 10*3/uL (ref 150–400)
RBC: 3.46 MIL/uL — ABNORMAL LOW (ref 4.22–5.81)
RDW: 15.4 % (ref 11.5–15.5)
WBC: 12.1 10*3/uL — ABNORMAL HIGH (ref 4.0–10.5)
nRBC: 0 % (ref 0.0–0.2)

## 2019-09-02 LAB — D-DIMER, QUANTITATIVE: D-Dimer, Quant: 3.51 ug/mL-FEU — ABNORMAL HIGH (ref 0.00–0.50)

## 2019-09-02 LAB — CK: Total CK: 1086 U/L — ABNORMAL HIGH (ref 49–397)

## 2019-09-02 LAB — PROCALCITONIN: Procalcitonin: 0.29 ng/mL

## 2019-09-02 LAB — FIBRINOGEN: Fibrinogen: 704 mg/dL — ABNORMAL HIGH (ref 210–475)

## 2019-09-02 NOTE — Progress Notes (Signed)
Pt's daughter concerned with possible infection. Pt ambulated hall and became SOB on exertion, and with elevated D Dimer would like to make sure. MD paged.

## 2019-09-02 NOTE — Progress Notes (Signed)
PROGRESS NOTE                                                                                                                                                                                                             Patient Demographics:    Bryan Carter, is a 77 y.o. male, DOB - [birth year], OJJ:[id number]  Admit date - 08/31/2019   Admitting Physician Louellen Molder, MD  Outpatient Primary MD for the patient is Nolene Ebbs, MD  LOS - 2  Outpatient Specialists:  Chief Complaint  Patient presents with  . Urinary Retention       Brief Narrative   78 year old obese male with history of hypertension, hyperlipidemia, hypothyroidism, with history of NSTEMI, abdominal aortic aneurysm presented with minimal urinary output for 1 day duration and weakness with poor p.o. intake for 1 week.  This is also associated with frequent loose watery stools.  Patient initially went to his urologist where in and out catheterization showed only 30 cc urine and was found to have fever of 102.5 F. In the ED patient was septic with fever of 100.5 F, WBC of 17 K, elevated lactic acid of 2 and acute kidney injury. Given normal saline bolus and empiric IV antibiotic.  Blood and urine cultures sent and admitted to telemetry.  COVID-19 was tested and resulted as negative.   Subjective:   Reports weakness to be better.  Urine output improving.   Assessment  & Plan :    Principal Problem: Severe  sepsis ([place]) Etiology unclear.  Acute viral illness versus UTI.  UA is positive however urine culture not sent.  Continue empiric IV Rocephin.  Blood cultures negative to date.  Resolved. Patient had elevated ESR, CRP, fibrinogen and D-dimer.  Monitor daily.  COVID-19 was tested negative.  Abdominal CT and chest x-ray without acute infection or infiltrate..  Continue IV hydration.  GI pathogen panel negative.  Diarrhea resolved..  Active Problems:   AKI (acute kidney injury) ([place]) Suspect prerenal ATN with sepsis and dehydration.  Also had rhabdomyolysis.  Holding Lasix, losartan and Aldactone.  Slowly improving with fluids but not back to baseline yet.  Oliguria and/anuria Suspect due to dehydration.  Urine output improving with IV fluids.  CT abdomen negative for hydronephrosis.  Monitor strict I's/O  Diarrhea Resolved.  GI pathogen panel negative.  Essential hypertension Stable.  Resume amlodipine and Coreg.  Hold Lasix, losartan and Aldactone given his AKI.  Rhabdomyolysis, nontraumatic CK up to 1800, improving with fluids.  Statin on hold.  Hypothyroidism Continue Synthroid  Coronary artery disease Stable.  Troponin negative.   Abdominal aortic aneurysm without rupture Outpatient follow-up  Code Status : Full code  Family Communication  : We will update daughter.  Disposition Plan  : Home possibly tomorrow if symptoms continue to improve  Barriers For Discharge : Active symptoms  Consults  : None  Procedures  : CT abdomen  DVT Prophylaxis  : Subcu Lovenox  Lab Results  Component Value Date   PLT 166 09/01/2019    Antibiotics  :    Anti-infectives (From admission, onward)   Start     Dose/Rate Route Frequency Ordered Stop   09/01/19 1600  cefTRIAXone (ROCEPHIN) 1 g in sodium chloride 0.9 % 100 mL IVPB     1 g 200 mL/hr over 30 Minutes Intravenous Every 24 hours 08/31/19 2213     08/31/19 1645  cefTRIAXone (ROCEPHIN) 1 g in sodium chloride 0.9 % 100 mL IVPB     1 g 200 mL/hr over 30 Minutes Intravenous  Once 08/31/19 1637 08/31/19 1737        Objective:   Vitals:   09/01/19 0300 09/01/19 1439 09/01/19 2223 09/02/19 0433  BP: 140/68 128/81 (!) 159/67 (!) 142/68  Pulse: 92 75 75 69  Resp: _0 Temp: 99 F (37.2 C) 98.5 F (36.9 C) 99.5 F (37.5 C) 98.3 F (36.8 C)  TempSrc: Oral Oral Oral Oral  SpO2: 99% 99% 98% 100%  Weight: 105.2 kg     Height: _1  (1.676 m)       Wt  Readings from Last 3 Encounters:  09/01/19 105.2 kg  08/26/18 106.1 kg  11/13/17 102.5 kg     Intake/Output Summary (Last 24 hours) at 09/02/2019 1259 Last data filed at 09/02/2019 0853 Gross per 24 hour  Intake 1769.92 ml  Output 600 ml  Net 1169.92 ml    Physical exam Not in distress HEENT: Moist mucosa, supple neck Chest: Clear CVs: Normal S1-S2 GI: Soft, nondistended, nontender Musculoskeletal: Warm, no edema      Data Review:    CBC Recent Labs  Lab 08/31/19 1515 09/01/19 0557  WBC 17.3* 18.1*  HGB 11.0* 10.1*  HCT 34.1* 31.1*  PLT 192 166  MCV 95.0 94.2  MCH 30.6 30.6  MCHC 32.3 32.5  RDW 15.4 15.5  LYMPHSABS 1.1  --   MONOABS 1.7*  --   EOSABS 0.0  --   BASOSABS 0.0  --     Chemistries  Recent Labs  Lab 08/31/19 1513 08/31/19 1515 09/01/19 0557 09/02/19 0543  NA  --  137 137 138  K  --  4.1 3.7 3.9  CL  --  108 109 113*  CO2  --  19* 19* 18*  GLUCOSE  --  116* 105* 103*  BUN  --  31* 31* 23  CREATININE  --  1.84* 1.68* 1.47*  CALCIUM  --  9.3 9.0 8.7*  AST 28  --  49* 46*  ALT 20  --  21 25  ALKPHOS 88  --  73 64  BILITOT 0.9  --  0.5 0.4   ------------------------------------------------------------------------------------------------------------------ No results for input(s): CHOL, HDL, LDLCALC, TRIG, CHOLHDL, LDLDIRECT in the last 72 hours.  No results found for: HGBA1C ------------------------------------------------------------------------------------------------------------------ No results for input(s): TSH,  T4TOTAL, T3FREE, THYROIDAB in the last 72 hours.  Invalid input(s): FREET3 ------------------------------------------------------------------------------------------------------------------ Recent Labs    08/31/19 1833  FERRITIN 218    Coagulation profile No results for input(s): INR, PROTIME in the last 168 hours.  Recent Labs    09/01/19 1351 09/02/19 0817  DDIMER 3.55* 3.51*    Cardiac Enzymes No  results for input(s): CKMB, TROPONINI, MYOGLOBIN in the last 168 hours.  Invalid input(s): CK ------------------------------------------------------------------------------------------------------------------    Component Value Date/Time   BNP 64.0 08/31/2019 1513    Inpatient Medications  Scheduled Meds: . amLODipine  5 mg Oral Daily  . carvedilol  25 mg Oral BID WC  . finasteride  5 mg Oral QHS  . heparin  5,000 Units Subcutaneous Q8H  . levothyroxine  75 mcg Oral QAC breakfast  . multivitamin with minerals  1 tablet Oral Daily  . omega-3 acid ethyl esters  1 g Oral Daily  . tamsulosin  0.4 mg Oral QHS  . ticagrelor  90 mg Oral BID  . vitamin B-12  5,000 mcg Oral Daily   Continuous Infusions: . sodium chloride 100 mL/hr at 09/02/19 0405  . cefTRIAXone (ROCEPHIN)  IV 1 g (09/01/19 1637)   PRN Meds:.acetaminophen **OR** acetaminophen, albuterol, ondansetron **OR** ondansetron (ZOFRAN) IV  Micro Results Recent Results (from the past 240 hour(s))  Blood culture (routine x 2)     Status: None (Preliminary result)   Collection Time: 08/31/19  3:38 PM   Specimen: BLOOD  Result Value Ref Range Status   Specimen Description   Final    BLOOD LEFT ANTECUBITAL Performed at [facility] [address], [place], [place] [address]    Special Requests   Final    BOTTLES DRAWN AEROBIC AND ANAEROBIC Blood Culture adequate volume Performed at [facility] [address] [place], [place] [address]    Culture   Final    NO GROWTH 2 DAYS Performed at [facility] [address] [place], [place] [address]    Report Status PENDING  Incomplete  SARS CORONAVIRUS 2 (TAT 6-24 HRS) Nasopharyngeal Nasopharyngeal Swab     Status: None   Collection Time: 08/31/19  4:45 PM   Specimen: Nasopharyngeal Swab  Result Value Ref Range Status   SARS Coronavirus 2 NEGATIVE NEGATIVE Final    Comment: (NOTE) SARS-CoV-2 target nucleic acids are NOT  DETECTED. The SARS-CoV-2 RNA is generally detectable in upper and lower respiratory specimens during the acute phase of infection. Negative results do not preclude SARS-CoV-2 infection, do not rule out co-infections with other pathogens, and should not be used as the sole basis for treatment or other patient management decisions. Negative results must be combined with clinical observations, patient history, and epidemiological information. The expected result is Negative. Fact Sheet for Patients: SugarRoll.be Fact Sheet for Healthcare Providers: https://www.woods-mathews.com/ This test is not yet approved or cleared by the [place] FDA and  has been authorized for detection and/or diagnosis of SARS-CoV-2 by FDA under an Emergency Use Authorization (EUA). This EUA will remain  in effect (meaning this test can be used) for the duration of the COVID-19 declaration under Section 56 4(b)(1) of the Act, 21 U.S.C. section 360bbb-3(b)(1), unless the authorization is terminated or revoked sooner. Performed at [facility], [place] [address] [place], [place] [address]   Gastrointestinal Panel by PCR , Stool     Status: None   Collection Time: 08/31/19  5:25 PM   Specimen: STOOL  Result Value  Ref Range Status   Campylobacter species NOT DETECTED NOT DETECTED Final   Plesimonas shigelloides NOT DETECTED NOT DETECTED Final   Salmonella species NOT DETECTED NOT DETECTED Final   Yersinia enterocolitica NOT DETECTED NOT DETECTED Final   Vibrio species NOT DETECTED NOT DETECTED Final   Vibrio cholerae NOT DETECTED NOT DETECTED Final   Enteroaggregative E coli (EAEC) NOT DETECTED NOT DETECTED Final   Enteropathogenic E coli (EPEC) NOT DETECTED NOT DETECTED Final   Enterotoxigenic E coli (ETEC) NOT DETECTED NOT DETECTED Final   Shiga like toxin producing E coli (STEC) NOT DETECTED NOT DETECTED Final   Shigella/Enteroinvasive E coli (EIEC) NOT  DETECTED NOT DETECTED Final   Cryptosporidium NOT DETECTED NOT DETECTED Final   Cyclospora cayetanensis NOT DETECTED NOT DETECTED Final   Entamoeba histolytica NOT DETECTED NOT DETECTED Final   Giardia lamblia NOT DETECTED NOT DETECTED Final   Adenovirus F40/41 NOT DETECTED NOT DETECTED Final   Astrovirus NOT DETECTED NOT DETECTED Final   Norovirus GI/GII NOT DETECTED NOT DETECTED Final   Rotavirus A NOT DETECTED NOT DETECTED Final   Sapovirus (I, II, IV, and V) NOT DETECTED NOT DETECTED Final    Comment: Performed at [facility], [place] [place], [place] [address]  Blood culture (routine x 2)     Status: None (Preliminary result)   Collection Time: 09/01/19  5:34 PM   Specimen: BLOOD  Result Value Ref Range Status   Specimen Description   Final    BLOOD RIGHT ARM Performed at [facility], [place] [address] [place], [place] [address]    Special Requests   Final    BOTTLES DRAWN AEROBIC AND ANAEROBIC Blood Culture results may not be optimal due to an excessive volume of blood received in culture bottles Performed at [facility] [address], [place], [place] [address]    Culture   Final    NO GROWTH < 24 HOURS Performed at [facility] [address], [place], [name] [address]    Report Status PENDING  Incomplete    Radiology Reports Ct Abdomen Pelvis Wo Contrast  Result Date: 08/31/2019 CLINICAL DATA:  Abdominal distension, acute renal insufficiency EXAM: CT ABDOMEN AND PELVIS WITHOUT CONTRAST TECHNIQUE: Multidetector CT imaging of the abdomen and pelvis was performed following the standard protocol without IV contrast. COMPARISON:  01/04/2008 FINDINGS: Lower chest: No acute abnormality. Hepatobiliary: Liver is well visualized. A stable posterior left hepatic cyst is noted measuring just over 1 cm. The gallbladder is within normal limits. Pancreas: Unremarkable. No pancreatic ductal dilatation or surrounding  inflammatory changes. Spleen: Normal in size without focal abnormality. Adrenals/Urinary Tract: Adrenal glands are within normal limits. Kidneys are well visualized bilaterally without renal calculi or obstructive change. The ureters are within normal limits. The bladder is decompressed. Stomach/Bowel: Postsurgical changes are noted in the sigmoid. Mild diverticular changes noted. The appendix is within normal limits. No small bowel abnormality is seen. Small sliding-type hiatal hernia is noted. Vascular/Lymphatic: Diffuse atherosclerotic calcifications are identified. A focal area of aneurysmal dilatation to 4.4 cm is seen. No perianeurysmal inflammatory changes to suggest pending rupture are noted. No findings to suggest leakage are seen. No lymphadenopathy is noted. Reproductive: Prostate is unremarkable. Other: No abdominal wall hernia or abnormality. No abdominopelvic ascites. Musculoskeletal: Multilevel degenerative changes of lumbar spine are noted. 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 Lucency is noted in the superior endplate of L5 likely related to a  large Schmorl's node. IMPRESSION: Changes consistent with the known abdominal aortic aneurysm. It now measures 4.4 cm. Recommend followup by [place] in 1 year. This recommendation follows ACR consensus guidelines: White Paper of the ACR Incidental Findings Committee II on Vascular Findings. J Am Coll Radiol 2013; 10:789-794. Chronic changes in the lumbar spine. Chronic changes as described above.  The No findings to correspond with the abdominal distension are seen. Electronically Signed   By: Inez Catalina M.D.   On: 08/31/2019 21:21   Dg Chest Portable 1 View  Result Date: 08/31/2019 CLINICAL DATA:  Shortness of breath.  Cough. EXAM: PORTABLE CHEST 1 VIEW COMPARISON:  05/29/2009 and CT angiogram of the chest dated 08/13/2015 FINDINGS: Cardiomediastinal silhouette is normal and the lungs are clear. No effusions. No significant  bone abnormality. IMPRESSION: Normal exam. Electronically Signed   By: Lorriane Shire M.D.   On: 08/31/2019 15:45    Time Spent in minutes  35   Alene Bergerson M.D on 09/02/2019 at 12:59 PM  Between 7am to 7pm - Pager - [phone]  After 7pm go to [email or web address] - password [facility]  [facility] -  Office  [phone]

## 2019-09-02 NOTE — Progress Notes (Signed)
Bilateral lower extremity venous duplex has been completed. Preliminary results can be found in CV Proc through chart review.   09/02/19 3:31 PM Carlos Levering RVT

## 2019-09-03 DIAGNOSIS — R34 Anuria and oliguria: Secondary | ICD-10-CM

## 2019-09-03 DIAGNOSIS — A419 Sepsis, unspecified organism: Secondary | ICD-10-CM | POA: Diagnosis present

## 2019-09-03 DIAGNOSIS — N390 Urinary tract infection, site not specified: Secondary | ICD-10-CM | POA: Diagnosis present

## 2019-09-03 LAB — BASIC METABOLIC PANEL
Anion gap: 6 (ref 5–15)
BUN: 17 mg/dL (ref 8–23)
CO2: 20 mmol/L — ABNORMAL LOW (ref 22–32)
Calcium: 9.3 mg/dL (ref 8.9–10.3)
Chloride: 114 mmol/L — ABNORMAL HIGH (ref 98–111)
Creatinine, Ser: 1.21 mg/dL (ref 0.61–1.24)
GFR calc Af Amer: >60 mL/min (ref >60)
GFR calc non Af Amer: 57 mL/min — ABNORMAL LOW (ref >60)
Glucose, Bld: 103 mg/dL — ABNORMAL HIGH (ref 70–99)
Potassium: 3.8 mmol/L (ref 3.5–5.1)
Sodium: 140 mmol/L (ref 135–145)

## 2019-09-03 LAB — CK: Total CK: 647 U/L — ABNORMAL HIGH (ref 49–397)

## 2019-09-03 MED ORDER — CEPHALEXIN 500 MG PO CAPS: 500.0000 mg | ORAL_CAPSULE | Freq: Two times a day (BID) | ORAL | 0 refills | Status: AC

## 2019-09-03 MED ORDER — SPIRONOLACTONE 25 MG PO TABS: 25.0000 mg | ORAL_TABLET | Freq: Every day | ORAL | 0 refills | Status: AC

## 2019-09-03 MED FILL — CEPHALEXIN 500 MG CAPSULE: 500 | 4 days supply | Qty: 8 | Fill #0

## 2019-09-03 NOTE — Progress Notes (Signed)
Pt being discharged to home with daughter. Discharge instructions given to patient and daughter. Medication education provided to patient.

## 2019-09-03 NOTE — Discharge Summary (Signed)
Physician Discharge Summary  Bryan Carter XBW:[id number] DOB: 09/25/1941 DOA: 08/31/2019  PCP: Nolene Ebbs, MD  Admit date: 08/31/2019 Discharge date: 09/03/2019  Admitted From: Home Disposition: Home  Recommendations for Outpatient Follow-up:  #1 follow up with PCP in 1 week.  Patient will complete 7-day course of antibiotic on 12/1. 2.  Statin is being held due to rhabdomyolysis.  Recheck CPK and resume statin if normal.  Home Health: None Equipment/Devices none  Discharge Condition: Fair CODE STATUS: Full code Diet recommendation: Heart Healthy     Discharge Diagnoses:  Principal Problem:   Severe sepsis with acute organ dysfunction (HCC)   Active Problems:   AKI (acute kidney injury) ([place])   Essential hypertension   AAA (abdominal aortic aneurysm) without rupture (HCC)   Coronary artery disease   Obesity   Diarrhea   Oliguria and anuria   Sepsis secondary to UTI ([place]) Rhabdomyolysis, nontraumatic  Brief narrative/HPI Please refer to admission H&P for details, in brief,78 year old obese male with history of hypertension, hyperlipidemia, hypothyroidism, with history of NSTEMI, abdominal aortic aneurysm presented with minimal urinary output for 1 day duration and weakness with poor p.o. intake for 1 week.  This is also associated with frequent loose watery stools.  Patient initially went to his urologist where in and out catheterization showed only 30 cc urine and was found to have fever of 102.5 F. In the ED patient was septic with fever of 100.5 F, WBC of 17 K, elevated lactic acid of 2 and acute kidney injury. Given normal saline bolus and empiric IV antibiotic.  Blood and urine cultures sent and admitted to telemetry.  COVID-19 was tested and resulted as negative.  Full course Principal Problem: Severe  sepsis ([place]) Likely combination of UTI and acute viral illness. UA is positive however urine culture not sent.    Blood cultures negative.  Received  empiric IV Rocephin.  Sepsis now resolved.  He will be discharged on oral Keflex to complete 7-day course of antibiotic.  Patient had elevated ESR, CRP, fibrinogen and D-dimer which is improving. COVID-19 was tested negative.  Abdominal CT and chest x-ray without acute infection or infiltrate.. Doppler lower extremity negative for DVT.  Patient maintaining O2 sat both at rest and ambulation without any respiratory discomfort.  Active Problems:   AKI (acute kidney injury) ([place]) Suspect prerenal ATN with sepsis and dehydration.  Also had rhabdomyolysis.   Lasix, losartan and Aldactone held.  Renal function improved to baseline today.  Will resume Lasix and losartan.  Resume Aldactone after 3 days.  Oliguria and/anuria Suspect due to dehydration.  Urine output improving with IV fluids.  CT abdomen negative for hydronephrosis.    Renal function normalized.  Patient voiding without difficulty.  Diarrhea Possibly due to acute viral illness.   Resolved.  GI pathogen panel negative.     Essential hypertension Stable.    Resume home meds  Rhabdomyolysis, nontraumatic CK up to 1800, improving with fluids.  Statin on hold upon discharge and needs to be monitored as outpatient  Hypothyroidism Continue Synthroid  Coronary artery disease Stable.  Troponin negative.   Abdominal aortic aneurysm without rupture Has outpatient follow-up  Patient clinically stable and can be discharged home with outpatient follow-up with PCP.  Family Communication  :  Daughter updated on the phone  Disposition Plan  : Home   Consults  : None  Procedures  : CT abdomen Doppler lower extremity  Discharge Instructions   Allergies as of 09/03/2019  Reactions   Quinapril Shortness Of Breath, Cough      Medication List    STOP taking these medications   atorvastatin 80 MG tablet Commonly known as: LIPITOR     TAKE these medications   albuterol 108 (90 Base) MCG/ACT inhaler Commonly  known as: VENTOLIN HFA Inhale 2 puffs into the lungs every 6 (six) hours as needed for wheezing or shortness of breath.   amLODipine 5 MG tablet Commonly known as: NORVASC Take 5 mg by mouth daily. What changed: Another medication with the same name was removed. Continue taking this medication, and follow the directions you see here.   carvedilol 25 MG tablet Commonly known as: COREG Take 25 mg by mouth 2 (two) times daily.   cephALEXin 500 MG capsule Commonly known as: KEFLEX Take 1 capsule (500 mg total) by mouth 2 (two) times daily for 4 days.   Cyanocobalamin 5000 MCG Tbdp Take 5,000 mcg by mouth daily.   finasteride 5 MG tablet Commonly known as: PROSCAR Take 5 mg by mouth at bedtime.   FISH OIL PO Take 1 capsule by mouth 2 (two) times daily.   furosemide 40 MG tablet Commonly known as: LASIX Take 40 mg by mouth daily.   levothyroxine 75 MCG tablet Commonly known as: SYNTHROID Take 75 mcg by mouth daily before breakfast.   losartan 100 MG tablet Commonly known as: COZAAR Take 100 mg by mouth daily.   multivitamin tablet Take 1 tablet daily by mouth.   nitroGLYCERIN 0.4 MG SL tablet Commonly known as: Nitrostat Place 1 tablet (0.4 mg total) under the tongue every 5 (five) minutes as needed for chest pain.   spironolactone 25 MG tablet Commonly known as: ALDACTONE Take 1 tablet (25 mg total) by mouth daily. Start taking on: September 06, 2019 What changed: These instructions start on September 06, 2019. If you are unsure what to do until then, ask your doctor or other care provider.   tamsulosin 0.4 MG Caps capsule Commonly known as: FLOMAX Take 0.4 mg by mouth at bedtime.   ticagrelor 90 MG Tabs tablet Commonly known as: BRILINTA Take 1 tablet (90 mg total) by mouth 2 (two) times daily.      Follow-up Information    Nolene Ebbs, MD. Schedule an appointment as soon as possible for a visit in 1 week(s).   Specialty: Internal Medicine Contact  information: [place] [address] [phone]          Allergies  Allergen Reactions  . Quinapril Shortness Of Breath and Cough      Procedures/Studies: Ct Abdomen Pelvis Wo Contrast  Result Date: 08/31/2019 CLINICAL DATA:  Abdominal distension, acute renal insufficiency EXAM: CT ABDOMEN AND PELVIS WITHOUT CONTRAST TECHNIQUE: Multidetector CT imaging of the abdomen and pelvis was performed following the standard protocol without IV contrast. COMPARISON:  01/04/2008 FINDINGS: Lower chest: No acute abnormality. Hepatobiliary: Liver is well visualized. A stable posterior left hepatic cyst is noted measuring just over 1 cm. The gallbladder is within normal limits. Pancreas: Unremarkable. No pancreatic ductal dilatation or surrounding inflammatory changes. Spleen: Normal in size without focal abnormality. Adrenals/Urinary Tract: Adrenal glands are within normal limits. Kidneys are well visualized bilaterally without renal calculi or obstructive change. The ureters are within normal limits. The bladder is decompressed. Stomach/Bowel: Postsurgical changes are noted in the sigmoid. Mild diverticular changes noted. The appendix is within normal limits. No small bowel abnormality is seen. Small sliding-type hiatal hernia is noted. Vascular/Lymphatic: Diffuse atherosclerotic calcifications are identified.  A focal area of aneurysmal dilatation to 4.4 cm is seen. No perianeurysmal inflammatory changes to suggest pending rupture are noted. No findings to suggest leakage are seen. No lymphadenopathy is noted. Reproductive: Prostate is unremarkable. Other: No abdominal wall hernia or abnormality. No abdominopelvic ascites. Musculoskeletal: Multilevel degenerative changes of lumbar spine are noted. 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 Lucency is noted in the superior endplate of L5 likely related to a large Schmorl's node. IMPRESSION: Changes consistent with  the known abdominal aortic aneurysm. It now measures 4.4 cm. Recommend followup by [place] in 1 year. This recommendation follows ACR consensus guidelines: White Paper of the ACR Incidental Findings Committee II on Vascular Findings. J Am Coll Radiol 2013; 10:789-794. Chronic changes in the lumbar spine. Chronic changes as described above.  The No findings to correspond with the abdominal distension are seen. Electronically Signed   By: Inez Catalina M.D.   On: 08/31/2019 21:21   Dg Chest Portable 1 View  Result Date: 08/31/2019 CLINICAL DATA:  Shortness of breath.  Cough. EXAM: PORTABLE CHEST 1 VIEW COMPARISON:  05/29/2009 and CT angiogram of the chest dated 08/13/2015 FINDINGS: Cardiomediastinal silhouette is normal and the lungs are clear. No effusions. No significant bone abnormality. IMPRESSION: Normal exam. Electronically Signed   By: Lorriane Shire M.D.   On: 08/31/2019 15:45   Vas [place] Lower Extremity Venous (dvt)  Result Date: 09/03/2019  Lower Venous Study Indications: SOB, and Elevated Ddimer.  Risk Factors: None identified. Comparison Study: No prior studies. Performing Technologist: Oliver Hum RVT  Examination Guidelines: A complete evaluation includes B-mode imaging, spectral Doppler, color Doppler, and power Doppler as needed of all accessible portions of each vessel. Bilateral testing is considered an integral part of a complete examination. Limited examinations for reoccurring indications may be performed as noted.  +---------+---------------+---------+-----------+----------+--------------+ RIGHT    CompressibilityPhasicitySpontaneityPropertiesThrombus Aging +---------+---------------+---------+-----------+----------+--------------+ CFV      Full           Yes      Yes                                 +---------+---------------+---------+-----------+----------+--------------+ SFJ      Full                                                         +---------+---------------+---------+-----------+----------+--------------+ FV Prox  Full                                                        +---------+---------------+---------+-----------+----------+--------------+ FV Mid   Full                                                        +---------+---------------+---------+-----------+----------+--------------+ FV DistalFull                                                        +---------+---------------+---------+-----------+----------+--------------+  PFV      Full                                                        +---------+---------------+---------+-----------+----------+--------------+ POP      Full           Yes      Yes                                 +---------+---------------+---------+-----------+----------+--------------+ PTV      Full                                                        +---------+---------------+---------+-----------+----------+--------------+ PERO     Full                                                        +---------+---------------+---------+-----------+----------+--------------+   +---------+---------------+---------+-----------+----------+--------------+ LEFT     CompressibilityPhasicitySpontaneityPropertiesThrombus Aging +---------+---------------+---------+-----------+----------+--------------+ CFV      Full           Yes      Yes                                 +---------+---------------+---------+-----------+----------+--------------+ SFJ      Full                                                        +---------+---------------+---------+-----------+----------+--------------+ FV Prox  Full                                                        +---------+---------------+---------+-----------+----------+--------------+ FV Mid   Full                                                         +---------+---------------+---------+-----------+----------+--------------+ FV DistalFull                                                        +---------+---------------+---------+-----------+----------+--------------+ PFV      Full                                                        +---------+---------------+---------+-----------+----------+--------------+  POP      Full           Yes      Yes                                 +---------+---------------+---------+-----------+----------+--------------+ PTV      Full                                                        +---------+---------------+---------+-----------+----------+--------------+ PERO     Full                                                        +---------+---------------+---------+-----------+----------+--------------+     Summary: Right: There is no evidence of deep vein thrombosis in the lower extremity. No cystic structure found in the popliteal fossa. Left: There is no evidence of deep vein thrombosis in the lower extremity. No cystic structure found in the popliteal fossa.  *See table(s) above for measurements and observations. Electronically signed by Curt Jews MD on 09/03/2019 at 9:23:31 AM.    Final       Subjective: Feels much better.  Gets little short of breath on ambulation but sats maintained at 100%.  Has been voiding without difficulty.  Discharge Exam: Vitals:   09/03/19 0615 09/03/19 1017  BP: (!) 141/75 (!) 162/84  Pulse: 74 70  Resp: 20 20  Temp: 98.7 F (37.1 C) 98.7 F (37.1 C)  SpO2: 97% 100%   Vitals:   09/02/19 1532 09/02/19 1949 09/03/19 0615 09/03/19 1017  BP: 135/74 132/70 (!) 141/75 (!) 162/84  Pulse: 69 71 74 70  Resp: 20 (!) _0 Temp: 98.2 F (36.8 C) 98.4 F (36.9 C) 98.7 F (37.1 C) 98.7 F (37.1 C)  TempSrc: Oral  Oral Oral  SpO2: 98% 98% 97% 100%  Weight:      Height:        General: Elderly obese male not in distress HEENT: Moist  mucosa, supple neck Chest: Clear bilaterally CVs: Normal S1-S2, no murmurs GI: Soft, nondistended nontender Musculoskeletal: Warm, no edema    The results of significant diagnostics from this hospitalization (including imaging, microbiology, ancillary and laboratory) are listed below for reference.     Microbiology: Recent Results (from the past 240 hour(s))  Blood culture (routine x 2)     Status: None (Preliminary result)   Collection Time: 08/31/19  3:38 PM   Specimen: BLOOD  Result Value Ref Range Status   Specimen Description   Final    BLOOD LEFT ANTECUBITAL Performed at [facility] [address], [place], [place] [address]    Special Requests   Final    BOTTLES DRAWN AEROBIC AND ANAEROBIC Blood Culture adequate volume Performed at [facility] [address] [place], [place] [address]    Culture   Final    NO GROWTH 2 DAYS Performed at [facility] [address] [place], [place] [address]    Report Status PENDING  Incomplete  SARS CORONAVIRUS 2 (TAT 6-24 HRS) Nasopharyngeal Nasopharyngeal Swab  Status: None   Collection Time: 08/31/19  4:45 PM   Specimen: Nasopharyngeal Swab  Result Value Ref Range Status   SARS Coronavirus 2 NEGATIVE NEGATIVE Final    Comment: (NOTE) SARS-CoV-2 target nucleic acids are NOT DETECTED. The SARS-CoV-2 RNA is generally detectable in upper and lower respiratory specimens during the acute phase of infection. Negative results do not preclude SARS-CoV-2 infection, do not rule out co-infections with other pathogens, and should not be used as the sole basis for treatment or other patient management decisions. Negative results must be combined with clinical observations, patient history, and epidemiological information. The expected result is Negative. Fact Sheet for Patients: SugarRoll.be Fact Sheet for Healthcare  Providers: https://www.woods-mathews.com/ This test is not yet approved or cleared by the [place] FDA and  has been authorized for detection and/or diagnosis of SARS-CoV-2 by FDA under an Emergency Use Authorization (EUA). This EUA will remain  in effect (meaning this test can be used) for the duration of the COVID-19 declaration under Section 56 4(b)(1) of the Act, 21 U.S.C. section 360bbb-3(b)(1), unless the authorization is terminated or revoked sooner. Performed at [facility], [place] [address] [place], [place] [address]   Gastrointestinal Panel by PCR , Stool     Status: None   Collection Time: 08/31/19  5:25 PM   Specimen: STOOL  Result Value Ref Range Status   Campylobacter species NOT DETECTED NOT DETECTED Final   Plesimonas shigelloides NOT DETECTED NOT DETECTED Final   Salmonella species NOT DETECTED NOT DETECTED Final   Yersinia enterocolitica NOT DETECTED NOT DETECTED Final   Vibrio species NOT DETECTED NOT DETECTED Final   Vibrio cholerae NOT DETECTED NOT DETECTED Final   Enteroaggregative E coli (EAEC) NOT DETECTED NOT DETECTED Final   Enteropathogenic E coli (EPEC) NOT DETECTED NOT DETECTED Final   Enterotoxigenic E coli (ETEC) NOT DETECTED NOT DETECTED Final   Shiga like toxin producing E coli (STEC) NOT DETECTED NOT DETECTED Final   Shigella/Enteroinvasive E coli (EIEC) NOT DETECTED NOT DETECTED Final   Cryptosporidium NOT DETECTED NOT DETECTED Final   Cyclospora cayetanensis NOT DETECTED NOT DETECTED Final   Entamoeba histolytica NOT DETECTED NOT DETECTED Final   Giardia lamblia NOT DETECTED NOT DETECTED Final   Adenovirus F40/41 NOT DETECTED NOT DETECTED Final   Astrovirus NOT DETECTED NOT DETECTED Final   Norovirus GI/GII NOT DETECTED NOT DETECTED Final   Rotavirus A NOT DETECTED NOT DETECTED Final   Sapovirus (I, II, IV, and V) NOT DETECTED NOT DETECTED Final    Comment: Performed at [facility], [address]  [place], [place] [address]  Blood culture (routine x 2)     Status: None (Preliminary result)   Collection Time: 09/01/19  5:34 PM   Specimen: BLOOD  Result Value Ref Range Status   Specimen Description   Final    BLOOD RIGHT ARM Performed at [facility], [place] [address] [place], [place] [address]    Special Requests   Final    BOTTLES DRAWN AEROBIC AND ANAEROBIC Blood Culture results may not be optimal due to an excessive volume of blood received in culture bottles Performed at [facility] [address] [place], [place] [address]    Culture   Final    NO GROWTH < 24 HOURS Performed at [facility] [address] [place], [place] [address]    Report Status PENDING  Incomplete     Labs: BNP (last 3 results) Recent Labs  08/31/19 1513  BNP 16.1   Basic Metabolic Panel: Recent Labs  Lab 08/31/19 1515 09/01/19 0557 09/02/19 0543 09/03/19 0800  NA 137 137 138 140  K 4.1 3.7 3.9 3.8  CL 108 109 113* 114*  CO2 19* 19* 18* 20*  GLUCOSE 116* 105* 103* 103*  BUN 31* 31* 23 17  CREATININE 1.84* 1.68* 1.47* 1.21  CALCIUM 9.3 9.0 8.7* 9.3   Liver Function Tests: Recent Labs  Lab 08/31/19 1513 09/01/19 0557 09/02/19 0543  AST 28 49* 46*  ALT _0 ALKPHOS 88 73 64  BILITOT 0.9 0.5 0.4  PROT 7.4 6.6 6.1*  ALBUMIN 3.5 3.1* 2.8*   No results for input(s): LIPASE, AMYLASE in the last 168 hours. No results for input(s): AMMONIA in the last 168 hours. CBC: Recent Labs  Lab 08/31/19 1515 09/01/19 0557 09/02/19 1317  WBC 17.3* 18.1* 12.1*  NEUTROABS 14.3*  --   --   HGB 11.0* 10.1* 10.6*  HCT 34.1* 31.1* 33.0*  MCV 95.0 94.2 95.4  PLT 192 166 171   Cardiac Enzymes: Recent Labs  Lab 08/31/19 1833 09/01/19 1351 09/02/19 0543 09/03/19 0800  CKTOTAL 740* 1,887* 1,086* 647*   BNP: Invalid input(s): POCBNP CBG: No results for input(s): GLUCAP in the last 168 hours. D-Dimer Recent Labs    09/01/19 1351  09/02/19 0817  DDIMER 3.55* 3.51*   Hgb A1c No results for input(s): HGBA1C in the last 72 hours. Lipid Profile No results for input(s): CHOL, HDL, LDLCALC, TRIG, CHOLHDL, LDLDIRECT in the last 72 hours. Thyroid function studies No results for input(s): TSH, T4TOTAL, T3FREE, THYROIDAB in the last 72 hours.  Invalid input(s): FREET3 Anemia work up Recent Labs    08/31/19 1833  FERRITIN 218   Urinalysis    Component Value Date/Time   COLORURINE YELLOW 08/31/2019 [place] 08/31/2019 1515   LABSPEC 1.014 08/31/2019 1515   PHURINE 5.0 08/31/2019 1515   GLUCOSEU NEGATIVE 08/31/2019 1515   HGBUR SMALL (A) 08/31/2019 1515   BILIRUBINUR NEGATIVE 08/31/2019 1515   [place] 08/31/2019 1515   PROTEINUR 100 (A) 08/31/2019 1515   UROBILINOGEN 0.2 11/06/2007 2045   NITRITE NEGATIVE 08/31/2019 1515   LEUKOCYTESUR MODERATE (A) 08/31/2019 1515   Sepsis Labs Invalid input(s): PROCALCITONIN,  WBC,  LACTICIDVEN Microbiology Recent Results (from the past 240 hour(s))  Blood culture (routine x 2)     Status: None (Preliminary result)   Collection Time: 08/31/19  3:38 PM   Specimen: BLOOD  Result Value Ref Range Status   Specimen Description   Final    BLOOD LEFT ANTECUBITAL Performed at [name], [place] [address] [place], [place] [address]    Special Requests   Final    BOTTLES DRAWN AEROBIC AND ANAEROBIC Blood Culture adequate volume Performed at [facility] [address] [place], [place] [address]    Culture   Final    NO GROWTH 2 DAYS Performed at [facility] [address], [place], [place] [address]    Report Status PENDING  Incomplete  SARS CORONAVIRUS 2 (TAT 6-24 HRS) Nasopharyngeal Nasopharyngeal Swab     Status: None   Collection Time: 08/31/19  4:45 PM   Specimen: Nasopharyngeal Swab  Result Value Ref Range Status   SARS Coronavirus 2 NEGATIVE NEGATIVE Final    Comment: (NOTE) SARS-CoV-2  target nucleic acids are NOT DETECTED. The SARS-CoV-2 RNA is generally detectable in upper and lower respiratory specimens during the  acute phase of infection. Negative results do not preclude SARS-CoV-2 infection, do not rule out co-infections with other pathogens, and should not be used as the sole basis for treatment or other patient management decisions. Negative results must be combined with clinical observations, patient history, and epidemiological information. The expected result is Negative. Fact Sheet for Patients: SugarRoll.be Fact Sheet for Healthcare Providers: https://www.woods-mathews.com/ This test is not yet approved or cleared by the [place] FDA and  has been authorized for detection and/or diagnosis of SARS-CoV-2 by FDA under an Emergency Use Authorization (EUA). This EUA will remain  in effect (meaning this test can be used) for the duration of the COVID-19 declaration under Section 56 4(b)(1) of the Act, 21 U.S.C. section 360bbb-3(b)(1), unless the authorization is terminated or revoked sooner. Performed at [facility], [place] [address] [place], [place] [address]   Gastrointestinal Panel by PCR , Stool     Status: None   Collection Time: 08/31/19  5:25 PM   Specimen: STOOL  Result Value Ref Range Status   Campylobacter species NOT DETECTED NOT DETECTED Final   Plesimonas shigelloides NOT DETECTED NOT DETECTED Final   Salmonella species NOT DETECTED NOT DETECTED Final   Yersinia enterocolitica NOT DETECTED NOT DETECTED Final   Vibrio species NOT DETECTED NOT DETECTED Final   Vibrio cholerae NOT DETECTED NOT DETECTED Final   Enteroaggregative E coli (EAEC) NOT DETECTED NOT DETECTED Final   Enteropathogenic E coli (EPEC) NOT DETECTED NOT DETECTED Final   Enterotoxigenic E coli (ETEC) NOT DETECTED NOT DETECTED Final   Shiga like toxin producing E coli (STEC) NOT DETECTED NOT DETECTED Final    Shigella/Enteroinvasive E coli (EIEC) NOT DETECTED NOT DETECTED Final   Cryptosporidium NOT DETECTED NOT DETECTED Final   Cyclospora cayetanensis NOT DETECTED NOT DETECTED Final   Entamoeba histolytica NOT DETECTED NOT DETECTED Final   Giardia lamblia NOT DETECTED NOT DETECTED Final   Adenovirus F40/41 NOT DETECTED NOT DETECTED Final   Astrovirus NOT DETECTED NOT DETECTED Final   Norovirus GI/GII NOT DETECTED NOT DETECTED Final   Rotavirus A NOT DETECTED NOT DETECTED Final   Sapovirus (I, II, IV, and V) NOT DETECTED NOT DETECTED Final    Comment: Performed at [facility], [address] [place] [address] [place], [place] [address]  Blood culture (routine x 2)     Status: None (Preliminary result)   Collection Time: 09/01/19  5:34 PM   Specimen: BLOOD  Result Value Ref Range Status   Specimen Description   Final    BLOOD RIGHT ARM Performed at [facility], [place] [address], [place], [place] [address]    Special Requests   Final    BOTTLES DRAWN AEROBIC AND ANAEROBIC Blood Culture results may not be optimal due to an excessive volume of blood received in culture bottles Performed at [facility] [address], [place], [place] [address]    Culture   Final    NO GROWTH < 24 HOURS Performed at [facility] [address] [place],  [address]    Report Status PENDING  Incomplete     Time coordinating discharge: 35 minutes  SIGNED:   Louellen Molder, MD  [facility] 09/03/2019, 11:32 AM Pager   If 7PM-7AM, please contact night-coverage [email or web address] Password [email or web address]

## 2019-09-05 LAB — CULTURE, BLOOD (ROUTINE X 2)
Culture: NO GROWTH
Special Requests: ADEQUATE

## 2019-09-06 LAB — CULTURE, BLOOD (ROUTINE X 2): Culture: NO GROWTH

## 2019-09-16 ENCOUNTER — Other Ambulatory Visit: Payer: Self-pay

## 2019-09-16 ENCOUNTER — Ambulatory Visit: Payer: Medicare Other | Admitting: Pulmonary Disease

## 2019-09-16 ENCOUNTER — Encounter: Payer: Self-pay | Admitting: Pulmonary Disease

## 2019-09-16 VITALS — BP 142/68 | HR 72 | Temp 97.2°F | Ht 66.0 in | Wt 237.2 lb

## 2019-09-16 DIAGNOSIS — R0600 Dyspnea, unspecified: Secondary | ICD-10-CM

## 2019-09-16 DIAGNOSIS — R0683 Snoring: Secondary | ICD-10-CM | POA: Diagnosis not present

## 2019-09-16 DIAGNOSIS — Z87891 Personal history of nicotine dependence: Secondary | ICD-10-CM

## 2019-09-16 DIAGNOSIS — R0609 Other forms of dyspnea: Secondary | ICD-10-CM

## 2019-09-16 DIAGNOSIS — R5381 Other malaise: Secondary | ICD-10-CM | POA: Diagnosis not present

## 2019-09-16 NOTE — Patient Instructions (Signed)
Will arrange for pulmonary function test  Will arrange for home sleep study  Will call to arrange for follow up after sleep study reviewed

## 2019-09-16 NOTE — Progress Notes (Signed)
[facility]  Chief Complaint  Patient presents with  . Pulmonary Consult    Constitutional:  BP (!) 142/68 (BP Location: Right Arm, Cuff Size: Large)   Pulse 72   Temp (!) 97.2 F (36.2 C) (Oral)   Ht 5\' 6"  (1.676 m)   Wt 237 lb 3.2 oz (107.6 kg)   SpO2 99%   BMI 38.29 kg/m   Past Medical History:  HTN, HLD, Hypothyroidism, CAD, AAA  Brief Summary:  Bryan Carter is a 78 y.o. male former smoker with dyspnea and snoring.  I saw him previous in 2012.  At that time assessment was for sleep apnea.  Doesn't appear he had sleep study.  He is followed by Dr. Terrence Dupont for CAD.  His wife has been concerned about his breathing and sleep.  She arranged for appointment today.  He gets winded after walking 200 feet.  Can go further if he has something to help support him while walking.  Doesn't really go up stairs.  Has wheezing episodes, especially at night.  Occasionally has a cough, but not much phlegm.  Not having chest pain or palpitations.  Has chronic ankle swelling Rt > Lt.  No history of thromboembolic disease, pneumonia, or TB.  Smoked 1.5 packs per day, started at age 59 and quit at age 64.  No recent breathing tests.  Never been told he had asthma or COPD.  Was prescribed ventolin and this helps some.  He snores.  His wife has to sleep in separate bedroom.  He wakes up frequently to use the bathroom.  Feels tired and sleepy all day long.  CXR from 08/31/19 (reviewed by me) was normal.     Physical Exam:   Appearance - well kempt   ENMT - clear nasal mucosa, midline nasal  septum, no oral exudates, no LAN, trachea midline  Respiratory - normal chest wall, normal respiratory effort, no accessory muscle use, no wheeze/rales  CV - s1s2 regular rate and rhythm, no murmurs, no peripheral edema, radial pulses symmetric  GI - soft, non tender, no masses  Lymph - no adenopathy noted in neck and axillary areas  MSK - normal gait  Ext -  no cyanosis, clubbing, or joint inflammation noted  Skin - no rashes, lesions, or ulcers  Neuro - normal strength, oriented x 3  Psych - normal mood and affect   CBC Latest Ref Rng & Units 09/02/2019 09/01/2019 08/31/2019  WBC 4.0 - 10.5 K/uL 12.1(H) 18.1(H) 17.3(H)  Hemoglobin 13.0 - 17.0 g/dL 10.6(L) 10.1(L) 11.0(L)  Hematocrit 39.0 - 52.0 % 33.0(L) 31.1(L) 34.1(L)  Platelets 150 - 400 K/uL 171 166 192    CMP Latest Ref Rng & Units 09/03/2019 09/02/2019 09/01/2019  Glucose 70 - 99 mg/dL 103(H) 103(H) 105(H)  BUN 8 - 23 mg/dL 17 23 31(H)  Creatinine 0.61 - 1.24 mg/dL 1.21 1.47(H) 1.68(H)  Sodium 135 - 145 mmol/L 140 138 137  Potassium 3.5 - 5.1 mmol/L 3.8 3.9 3.7  Chloride 98 - 111 mmol/L 114(H) 113(H) 109  CO2 22 - 32 mmol/L 20(L) 18(L) 19(L)  Calcium 8.9 - 10.3 mg/dL 9.3 8.7(L) 9.0  Total Protein 6.5 - 8.1 g/dL - 6.1(L) 6.6  Total Bilirubin 0.3 - 1.2 mg/dL - 0.4 0.5  Alkaline Phos 38 - 126 U/L - 64 73  AST 15 - 41 U/L - 46(H) 49(H)  ALT 0 - 44 U/L - 25 21      Assessment/Plan:   Dyspnea on  exertion. - has history of smoking and reports some improvement with albuterol; will arrange for PFT to assess for COPD - has history of CAD and followed by Dr. Sharyn Lull - has mild anemia, but not sure this is enough by itself to explain his dyspnea; f/u with PCP for this - obesity with deconditioning; he is not very active due to dyspnea and back/hip pain  Snoring. - he has sleep disruption, witnessed apnea, and daytime sleepiness - he has history of hypertension and coronary artery disease - I am concerned he could have obstructive sleep apnea - will arrange for home sleep study to further assess   Patient Instructions  Will arrange for pulmonary function test  Will arrange for home sleep study  Will call to arrange for follow up after sleep study reviewed    Coralyn Helling, MD [facility] Pager: [phone] 09/16/2019, 10:57 AM  Flow  Sheet     Pulmonary tests:    Sleep tests:    Cardiac tests:  Echo 08/06/16 >> EF 50 to 55%, grade 1   Review of systems:  Urine incontinence.  Ankle swelling.  Back pain.  Hip pain.  Remainder reviewed and negative.   Medications:   Allergies as of 09/16/2019      Reactions   Quinapril Shortness Of Breath, Cough      Medication List       Accurate as of September 16, 2019 10:57 AM. If you have any questions, ask your nurse or doctor.        albuterol 108 (90 Base) MCG/ACT inhaler Commonly known as: VENTOLIN HFA Inhale 2 puffs into the lungs every 6 (six) hours as needed for wheezing or shortness of breath.   amLODipine 5 MG tablet Commonly known as: NORVASC Take 5 mg by mouth daily.   benzonatate 100 MG capsule Commonly known as: TESSALON Take by mouth 3 (three) times daily as needed for cough.   carvedilol 25 MG tablet Commonly known as: COREG Take 25 mg by mouth 2 (two) times daily.   Cyanocobalamin 5000 MCG Tbdp Take 5,000 mcg by mouth daily.   finasteride 5 MG tablet Commonly known as: PROSCAR Take 5 mg by mouth at bedtime.   FISH OIL PO Take 1 capsule by mouth 2 (two) times daily.   furosemide 40 MG tablet Commonly known as: LASIX Take 40 mg by mouth daily.   levothyroxine 75 MCG tablet Commonly known as: SYNTHROID Take 75 mcg by mouth daily before breakfast.   losartan 100 MG tablet Commonly known as: COZAAR Take 100 mg by mouth daily.   multivitamin tablet Take 1 tablet daily by mouth.   nitroGLYCERIN 0.4 MG SL tablet Commonly known as: Nitrostat Place 1 tablet (0.4 mg total) under the tongue every 5 (five) minutes as needed for chest pain.   spironolactone 25 MG tablet Commonly known as: ALDACTONE Take 1 tablet (25 mg total) by mouth daily.   tamsulosin 0.4 MG Caps capsule Commonly known as: FLOMAX Take 0.4 mg by mouth at bedtime.   ticagrelor 90 MG Tabs tablet Commonly known as: BRILINTA Take 1 tablet (90 mg total) by  mouth 2 (two) times daily.       Past Surgical History:  He  has a past surgical history that includes Colonoscopy (2006); Colon surgery (2006); Abdominal exploration surgery (~ 2006); Cataract extraction w/ intraocular lens  implant, bilateral (Bilateral); CORONARY BALLOON ANGIOPLASTY (N/A, 11/12/2017); CORONARY ANGIOGRAPHY (N/A, 11/12/2017); Hernia repair (~ 2006); Cardiac catheterization (N/A, 10/16/2016); Cardiac catheterization (N/A,  10/16/2016); Coronary angioplasty; and Coronary angioplasty with stent (2008).  Family History:  His family history includes Heart attack in his father and mother; Heart disease in his father and mother.  Social History:  He  reports that he quit smoking about 12 years ago. His smoking use included cigarettes. He has a 50.00 pack-year smoking history. He has never used smokeless tobacco. He reports current alcohol use. He reports current drug use. Drug: GHB.

## 2019-09-26 ENCOUNTER — Inpatient Hospital Stay (HOSPITAL_COMMUNITY): Admission: RE | Admit: 2019-09-26 | Payer: Medicare HMO | Source: Ambulatory Visit

## 2019-09-26 ENCOUNTER — Encounter (HOSPITAL_COMMUNITY): Payer: Medicare HMO

## 2019-09-26 ENCOUNTER — Ambulatory Visit: Payer: Medicare HMO | Admitting: Surgery

## 2019-11-01 ENCOUNTER — Ambulatory Visit: Payer: Medicare Other

## 2019-11-01 ENCOUNTER — Other Ambulatory Visit: Payer: Self-pay

## 2019-11-01 DIAGNOSIS — R0683 Snoring: Secondary | ICD-10-CM

## 2019-11-04 ENCOUNTER — Telehealth (HOSPITAL_COMMUNITY): Payer: Self-pay

## 2019-11-04 NOTE — Telephone Encounter (Signed)

## 2019-11-06 ENCOUNTER — Other Ambulatory Visit: Payer: Self-pay

## 2019-11-06 DIAGNOSIS — I779 Disorder of arteries and arterioles, unspecified: Secondary | ICD-10-CM

## 2019-11-06 DIAGNOSIS — I714 Abdominal aortic aneurysm, without rupture, unspecified: Secondary | ICD-10-CM

## 2019-11-07 ENCOUNTER — Other Ambulatory Visit: Payer: Self-pay

## 2019-11-07 ENCOUNTER — Ambulatory Visit (INDEPENDENT_AMBULATORY_CARE_PROVIDER_SITE_OTHER): Payer: Medicare Other | Admitting: Surgery

## 2019-11-07 ENCOUNTER — Encounter: Payer: Self-pay | Admitting: Surgery

## 2019-11-07 ENCOUNTER — Ambulatory Visit (HOSPITAL_COMMUNITY)
Admission: RE | Admit: 2019-11-07 | Discharge: 2019-11-07 | Disposition: A | Discharge status: Home / Self Care | Payer: Medicare Other | Source: Ambulatory Visit | Attending: Surgery | Admitting: Surgery

## 2019-11-07 ENCOUNTER — Telehealth: Payer: Self-pay | Admitting: Pulmonary Disease

## 2019-11-07 ENCOUNTER — Ambulatory Visit (INDEPENDENT_AMBULATORY_CARE_PROVIDER_SITE_OTHER)
Admission: RE | Admit: 2019-11-07 | Discharge: 2019-11-07 | Disposition: A | Discharge status: Home / Self Care | Payer: Medicare Other | Source: Ambulatory Visit | Attending: Surgery | Admitting: Surgery

## 2019-11-07 VITALS — BP 143/86 | HR 69 | Temp 97.6°F | Resp 20 | Ht 66.0 in | Wt 234.0 lb

## 2019-11-07 DIAGNOSIS — I714 Abdominal aortic aneurysm, without rupture, unspecified: Secondary | ICD-10-CM

## 2019-11-07 DIAGNOSIS — I779 Disorder of arteries and arterioles, unspecified: Secondary | ICD-10-CM | POA: Diagnosis not present

## 2019-11-07 NOTE — Telephone Encounter (Signed)
Home sleep study from 11/01/19 only had 2.6 minutes of recorded data.  Please arrange to have home sleep study repeated.

## 2019-11-07 NOTE — Telephone Encounter (Signed)
I have reached out to the patient to r/s this HST.

## 2019-11-07 NOTE — Progress Notes (Signed)
[facility]  Patient name: Bryan Carter MRN: [id number] DOB: [birth year] Sex: male   REASON FOR VISIT:    Follow up  HISOTRY OF PRESENT ILLNESS:    Bryan Carter is a 79 y.o. male who returns today for follow-up of his abdominal aortic aneurysm.  A CT scan in 2020 showed this to measure 4.4 cm.  He denies any abdominal pain or back pain.  The patient has a history of prostate cancer. He has a history of undergoing a colonoscopy complicated by perforation requiring colon resection. This was done through a midline incision. He also developed a hernia and has had a incisional hernia performed with mesh.  The patient has a history of coronary artery disease. He has had a heart attack and has undergone coronary stenting 2. He is medically managed for hypercholesterolemia with a statin. He quit smoking approximately 8 years ago when he had his heart attack  PAST MEDICAL HISTORY:   Past Medical History:  Diagnosis Date  . AAA (abdominal aortic aneurysm) ([place])   . Arthritis    "joints, back" (11/12/2017)  . Coronary artery disease   . Hyperlipidemia   . Hypertension   . Hypothyroidism   . Myocardial infarction [facility]) 2008     FAMILY HISTORY:   Family History  Problem Relation Age of Onset  . Heart attack Mother   . Heart disease Mother   . Heart attack Father   . Heart disease Father     SOCIAL HISTORY:   Social History   Tobacco Use  . Smoking status: Former Smoker    Packs/day: 1.00    Years: 50.00    Pack years: 50.00    Types: Cigarettes    Quit date: 10/06/2006    Years since quitting: 13.0  . Smokeless tobacco: Never Used  Substance Use Topics  . Alcohol use: Yes    Alcohol/week: 0.0 standard drinks    Comment: 11/12/2017  "might drink a beer once/year"     ALLERGIES:   Allergies  Allergen Reactions  . Quinapril Shortness Of Breath and Cough     CURRENT MEDICATIONS:   Current  Outpatient Medications  Medication Sig Dispense Refill  . albuterol (VENTOLIN HFA) 108 (90 Base) MCG/ACT inhaler Inhale 2 puffs into the lungs every 6 (six) hours as needed for wheezing or shortness of breath.    Marland Kitchen amLODipine (NORVASC) 5 MG tablet Take 5 mg by mouth daily.    . carvedilol (COREG) 25 MG tablet Take 25 mg by mouth 2 (two) times daily.    . Cyanocobalamin 5000 MCG TBDP Take 5,000 mcg by mouth daily.    . finasteride (PROSCAR) 5 MG tablet Take 5 mg by mouth at bedtime.     . furosemide (LASIX) 40 MG tablet Take 40 mg by mouth daily.    Marland Kitchen levothyroxine (SYNTHROID, LEVOTHROID) 75 MCG tablet Take 75 mcg by mouth daily before breakfast.    . losartan (COZAAR) 100 MG tablet Take 100 mg by mouth daily.    . Multiple Vitamin (MULTIVITAMIN) tablet Take 1 tablet daily by mouth.    . Omega-3 Fatty Acids (FISH OIL PO) Take 1 capsule by mouth 2 (two) times daily.     Marland Kitchen spironolactone (ALDACTONE) 25 MG tablet Take 1 tablet (25 mg total) by mouth daily. 30 tablet 0  . tamsulosin (FLOMAX) 0.4 MG CAPS capsule Take 0.4 mg by mouth at bedtime.     . ticagrelor (BRILINTA) 90 MG TABS tablet  Take 1 tablet (90 mg total) by mouth 2 (two) times daily. 60 tablet 3  . benzonatate (TESSALON) 100 MG capsule Take by mouth 3 (three) times daily as needed for cough.    . nitroGLYCERIN (NITROSTAT) 0.4 MG SL tablet Place 1 tablet (0.4 mg total) under the tongue every 5 (five) minutes as needed for chest pain. 100 tablet 3   No current facility-administered medications for this visit.    REVIEW OF SYSTEMS:   [X]  denotes positive finding, [ ]  denotes negative finding Cardiac  Comments:  Chest pain or chest pressure:    Shortness of breath upon exertion:    Short of breath when lying flat:    Irregular heart rhythm:        Vascular    Pain in calf, thigh, or hip brought on by ambulation:    Pain in feet at night that wakes you up from your sleep:     Blood clot in your veins:    Leg swelling:          Pulmonary    Oxygen at home:    Productive cough:     Wheezing:         Neurologic    Sudden weakness in arms or legs:     Sudden numbness in arms or legs:     Sudden onset of difficulty speaking or slurred speech:    Temporary loss of vision in one eye:     Problems with dizziness:         Gastrointestinal    Blood in stool:     Vomited blood:         Genitourinary    Burning when urinating:     Blood in urine:        Psychiatric    Major depression:         Hematologic    Bleeding problems:    Problems with blood clotting too easily:        Skin    Rashes or ulcers:        Constitutional    Fever or chills:      PHYSICAL EXAM:   Vitals:   11/07/19 0954  BP: (!) 143/86  Pulse: 69  Resp: 20  Temp: 97.6 F (36.4 C)  SpO2: 99%  Weight: 234 lb (106.1 kg)  Height: 5\' 6"  (1.676 m)    GENERAL: The patient is a well-nourished male, in no acute distress. The vital signs are documented above. CARDIAC: There is a regular rate and rhythm.  VASCULAR: non-palpable pedal pulses PULMONARY: Non-labored respirations ABDOMEN: Soft and non-tender with normal pitched bowel sounds.  MUSCULOSKELETAL: There are no major deformities or cyanosis. NEUROLOGIC: No focal weakness or paresthesias are detected. SKIN: There are no ulcers or rashes noted. PSYCHIATRIC: The patient has a normal affect.  STUDIES:   I have ordered and reviewed the following: +-------+-----------+-----------+------------+------------+  ABI/TBIToday's ABIToday's TBIPrevious ABIPrevious TBI  +-------+-----------+-----------+------------+------------+  Right 0.72    0.44    0.78    0.43      +-------+-----------+-----------+------------+------------+  Left  0.55    0.38    0.70    0.49      +-------+-----------+-----------+------------+------------+    AAA:  Abdominal Aorta: There is evidence of abnormal dilatation of the mid and  distal Abdominal  aorta. There is evidence of abnormal dilation of the  Right Common Iliac artery. The largest aortic measurement is 4.6 cm. The  largest aortic diameter has increased  compared to prior  exam. Previous diameter measurement was 4.2 cm obtained   MEDICAL ISSUES:   AAA: Maximum aortic diameter is now 4.6 cm.  He will return for surveillance imaging in 6 months  PAD:  ABI's are decreased however he does not have symptoms of claudication.  We discussed these results and for him to monitor his feet daily and to contact me for non-healing wounds.   Charlena Cross, MD, FACS [facility] [phone] Pager [phone]

## 2019-11-08 ENCOUNTER — Other Ambulatory Visit: Payer: Self-pay | Admitting: *Deleted

## 2019-11-08 DIAGNOSIS — I714 Abdominal aortic aneurysm, without rupture, unspecified: Secondary | ICD-10-CM

## 2019-11-08 NOTE — Telephone Encounter (Signed)
Pt's HST is r/s to 2/3 @ 2:30.

## 2019-11-09 ENCOUNTER — Other Ambulatory Visit: Payer: Self-pay

## 2019-11-09 ENCOUNTER — Ambulatory Visit: Payer: Medicare Other

## 2019-11-09 DIAGNOSIS — G4733 Obstructive sleep apnea (adult) (pediatric): Secondary | ICD-10-CM

## 2019-11-11 ENCOUNTER — Other Ambulatory Visit (HOSPITAL_COMMUNITY)
Admission: RE | Admit: 2019-11-11 | Discharge: 2019-11-11 | Disposition: A | Discharge status: Home / Self Care | Payer: Medicare Other | Source: Ambulatory Visit | Attending: Pulmonary Disease | Admitting: Pulmonary Disease

## 2019-11-11 ENCOUNTER — Telehealth: Payer: Self-pay | Admitting: Pulmonary Disease

## 2019-11-11 DIAGNOSIS — Z20822 Contact with and (suspected) exposure to covid-19: Secondary | ICD-10-CM | POA: Insufficient documentation

## 2019-11-11 DIAGNOSIS — Z01812 Encounter for preprocedural laboratory examination: Secondary | ICD-10-CM | POA: Diagnosis present

## 2019-11-11 DIAGNOSIS — G4733 Obstructive sleep apnea (adult) (pediatric): Secondary | ICD-10-CM

## 2019-11-11 LAB — SARS CORONAVIRUS 2 (TAT 6-24 HRS): SARS Coronavirus 2: NEGATIVE

## 2019-11-11 NOTE — Telephone Encounter (Signed)
HST 11/09/19 >> AHI 42.6, SpO2 low 80%.   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  Please arrange for ROV with me or NP to discuss treatment options.

## 2019-11-13 NOTE — Progress Notes (Signed)
Virtual Visit via Telephone Note  Unable to connect with Carlynn Spry on 11/15/19 at  2:00 PM EST by telephone  Location: Patient: Home Provider: Office [facility] - [address] [place], [address], [place], [place] [address]   I discussed the limitations, risks, security and privacy concerns of performing an evaluation and management service by telephone and the availability of in person appointments. I also discussed with the patient that there may be a patient responsible charge related to this service. The patient expressed understanding and agreed to proceed.  Patient consented to consult via telephone: Yes People present and their role in pt care: Pt   History of Present Illness:  79 year old male former smoker followed in our office for severe obstructive sleep apnea and dyspnea  Past medical history: Hyperlipidemia, hypertension, hypersomnia, obesity Smoking history: Former smoker.  Quit 2008.  50-pack-year smoking history Maintenance: Patient of Dr. Craige Cotta  Chief complaint: Review HST and PFT results     Attempted to reach the patient with the patient was unavailable.  Spouse was available to talk with spouse does not have a signed form allowing [place] to give PHI information directly to her.  Appointment rescheduled for 11/15/2019.   Observations/Objective:  Echo 08/06/16 >> EF 50 to 55%, grade 1   HST 11/09/19 >> AHI 42.6, SpO2 low 80%  11/14/2019-FVC 2.98 (92% predicted), postbronchodilator ratio 83, postbronchodilator FEV1 2.33 (99% predicted), no bronchodilator response, DLCO 16 (71% predicted)  Social History   Tobacco Use  Smoking Status Former Smoker  . Packs/day: 1.00  . Years: 50.00  . Pack years: 50.00  . Types: Cigarettes  . Quit date: 10/06/2006  . Years since quitting: 13.1  Smokeless Tobacco Never Used   Immunization History  Administered Date(s) Administered  . Influenza, High Dose Seasonal PF 06/06/2019  . Zoster Recombinat (Shingrix) 08/10/2019,  08/15/2019    Assessment and Plan:  No problem-specific Assessment & Plan notes found for this encounter.   Follow Up Instructions:  Return if symptoms worsen or fail to improve.   I discussed the assessment and treatment plan with the patient. The patient was provided an opportunity to ask questions and all were answered. The patient agreed with the plan and demonstrated an understanding of the instructions.   The patient was advised to call back or seek an in-person evaluation if the symptoms worsen or if the condition fails to improve as anticipated.  I provided 0 minutes of non-face-to-face time during this encounter.   Coral Ceo, NP

## 2019-11-14 ENCOUNTER — Ambulatory Visit (INDEPENDENT_AMBULATORY_CARE_PROVIDER_SITE_OTHER): Payer: Medicare Other | Admitting: Pulmonary Disease

## 2019-11-14 ENCOUNTER — Encounter: Payer: Self-pay | Admitting: Pulmonary Disease

## 2019-11-14 ENCOUNTER — Other Ambulatory Visit: Payer: Self-pay

## 2019-11-14 DIAGNOSIS — G4733 Obstructive sleep apnea (adult) (pediatric): Secondary | ICD-10-CM

## 2019-11-14 DIAGNOSIS — R0600 Dyspnea, unspecified: Secondary | ICD-10-CM

## 2019-11-14 DIAGNOSIS — R0609 Other forms of dyspnea: Secondary | ICD-10-CM

## 2019-11-14 LAB — PULMONARY FUNCTION TEST
DL/VA % pred: 110 %
DL/VA: 4.38 ml/min/mmHg/L
DLCO unc % pred: 71 %
DLCO unc: 16 ml/min/mmHg
FEF 25-75 Post: 2.22 L/sec
FEF 25-75 Pre: 2.6 L/sec
FEF2575-%Change-Post: -14 %
FEF2575-%Pred-Post: 121 %
FEF2575-%Pred-Pre: 141 %
FEV1-%Change-Post: -4 %
FEV1-%Pred-Post: 99 %
FEV1-%Pred-Pre: 104 %
FEV1-Post: 2.33 L
FEV1-Pre: 2.44 L
FEV1FVC-%Change-Post: 1 %
FEV1FVC-%Pred-Pre: 109 %
FEV6-%Change-Post: -5 %
FEV6-%Pred-Post: 92 %
FEV6-%Pred-Pre: 98 %
FEV6-Post: 2.81 L
FEV6-Pre: 2.98 L
FEV6FVC-%Pred-Post: 106 %
FEV6FVC-%Pred-Pre: 106 %
FVC-%Change-Post: -5 %
FVC-%Pred-Post: 87 %
FVC-%Pred-Pre: 92 %
FVC-Post: 2.81 L
FVC-Pre: 2.98 L
Post FEV1/FVC ratio: 83 %
Post FEV6/FVC ratio: 100 %
Pre FEV1/FVC ratio: 82 %
Pre FEV6/FVC Ratio: 100 %
RV % pred: 101 %
RV: 2.5 L
TLC % pred: 80 %
TLC: 5.19 L

## 2019-11-14 NOTE — Telephone Encounter (Signed)
Patient already scheduled for OV with Dr. Craige Cotta on 11/30/19.  Called the patient and made him aware of the results and confirmed the appointment scheduled. He asked for his cell # [phone] to be listed as his main contact cell #. The number [phone] was his wife's cell #.  Made him aware of the sleep study results. Nothing further needed at this time.

## 2019-11-14 NOTE — Progress Notes (Signed)
Full PFT performed today. °

## 2019-11-14 NOTE — Progress Notes (Signed)
Virtual Visit via Telephone Note  I connected with Bryan Carter on 11/15/19 at 11:30 AM EST by telephone and verified that I am speaking with the correct person using two identifiers.  Location: Patient: Home Provider: Office [facility] - [address], [place], [place], [place] [address]   I discussed the limitations, risks, security and privacy concerns of performing an evaluation and management service by telephone and the availability of in person appointments. I also discussed with the patient that there may be a patient responsible charge related to this service. The patient expressed understanding and agreed to proceed.  Patient consented to consult via telephone: Yes People present and their role in pt care: Pt   History of Present Illness:  79 year old male former smoker followed in our office for severe obstructive sleep apnea and dyspnea  Past medical history: Hyperlipidemia, hypertension, hypersomnia, obesity Smoking history: Former smoker.  Quit 2008.  50-pack-year smoking history Maintenance: none  Patient of Dr. Halford Chessman  Chief complaint: Review HST and PFT results   79 year old male former smoker last seen in our office in December/2020 for consult regarding dyspnea on exertion.  Patient is a former smoker quit in 2008 with a 50-pack-year smoking history.  Patient has recently completed a home sleep study had a suspicion for sleep apnea as well as snoring as well as a pulmonary function test to further evaluate his dyspnea.  Those results are listed below:  HST 11/09/19 >> AHI 42.6, SpO2 low 80%  11/14/2019-pft - FVC 2.98 (92% predicted), postbronchodilator ratio 83, postbronchodilator FEV1 2.33 (99% predicted), no bronchodilator response, DLCO 16 (71% predicted)  We will review these results with the patient today.  Patient spouse Lyndee Leo is also present on the telephone call.   Observations/Objective:  Echo 08/06/16 >> EF 50 to 55%, grade 1   HST 11/09/19 >>  AHI 42.6, SpO2 low 80%  11/14/2019-pft - FVC 2.98 (92% predicted), postbronchodilator ratio 83, postbronchodilator FEV1 2.33 (99% predicted), no bronchodilator response, DLCO 16 (71% predicted)  Social History   Tobacco Use  Smoking Status Former Smoker  . Packs/day: 1.00  . Years: 50.00  . Pack years: 50.00  . Types: Cigarettes  . Quit date: 10/06/2006  . Years since quitting: 13.1  Smokeless Tobacco Never Used   Immunization History  Administered Date(s) Administered  . Influenza, High Dose Seasonal PF 06/06/2019  . Zoster Recombinat (Shingrix) 08/10/2019, 08/15/2019    Assessment and Plan:  OSA (obstructive sleep apnea) Plan: Patient would like to start CPAP therapy We will place order to DME company, [employer] APAP setting 5-15 Mask of choice Follow-up with our office in 2 months to show 30-day compliance of using CPAP  DOE (dyspnea on exertion) Reviewed pulmonary function test with patient and spouse today Reported that we do not see a formal obstruction There is not a bronchodilator response There is a reduction in DLCO, this could be directly related the patient's smoking history and potential emphysema  Plan: We will continue to clinically monitor Offered trial of Spiriva Respimat inhaler, patient declined Patient would like to consider this at next follow-up visit in 2 months Encourage patient to continue working on increasing his overall physical activity as he admits he has been quite sedentary over the COVID-19 pandemic  Follow Up Instructions:  Return in about 2 months (around 01/13/2020), or if symptoms worsen or fail to improve, for Follow up with Dr. Halford Chessman.   I discussed the assessment and treatment plan with the patient. The patient  was provided an opportunity to ask questions and all were answered. The patient agreed with the plan and demonstrated an understanding of the instructions.   The patient was advised to call back or seek an in-person evaluation if  the symptoms worsen or if the condition fails to improve as anticipated.  I provided 24 minutes of non-face-to-face time during this encounter.   Coral Ceo, NP

## 2019-11-15 ENCOUNTER — Ambulatory Visit (INDEPENDENT_AMBULATORY_CARE_PROVIDER_SITE_OTHER): Payer: Medicare Other | Admitting: Pulmonary Disease

## 2019-11-15 ENCOUNTER — Encounter: Payer: Self-pay | Admitting: Pulmonary Disease

## 2019-11-15 DIAGNOSIS — G4733 Obstructive sleep apnea (adult) (pediatric): Secondary | ICD-10-CM | POA: Insufficient documentation

## 2019-11-15 DIAGNOSIS — R0600 Dyspnea, unspecified: Secondary | ICD-10-CM

## 2019-11-15 DIAGNOSIS — R0609 Other forms of dyspnea: Secondary | ICD-10-CM

## 2019-11-15 NOTE — Assessment & Plan Note (Signed)
Plan: Patient would like to start CPAP therapy We will place order to DME company, [employer] APAP setting 5-15 Mask of choice Follow-up with our office in 2 months to show 30-day compliance of using CPAP

## 2019-11-15 NOTE — Addendum Note (Signed)
Addended by: Maurene Capes on: 11/15/2019 12:26 PM   Modules accepted: Orders

## 2019-11-15 NOTE — Progress Notes (Signed)
Reviewed and agree with assessment/plan.   Holman Bonsignore, MD [facility] 10/01/2016, 12:24 PM Pager:  [phone]  

## 2019-11-15 NOTE — Patient Instructions (Addendum)
You were seen today by Coral Ceo, NP  for:   1. OSA (obstructive sleep apnea)  New CPAP order DME: [employer] APAP setting 5-15 Mask of choice   We recommend that you continue using your CPAP daily >>>Keep up the hard work using your device >>> Goal should be wearing this for the entire night that you are sleeping, at least 4 to 6 hours  Remember:  . Do not drive or operate heavy machinery if tired or drowsy.  . Please notify the supply company and office if you are unable to use your device regularly due to missing supplies or machine being broken.  . Work on maintaining a healthy weight and following your recommended nutrition plan  . Maintain proper daily exercise and movement  . Maintaining proper use of your device can also help improve management of other chronic illnesses such as: Blood pressure, blood sugars, and weight management.   BiPAP/ CPAP Cleaning:  >>>Clean weekly, with Dawn soap, and bottle brush.  Set up to air dry. >>> Wipe mask out daily with wet wipe or towelette   2. DOE (dyspnea on exertion)  We offered to a trial of Spiriva Respimat, you declined this  We can further evaluate this in 2 months at your next office visit in person with a walk  Work on increasing your overall physical activity consistently each day   Follow Up:    Return in about 2 months (around 01/13/2020), or if symptoms worsen or fail to improve, for Follow up with Dr. Craige Cotta.   Please do your part to reduce the spread of COVID-19:      Reduce your risk of any infection  and COVID19 by using the similar precautions used for avoiding the common cold or flu:  Marland Kitchen Wash your hands often with soap and warm water for at least 20 seconds.  If soap and water are not readily available, use an alcohol-based hand sanitizer with at least 60% alcohol.  . If coughing or sneezing, cover your mouth and nose by coughing or sneezing into the elbow areas of your shirt or coat, into a tissue or into your  sleeve (not your hands). Drinda Butts A MASK when in public  . Avoid shaking hands with others and consider head nods or verbal greetings only. . Avoid touching your eyes, nose, or mouth with unwashed hands.  . Avoid close contact with people who are sick. . Avoid places or events with large numbers of people in one location, like concerts or sporting events. . If you have some symptoms but not all symptoms, continue to monitor at home and seek medical attention if your symptoms worsen. . If you are having a medical emergency, call 911.   ADDITIONAL HEALTHCARE OPTIONS FOR PATIENTS  [facility] / e-Visit: [email or web address]         [facility]: [phone]  [facility]: [phone]                   [facility]: [phone]     It is flu season:   >>> Best ways to protect herself from the flu: Receive the yearly flu vaccine, practice good hand hygiene washing with soap and also using hand sanitizer when available, eat a nutritious meals, get adequate rest, hydrate appropriately   Please contact the office if your symptoms worsen or you have concerns that you are not improving.   Thank you for choosing [facility] for  your healthcare, and for allowing [place] to partner with you on your healthcare journey. I am thankful to be able to provide care to you today.   Wyn Quaker FNP-C

## 2019-11-15 NOTE — Assessment & Plan Note (Signed)
Reviewed pulmonary function test with patient and spouse today Reported that we do not see a formal obstruction There is not a bronchodilator response There is a reduction in DLCO, this could be directly related the patient's smoking history and potential emphysema  Plan: We will continue to clinically monitor Offered trial of Spiriva Respimat inhaler, patient declined Patient would like to consider this at next follow-up visit in 2 months Encourage patient to continue working on increasing his overall physical activity as he admits he has been quite sedentary over the COVID-19 pandemic

## 2019-11-30 ENCOUNTER — Ambulatory Visit: Payer: Medicare Other | Admitting: Pulmonary Disease

## 2019-12-01 ENCOUNTER — Telehealth: Payer: Self-pay | Admitting: Pulmonary Disease

## 2019-12-01 NOTE — Telephone Encounter (Signed)
Called and spoke with Patient's Wife, Clara.  Clara stated Patient was not answering his unknown calls.  Clara stated [employer] had called 15 minutes earlier, and she spoke with them. Nothing further at this time.

## 2020-01-25 ENCOUNTER — Ambulatory Visit: Payer: Medicare Other | Admitting: Pulmonary Disease

## 2020-01-25 ENCOUNTER — Encounter: Payer: Self-pay | Admitting: Pulmonary Disease

## 2020-01-25 ENCOUNTER — Other Ambulatory Visit: Payer: Self-pay

## 2020-01-25 VITALS — BP 120/84 | HR 66 | Temp 97.2°F | Ht 66.0 in | Wt 235.4 lb

## 2020-01-25 DIAGNOSIS — Z9989 Dependence on other enabling machines and devices: Secondary | ICD-10-CM

## 2020-01-25 DIAGNOSIS — G4733 Obstructive sleep apnea (adult) (pediatric): Secondary | ICD-10-CM

## 2020-01-25 NOTE — Patient Instructions (Signed)
Will have [employer] change your auto CPAP setting to 5 - 10 cm water and arrange for new CPAP supplies  Call if you are having trouble after CPAP pressure changed  Follow up in 1 year

## 2020-01-25 NOTE — Progress Notes (Signed)
[facility]  Chief Complaint  Patient presents with  . Follow-up    2 month f/u after cpap start. States the cpap machine is working, denies any issues.     Constitutional:  BP 120/84   Pulse 66   Temp (!) 97.2 F (36.2 C) (Temporal)   Ht 5\' 6"  (1.676 m)   Wt 235 lb 6.4 oz (106.8 kg)   SpO2 97% Comment: on RA  BMI 37.99 kg/m   Past Medical History:  HTN, HLD, Hypothyroidism, CAD, AAA  Brief Summary:  Bryan Carter is a 79 y.o. male former smoker with obstructive sleep apnea.  Subjective:   Doing well with CPAP.  Has full face mask.  Gets mask leak at times, and this wakes him up.  Goes to bed at 1030 pm.  Wakes up at 430 am to use bathroom.  Doesn't always put CPAP back on.  Naps occasionally for 15 minutes up to couple hours.  Not having sinus congestion, sore throat, dry mouth, or aerophagia.  Physical Exam:   Appearance - well kempt   ENMT - no sinus tenderness, no oral exudate, no LAN, Mallampati 3 airway, no stridor, wears dentures  Respiratory - equal breath sounds bilaterally, no wheezing or rales  CV - s1s2 regular rate and rhythm, no murmurs  Ext - no clubbing, no edema  Skin - no rashes  Psych - normal mood and affect  Assessment/Plan:   Obstructive sleep apnea. - he is compliant with therapy and reports benefit - advised him to use CPAP for entire time he is asleep, including during naps - will narrow his auto CPAP to 5 - 10 cm H2O and see if this improves his trouble with mask leak - will have his DME arrange for new CPAP supplies  Follow up:   Patient Instructions  Will have [employer] change your auto CPAP setting to 5 - 10 cm water and arrange for new CPAP supplies  Call if you are having trouble after CPAP pressure changed  Follow up in 1 year  Signature:  Chesley Mires, MD [facility] Pager: [phone] 01/25/2020, 10:48 AM  Flow Sheet     Pulmonary tests:  PFT 11/14/19 >>  FEV1 2.44 (104%), FEV1% 82, TLC 5.19 (80%), DLCO 71%, no BD  Sleep tests:  HST 11/09/19 >> AHI 42.6, SpO2 low 80%. Auto CPAP 12/25/19 to 01/23/20 >> used on 30 of 30 nights with average 7 hrs 27 min.  Average AHI 3.7 with median CPAP 9 and 95 th percentile CPAP 12 cm H2O.  Air leak.  Cardiac tests:  Echo 08/06/16 >> EF 50 to 55%, grade 1   Medications:   Allergies as of 01/25/2020      Reactions   Quinapril Shortness Of Breath, Cough      Medication List       Accurate as of January 25, 2020 10:48 AM. If you have any questions, ask your nurse or doctor.        albuterol 108 (90 Base) MCG/ACT inhaler Commonly known as: VENTOLIN HFA Inhale 2 puffs into the lungs every 6 (six) hours as needed for wheezing or shortness of breath.   amLODipine 5 MG tablet Commonly known as: NORVASC Take 5 mg by mouth daily.   benzonatate 100 MG capsule Commonly known as: TESSALON Take by mouth 3 (three) times daily as needed for cough.   carvedilol 25 MG tablet Commonly known as: COREG Take 25 mg by mouth 2 (  two) times daily.   Cyanocobalamin 5000 MCG Tbdp Take 5,000 mcg by mouth daily.   finasteride 5 MG tablet Commonly known as: PROSCAR Take 5 mg by mouth at bedtime.   FISH OIL PO Take 1 capsule by mouth 2 (two) times daily.   furosemide 40 MG tablet Commonly known as: LASIX Take 40 mg by mouth daily.   levothyroxine 75 MCG tablet Commonly known as: SYNTHROID Take 75 mcg by mouth daily.   losartan 100 MG tablet Commonly known as: COZAAR Take 100 mg by mouth daily.   multivitamin tablet Take 1 tablet daily by mouth.   nitroGLYCERIN 0.4 MG SL tablet Commonly known as: Nitrostat Place 1 tablet (0.4 mg total) under the tongue every 5 (five) minutes as needed for chest pain.   spironolactone 25 MG tablet Commonly known as: ALDACTONE Take 1 tablet (25 mg total) by mouth daily.   tamsulosin 0.4 MG Caps capsule Commonly known as: FLOMAX Take 0.4 mg by mouth at bedtime.     ticagrelor 90 MG Tabs tablet Commonly known as: BRILINTA Take 1 tablet (90 mg total) by mouth 2 (two) times daily.       Past Surgical History:  He  has a past surgical history that includes Colonoscopy (2006); Colon surgery (2006); Abdominal exploration surgery (~ 2006); Cataract extraction w/ intraocular lens  implant, bilateral (Bilateral); CORONARY BALLOON ANGIOPLASTY (N/A, 11/12/2017); CORONARY ANGIOGRAPHY (N/A, 11/12/2017); Hernia repair (~ 2006); Cardiac catheterization (N/A, 10/16/2016); Cardiac catheterization (N/A, 10/16/2016); Coronary angioplasty; and Coronary angioplasty with stent (2008).  Family History:  His family history includes Heart attack in his father and mother; Heart disease in his father and mother.  Social History:  He  reports that he quit smoking about 13 years ago. His smoking use included cigarettes. He has a 50.00 pack-year smoking history. He has never used smokeless tobacco. He reports current alcohol use. He reports current drug use. Drug: GHB.

## 2020-06-04 ENCOUNTER — Ambulatory Visit (HOSPITAL_COMMUNITY)
Admission: RE | Admit: 2020-06-04 | Discharge: 2020-06-04 | Disposition: A | Discharge status: Home / Self Care | Payer: Medicare Other | Source: Ambulatory Visit | Attending: Surgery | Admitting: Surgery

## 2020-06-04 ENCOUNTER — Other Ambulatory Visit: Payer: Self-pay

## 2020-06-04 ENCOUNTER — Encounter: Payer: Self-pay | Admitting: Surgery

## 2020-06-04 ENCOUNTER — Ambulatory Visit (INDEPENDENT_AMBULATORY_CARE_PROVIDER_SITE_OTHER): Payer: Medicare Other | Admitting: Surgery

## 2020-06-04 VITALS — BP 146/82 | HR 62 | Temp 97.5°F | Resp 20 | Ht 66.0 in | Wt 235.8 lb

## 2020-06-04 DIAGNOSIS — I714 Abdominal aortic aneurysm, without rupture, unspecified: Secondary | ICD-10-CM

## 2020-06-04 NOTE — Progress Notes (Signed)
[facility] [place]  Patient name: AREND BAHL MRN: [id number] DOB: 09-28-1941 Sex: male   REASON FOR VISIT:    Follow up  HISOTRY OF PRESENT ILLNESS:   SAIR FAULCON is a 79 y.o. male who returns today for follow-up of his abdominal aortic aneurysm.  A CT scan in 2020 showed this to measure 4.4 cm.    He does state that he has chronic abdominal pain that is not associated with eating.  He denies any weight loss.  He denies back pain.  The patient has a history of prostate cancer. He has a history of undergoing a colonoscopy complicated by perforation requiring colon resection. This was done through a midline incision. He also developed a hernia and has had a incisional hernia performed with mesh.  The patient has a history of coronary artery disease. He has had a heart attack and has undergone coronary stenting 2. He is medically managed for hypercholesterolemia with a statin. He quit smoking approximately 8 years ago when he had his heart attack.  He does complain of bilateral leg swelling   PAST MEDICAL HISTORY:   Past Medical History:  Diagnosis Date  . AAA (abdominal aortic aneurysm) (HCC)   . Arthritis    "joints, back" (11/12/2017)  . Coronary artery disease   . Hyperlipidemia   . Hypertension   . Hypothyroidism   . Myocardial infarction [facility]) 2008     FAMILY HISTORY:   Family History  Problem Relation Age of Onset  . Heart attack Mother   . Heart disease Mother   . Heart attack Father   . Heart disease Father     SOCIAL HISTORY:   Social History   Tobacco Use  . Smoking status: Former Smoker    Packs/day: 1.00    Years: 50.00    Pack years: 50.00    Types: Cigarettes    Quit date: 10/06/2006    Years since quitting: 13.6  . Smokeless tobacco: Never Used  Substance Use Topics  . Alcohol use: Yes    Alcohol/week: 0.0 standard drinks    Comment: 11/12/2017  "might drink a beer once/year"      ALLERGIES:   Allergies  Allergen Reactions  . Quinapril Shortness Of Breath and Cough     CURRENT MEDICATIONS:   Current Outpatient Medications  Medication Sig Dispense Refill  . albuterol (VENTOLIN HFA) 108 (90 Base) MCG/ACT inhaler Inhale 2 puffs into the lungs every 6 (six) hours as needed for wheezing or shortness of breath.    Marland Kitchen amLODipine (NORVASC) 5 MG tablet Take 5 mg by mouth daily.    . carvedilol (COREG) 25 MG tablet Take 25 mg by mouth 2 (two) times daily.    . Cyanocobalamin 5000 MCG TBDP Take 5,000 mcg by mouth daily.    . finasteride (PROSCAR) 5 MG tablet Take 5 mg by mouth at bedtime.     . furosemide (LASIX) 40 MG tablet Take 40 mg by mouth daily.    Marland Kitchen levothyroxine (SYNTHROID) 75 MCG tablet Take 75 mcg by mouth daily.    Marland Kitchen losartan (COZAAR) 100 MG tablet Take 100 mg by mouth daily.    . Multiple Vitamin (MULTIVITAMIN) tablet Take 1 tablet daily by mouth.    . Omega-3 Fatty Acids (FISH OIL PO) Take 1 capsule by mouth 2 (two) times daily.     Marland Kitchen spironolactone (ALDACTONE) 25 MG tablet Take 1 tablet (25 mg total) by mouth daily. 30 tablet 0  .  tamsulosin (FLOMAX) 0.4 MG CAPS capsule Take 0.4 mg by mouth at bedtime.     . ticagrelor (BRILINTA) 90 MG TABS tablet Take 1 tablet (90 mg total) by mouth 2 (two) times daily. 60 tablet 3  . benzonatate (TESSALON) 100 MG capsule Take by mouth 3 (three) times daily as needed for cough. (Patient not taking: Reported on 06/04/2020)    . nitroGLYCERIN (NITROSTAT) 0.4 MG SL tablet Place 1 tablet (0.4 mg total) under the tongue every 5 (five) minutes as needed for chest pain. 100 tablet 3   No current facility-administered medications for this visit.    REVIEW OF SYSTEMS:   [X]  denotes positive finding, [ ]  denotes negative finding Cardiac  Comments:  Chest pain or chest pressure:    Shortness of breath upon exertion:    Short of breath when lying flat:    Irregular heart rhythm:        Vascular    Pain in calf, thigh,  or hip brought on by ambulation:    Pain in feet at night that wakes you up from your sleep:     Blood clot in your veins:    Leg swelling:  x       Pulmonary    Oxygen at home:    Productive cough:     Wheezing:         Neurologic    Sudden weakness in arms or legs:     Sudden numbness in arms or legs:     Sudden onset of difficulty speaking or slurred speech:    Temporary loss of vision in one eye:     Problems with dizziness:         Gastrointestinal    Blood in stool:     Vomited blood:         Genitourinary    Burning when urinating:     Blood in urine:        Psychiatric    Major depression:         Hematologic    Bleeding problems:    Problems with blood clotting too easily:        Skin    Rashes or ulcers:        Constitutional    Fever or chills:      PHYSICAL EXAM:   Vitals:   06/04/20 0940  BP: (!) 146/82  Pulse: 62  Resp: 20  Temp: (!) 97.5 F (36.4 C)  SpO2: 95%  Weight: 235 lb 12.8 oz (107 kg)  Height: 5\' 6"  (1.676 m)    GENERAL: The patient is a well-nourished male, in no acute distress. The vital signs are documented above. CARDIAC: There is a regular rate and rhythm.  VASCULAR: 1-2+ bilateral pitting edema.  Pedal pulses are nonpalpable PULMONARY: Non-labored respirations ABDOMEN: Soft and non-tender   MUSCULOSKELETAL: There are no major deformities or cyanosis. NEUROLOGIC: No focal weakness or paresthesias are detected. SKIN: There are no ulcers or rashes noted. PSYCHIATRIC: The patient has a normal affect.  STUDIES:   I have reviewed his AAA duplex which shows maximal diameter of 4.77 cm   MEDICAL ISSUES:   AAA: Maximum diameter today is 4.7 cm.  I will repeat his CT scan in 6 months.  PAD: He continues to be without signs or symptoms of claudication.  I will repeat his ABI when he follows up in 6 months  Leg swelling: I have encouraged him to wear compression socks which he has at home.  Carotid: I will get a carotid  duplex as a preoperative study at his next visit.    Charlena Cross, MD, FACS [facility] [phone] Pager [phone]

## 2020-06-05 ENCOUNTER — Other Ambulatory Visit: Payer: Self-pay | Admitting: *Deleted

## 2020-06-05 DIAGNOSIS — I779 Disorder of arteries and arterioles, unspecified: Secondary | ICD-10-CM

## 2020-06-05 DIAGNOSIS — I714 Abdominal aortic aneurysm, without rupture, unspecified: Secondary | ICD-10-CM

## 2020-06-05 DIAGNOSIS — R609 Edema, unspecified: Secondary | ICD-10-CM

## 2020-11-20 ENCOUNTER — Other Ambulatory Visit: Payer: Self-pay | Admitting: Internal Medicine

## 2020-11-21 LAB — BASIC METABOLIC PANEL WITH GFR
BUN/Creatinine Ratio: 17 (calc) (ref 6–22)
BUN: 29 mg/dL — ABNORMAL HIGH (ref 7–25)
CO2: 20 mmol/L (ref 20–32)
Calcium: 9.1 mg/dL (ref 8.6–10.3)
Chloride: 109 mmol/L (ref 98–110)
Creat: 1.68 mg/dL — ABNORMAL HIGH (ref 0.70–1.11)
GFR, Est African American: 44 mL/min/{1.73_m2} — ABNORMAL LOW (ref >=60)
GFR, Est Non African American: 38 mL/min/{1.73_m2} — ABNORMAL LOW (ref >=60)
Glucose, Bld: 104 mg/dL — ABNORMAL HIGH (ref 65–99)
Potassium: 4.4 mmol/L (ref 3.5–5.3)
Sodium: 143 mmol/L (ref 135–146)

## 2020-11-21 LAB — EXTRA LAV TOP TUBE

## 2020-11-21 LAB — TSH: TSH: 3.22 mIU/L (ref 0.40–4.50)

## 2020-11-21 LAB — T4, FREE: Free T4: 1 ng/dL (ref 0.8–1.8)

## 2020-12-12 ENCOUNTER — Other Ambulatory Visit: Payer: Self-pay | Admitting: *Deleted

## 2020-12-12 DIAGNOSIS — I714 Abdominal aortic aneurysm, without rupture, unspecified: Secondary | ICD-10-CM

## 2020-12-12 DIAGNOSIS — I779 Disorder of arteries and arterioles, unspecified: Secondary | ICD-10-CM

## 2020-12-17 ENCOUNTER — Encounter: Payer: Self-pay | Admitting: Surgery

## 2020-12-17 ENCOUNTER — Ambulatory Visit (INDEPENDENT_AMBULATORY_CARE_PROVIDER_SITE_OTHER)
Admission: RE | Admit: 2020-12-17 | Discharge: 2020-12-17 | Disposition: A | Discharge status: Home / Self Care | Payer: Medicare Other | Source: Ambulatory Visit | Attending: Surgery | Admitting: Surgery

## 2020-12-17 ENCOUNTER — Ambulatory Visit (HOSPITAL_COMMUNITY)
Admission: RE | Admit: 2020-12-17 | Discharge: 2020-12-17 | Disposition: A | Discharge status: Home / Self Care | Payer: Medicare Other | Source: Ambulatory Visit | Attending: Surgery | Admitting: Surgery

## 2020-12-17 ENCOUNTER — Ambulatory Visit: Payer: Medicare Other | Admitting: Surgery

## 2020-12-17 ENCOUNTER — Other Ambulatory Visit: Payer: Self-pay

## 2020-12-17 VITALS — BP 149/84 | HR 64 | Temp 97.7°F | Resp 20 | Ht 66.0 in | Wt 246.0 lb

## 2020-12-17 DIAGNOSIS — I714 Abdominal aortic aneurysm, without rupture, unspecified: Secondary | ICD-10-CM

## 2020-12-17 DIAGNOSIS — I779 Disorder of arteries and arterioles, unspecified: Secondary | ICD-10-CM

## 2020-12-17 DIAGNOSIS — I6523 Occlusion and stenosis of bilateral carotid arteries: Secondary | ICD-10-CM

## 2020-12-17 NOTE — Progress Notes (Signed)
[facility] Specialist of [place]  Patient name: Bryan Carter MRN: [id number] DOB: [birth year] Sex: male   REASON FOR VISIT:    Follow up  HISOTRY OF PRESENT ILLNESS:    Bryan Carter a 80 y.o.malewho returns today for follow-up of his abdominal aortic aneurysm. A CT scan in 2020 showed this to measure 4.4 cm.     Ultrasound at his last visit showed a maximal diameter 4.7 cm.  He is back today for follow-up.  He does state that he has chronic abdominal pain that is not associated with eating.  He denies any weight loss.  He denies back pain.  The patient has a history of prostate cancer. He has a history of undergoing a colonoscopy complicated by perforation requiring colon resection. This was done through a midline incision. He also developed a hernia and has had a incisional hernia performed with mesh.  The patient has a history of coronary artery disease. He has had a heart attack and has undergone coronary stenting 2. He is medically managed for hypercholesterolemia with a statin. He quit smoking approximately 8 years ago when he had his heart attack.  He does complain of bilateral leg swelling   PAST MEDICAL HISTORY:   Past Medical History:  Diagnosis Date  . AAA (abdominal aortic aneurysm) (HCC)   . Arthritis    "joints, back" (11/12/2017)  . Coronary artery disease   . Hyperlipidemia   . Hypertension   . Hypothyroidism   . Myocardial infarction [facility]) 2008     FAMILY HISTORY:   Family History  Problem Relation Age of Onset  . Heart attack Mother   . Heart disease Mother   . Heart attack Father   . Heart disease Father     SOCIAL HISTORY:   Social History   Tobacco Use  . Smoking status: Former Smoker    Packs/day: 1.00    Years: 50.00    Pack years: 50.00    Types: Cigarettes    Quit date: 10/06/2006    Years since quitting: 14.2  . Smokeless tobacco: Never Used  Substance Use Topics  . Alcohol  use: Yes    Alcohol/week: 0.0 standard drinks    Comment: 11/12/2017  "might drink a beer once/year"     ALLERGIES:   Allergies  Allergen Reactions  . Quinapril Shortness Of Breath and Cough     CURRENT MEDICATIONS:   Current Outpatient Medications  Medication Sig Dispense Refill  . albuterol (VENTOLIN HFA) 108 (90 Base) MCG/ACT inhaler Inhale 2 puffs into the lungs every 6 (six) hours as needed for wheezing or shortness of breath.    Marland Kitchen amLODipine (NORVASC) 5 MG tablet Take 5 mg by mouth daily.    Marland Kitchen atorvastatin (LIPITOR) 80 MG tablet Take 80 mg by mouth daily.    . carvedilol (COREG) 25 MG tablet Take 25 mg by mouth 2 (two) times daily.    . Cyanocobalamin 5000 MCG TBDP Take 5,000 mcg by mouth daily.    . finasteride (PROSCAR) 5 MG tablet Take 5 mg by mouth at bedtime.     . furosemide (LASIX) 40 MG tablet Take 40 mg by mouth daily.    Marland Kitchen levothyroxine (SYNTHROID) 75 MCG tablet Take 75 mcg by mouth daily.    Marland Kitchen losartan (COZAAR) 100 MG tablet Take 100 mg by mouth daily.    . Multiple Vitamin (MULTIVITAMIN) tablet Take 1 tablet daily by mouth.    . Omega-3 Fatty Acids (FISH OIL PO)  Take 1 capsule by mouth 2 (two) times daily.     Marland Kitchen spironolactone (ALDACTONE) 25 MG tablet Take 1 tablet (25 mg total) by mouth daily. 30 tablet 0  . tamsulosin (FLOMAX) 0.4 MG CAPS capsule Take 0.4 mg by mouth at bedtime.     . ticagrelor (BRILINTA) 90 MG TABS tablet Take 1 tablet (90 mg total) by mouth 2 (two) times daily. 60 tablet 3  . nitroGLYCERIN (NITROSTAT) 0.4 MG SL tablet Place 1 tablet (0.4 mg total) under the tongue every 5 (five) minutes as needed for chest pain. 100 tablet 3   No current facility-administered medications for this visit.    REVIEW OF SYSTEMS:   [X]  denotes positive finding, [ ]  denotes negative finding Cardiac  Comments:  Chest pain or chest pressure:    Shortness of breath upon exertion:    Short of breath when lying flat:    Irregular heart rhythm:        Vascular     Pain in calf, thigh, or hip brought on by ambulation:    Pain in feet at night that wakes you up from your sleep:     Blood clot in your veins:    Leg swelling:         Pulmonary    Oxygen at home:    Productive cough:     Wheezing:         Neurologic    Sudden weakness in arms or legs:     Sudden numbness in arms or legs:     Sudden onset of difficulty speaking or slurred speech:    Temporary loss of vision in one eye:     Problems with dizziness:         Gastrointestinal    Blood in stool:     Vomited blood:         Genitourinary    Burning when urinating:     Blood in urine:        Psychiatric    Major depression:         Hematologic    Bleeding problems:    Problems with blood clotting too easily:        Skin    Rashes or ulcers:        Constitutional    Fever or chills:      PHYSICAL EXAM:   Vitals:   12/17/20 0944 12/17/20 0946  BP: 133/72 (!) 149/84  Pulse: 64   Resp: 20   Temp: 97.7 F (36.5 C)   SpO2: 96%   Weight: 246 lb (111.6 kg)   Height: 5\' 6"  (1.676 m)     GENERAL: The patient is a well-nourished male, in no acute distress. The vital signs are documented above. CARDIAC: There is a regular rate and rhythm.  VASCULAR: Nonpalpable pedal pulses PULMONARY: Non-labored respirations MUSCULOSKELETAL: There are no major deformities or cyanosis. NEUROLOGIC: No focal weakness or paresthesias are detected. SKIN: There are no ulcers or rashes noted. PSYCHIATRIC: The patient has a normal affect.  STUDIES:   I have reviewed the following: Carotid: Right Carotid: Velocities in the right ICA are consistent with a 40-59%         stenosis. Non-hemodynamically significant plaque <50% noted  in         the CCA.   Left Carotid: Velocities in the left ICA are consistent with a 40-59%  stenosis.   Vertebrals: Left vertebral artery demonstrates antegrade flow. Right  vertebral  artery was not visualized.    Aorta: Maximum diameter is 4.8 cm  ABI/TBIToday's ABIToday's TBIPrevious ABIPrevious TBI  +-------+-----------+-----------+------------+------------+  Right 0.70    0.64    0.78    0.43      +-------+-----------+-----------+------------+------------+  Left  0.58    0.40    0.70    0.49      +-------+-----------+-----------+------------+------------+  MEDICAL ISSUES:   AAA: Maximum diameter is 4.8 cm.  I will bring him back in 6 months with a CT angiogram to make sure that the ultrasound is correlating and to confirm that he remains an endovascular candidate  Claudication: Patient does not have symptoms of lifestyle limiting claudication.  I will continue to monitor this  Carotid: He remains asymptomatic with mild bilateral stenosis.  He will need a repeat ultrasound in 1 year.    Charlena Cross, MD, FACS [facility] [phone] Pager [phone]

## 2021-02-26 ENCOUNTER — Other Ambulatory Visit: Payer: Self-pay

## 2021-02-26 ENCOUNTER — Encounter: Payer: Self-pay | Admitting: Pulmonary Disease

## 2021-02-26 ENCOUNTER — Ambulatory Visit: Payer: Medicare Other | Admitting: Pulmonary Disease

## 2021-02-26 VITALS — BP 124/68 | HR 60 | Temp 97.4°F | Ht 66.0 in | Wt 246.4 lb

## 2021-02-26 DIAGNOSIS — Z9989 Dependence on other enabling machines and devices: Secondary | ICD-10-CM | POA: Diagnosis not present

## 2021-02-26 DIAGNOSIS — G4733 Obstructive sleep apnea (adult) (pediatric): Secondary | ICD-10-CM | POA: Diagnosis not present

## 2021-02-26 NOTE — Progress Notes (Signed)
[facility]  Chief Complaint  Patient presents with  . Follow-up    Wearing CPAP-feels like mask is leaking, adjusts a lot thru the night,pr. good    Constitutional:  BP 124/68 (BP Location: Right Arm, Cuff Size: Normal)   Pulse 60   Temp (!) 97.4 F (36.3 C) (Temporal)   Ht 5\' 6"  (1.676 m)   Wt 246 lb 6.4 oz (111.8 kg)   SpO2 98%   BMI 39.77 kg/m   Past Medical History:  Hypertension, Hyperlipidemia, Hypothyroidism, Coronary artery disease, Abdominal aortic aneurysm  Past Surgical History:  He  has a past surgical history that includes Colonoscopy (2006); Colon surgery (2006); Abdominal exploration surgery (~ 2006); Cataract extraction w/ intraocular lens  implant, bilateral (Bilateral); CORONARY BALLOON ANGIOPLASTY (N/A, 11/12/2017); CORONARY ANGIOGRAPHY (N/A, 11/12/2017); Hernia repair (~ 2006); Cardiac catheterization (N/A, 10/16/2016); Cardiac catheterization (N/A, 10/16/2016); Coronary angioplasty; and Coronary angioplasty with stent (2008).  Brief Summary:  Bryan Carter is a 80 y.o. male former smoker with obstructive sleep apnea.      Subjective:   He feels that CPAP pressure is okay.  He is having trouble with mask fit and air leak.  Uses full face mask.  He says his mask is old and he hasn't received a new mask.  He gets about 6 hours sleep at night.  Wakes up once to use the bathroom.  Naps for about an hour few times per week, but doesn't use his CPAP then.  Physical Exam:   Appearance - well kempt   ENMT - no sinus tenderness, no oral exudate, no LAN, Mallampati 3 airway, no stridor, wears dentures  Respiratory - equal breath sounds bilaterally, no wheezing or rales  CV - s1s2 regular rate and rhythm, no murmurs  Ext - no clubbing, no edema  Skin - no rashes  Psych - normal mood and affect   Pulmonary testing:   PFT 11/14/19 >> FEV1 2.44 (104%), FEV1% 82, TLC 5.19 (80%), DLCO 71%, no BD  Sleep Tests:   HST  11/09/19 >> AHI 42.6, SpO2 low 80%.  Auto CPAP 12/25/19 to 01/23/20 >> used on 30 of 30 nights with average 7 hrs 27 min.  Average AHI 3.7 with median CPAP 9 and 95 th percentile CPAP 12 cm H2O.  Air leak.  Cardiac Tests:   Echo 08/06/16 >> EF 50 to 55%, grade 1 DD  Social History:  He  reports that he quit smoking about 14 years ago. His smoking use included cigarettes. He has a 50.00 pack-year smoking history. He has never used smokeless tobacco. He reports current alcohol use. He reports current drug use. Drug: GHB.  Family History:  His family history includes Heart attack in his father and mother; Heart disease in his father and mother.     Assessment/Plan:   Obstructive sleep apnea. - he is compliant with therapy and reports benefit - he uses [employer] for his DME - advised him to use CPAP for entire time he is asleep, including during naps - continue auto CPAP 5 to 10 cm H2O - he continues to have issues with mask leak; will have his DME refit his mask - will have his DME arrange for new CPAP supplies  Obesity. - he is aware of how his weight can impact his health, particularly in relation to sleep apena  Time Spent Involved in Patient Care on Day of Examination:  21 minutes  Follow up:  Patient Instructions  Will  have [employer] arrange for CPAP mask refitting and new CPAP supplies  Follow up in 1 year   Medication List:   Allergies as of 02/26/2021      Reactions   Quinapril Shortness Of Breath, Cough      Medication List       Accurate as of Feb 26, 2021  9:20 AM. If you have any questions, ask your nurse or doctor.        albuterol 108 (90 Base) MCG/ACT inhaler Commonly known as: VENTOLIN HFA Inhale 2 puffs into the lungs every 6 (six) hours as needed for wheezing or shortness of breath.   amLODipine 5 MG tablet Commonly known as: NORVASC Take 5 mg by mouth daily.   atorvastatin 80 MG tablet Commonly known as: LIPITOR Take 80 mg by mouth daily.    carvedilol 25 MG tablet Commonly known as: COREG Take 25 mg by mouth 2 (two) times daily.   Cyanocobalamin 5000 MCG Tbdp Take 5,000 mcg by mouth daily.   finasteride 5 MG tablet Commonly known as: PROSCAR Take 5 mg by mouth at bedtime.   FISH OIL PO Take 1 capsule by mouth 2 (two) times daily.   furosemide 40 MG tablet Commonly known as: LASIX Take 40 mg by mouth daily.   levothyroxine 75 MCG tablet Commonly known as: SYNTHROID Take 75 mcg by mouth daily.   losartan 100 MG tablet Commonly known as: COZAAR Take 100 mg by mouth daily.   multivitamin tablet Take 1 tablet daily by mouth.   nitroGLYCERIN 0.4 MG SL tablet Commonly known as: Nitrostat Place 1 tablet (0.4 mg total) under the tongue every 5 (five) minutes as needed for chest pain.   spironolactone 25 MG tablet Commonly known as: ALDACTONE Take 1 tablet (25 mg total) by mouth daily.   tamsulosin 0.4 MG Caps capsule Commonly known as: FLOMAX Take 0.4 mg by mouth at bedtime.   ticagrelor 90 MG Tabs tablet Commonly known as: BRILINTA Take 1 tablet (90 mg total) by mouth 2 (two) times daily.       Signature:  Coralyn Helling, MD [facility] Pager - [phone] 02/26/2021, 9:20 AM

## 2021-02-26 NOTE — Patient Instructions (Signed)
Will have [employer] arrange for CPAP mask refitting and new CPAP supplies  Follow up in 1 year

## 2021-05-24 ENCOUNTER — Other Ambulatory Visit: Payer: Self-pay | Admitting: *Deleted

## 2021-05-24 DIAGNOSIS — I714 Abdominal aortic aneurysm, without rupture, unspecified: Secondary | ICD-10-CM

## 2021-06-13 ENCOUNTER — Other Ambulatory Visit: Payer: Self-pay | Admitting: Surgery

## 2021-06-13 ENCOUNTER — Other Ambulatory Visit: Payer: Self-pay

## 2021-06-13 ENCOUNTER — Ambulatory Visit (HOSPITAL_COMMUNITY)
Admission: RE | Admit: 2021-06-13 | Discharge: 2021-06-13 | Disposition: A | Discharge status: Home / Self Care | Payer: Medicare Other | Source: Ambulatory Visit | Attending: Surgery | Admitting: Surgery

## 2021-06-13 DIAGNOSIS — I714 Abdominal aortic aneurysm, without rupture, unspecified: Secondary | ICD-10-CM

## 2021-06-13 LAB — POCT I-STAT CREATININE: Creatinine, Ser: 2.3 mg/dL — ABNORMAL HIGH (ref 0.61–1.24)

## 2021-06-13 MED ORDER — IOHEXOL 350 MG/ML SOLN: 100.0000 mL | Freq: Once | INTRAVENOUS | Status: DC | PRN

## 2021-06-17 ENCOUNTER — Ambulatory Visit (INDEPENDENT_AMBULATORY_CARE_PROVIDER_SITE_OTHER): Payer: Medicare Other | Admitting: Surgery

## 2021-06-17 ENCOUNTER — Encounter: Payer: Self-pay | Admitting: Surgery

## 2021-06-17 ENCOUNTER — Other Ambulatory Visit: Payer: Self-pay

## 2021-06-17 VITALS — BP 120/74 | HR 67 | Temp 97.9°F | Resp 20 | Ht 66.0 in | Wt 236.0 lb

## 2021-06-17 DIAGNOSIS — I714 Abdominal aortic aneurysm, without rupture, unspecified: Secondary | ICD-10-CM

## 2021-06-17 DIAGNOSIS — I70213 Atherosclerosis of native arteries of extremities with intermittent claudication, bilateral legs: Secondary | ICD-10-CM | POA: Diagnosis not present

## 2021-06-17 NOTE — Progress Notes (Signed)
[facility]  Patient name: Bryan Carter MRN: [id number] DOB: 29-Apr-1941 Sex: male   REASON FOR VISIT:    Follow-up  HISOTRY OF PRESENT ILLNESS:   Bryan Carter is a 80 y.o. male who returns today for follow-up of his abdominal aortic aneurysm.  A CT scan in 2020 showed this to measure 4.4 cm.      Ultrasound at his last visit showed a maximal diameter 4.8 cm.  He is back today for follow-up with a CT scan.  He does state that he has chronic abdominal pain that is not associated with eating.  He denies any weight loss.  He denies back pain.  His CT scan was done without contrast because of a rise in his creatinine from 1.6-2.2   The patient has a history of prostate cancer.  He has a history of undergoing a colonoscopy complicated by perforation requiring colon resection.  This was done through a midline incision.  He also developed a hernia and has had a incisional hernia performed with mesh.   The patient has a history of coronary artery disease.  He has had a heart attack and has undergone coronary stenting 2.  He is medically managed for hypercholesterolemia with a statin.  He quit smoking approximately 8 years ago when he had his heart attack.  He does complain of bilateral leg swelling   PAST MEDICAL HISTORY:   Past Medical History:  Diagnosis Date   AAA (abdominal aortic aneurysm) (HCC)    Arthritis    "joints, back" (11/12/2017)   Coronary artery disease    Hyperlipidemia    Hypertension    Hypothyroidism    Myocardial infarction [facility]) 2008     FAMILY HISTORY:   Family History  Problem Relation Age of Onset   Heart attack Mother    Heart disease Mother    Heart attack Father    Heart disease Father     SOCIAL HISTORY:   Social History   Tobacco Use   Smoking status: Former    Packs/day: 1.00    Years: 50.00    Pack years: 50.00    Types: Cigarettes    Quit date: 10/06/2006    Years since  quitting: 14.7   Smokeless tobacco: Never  Substance Use Topics   Alcohol use: Yes    Alcohol/week: 0.0 standard drinks    Comment: 11/12/2017  "might drink a beer once/year"     ALLERGIES:   Allergies  Allergen Reactions   Quinapril Shortness Of Breath and Cough     CURRENT MEDICATIONS:   Current Outpatient Medications  Medication Sig Dispense Refill   albuterol (VENTOLIN HFA) 108 (90 Base) MCG/ACT inhaler Inhale 2 puffs into the lungs every 6 (six) hours as needed for wheezing or shortness of breath.     amLODipine (NORVASC) 5 MG tablet Take 5 mg by mouth daily.     atorvastatin (LIPITOR) 80 MG tablet Take 80 mg by mouth daily.     carvedilol (COREG) 25 MG tablet Take 25 mg by mouth 2 (two) times daily.     Cyanocobalamin 5000 MCG TBDP Take 5,000 mcg by mouth daily.     finasteride (PROSCAR) 5 MG tablet Take 5 mg by mouth at bedtime.      furosemide (LASIX) 40 MG tablet Take 40 mg by mouth daily.     levothyroxine (SYNTHROID) 75 MCG tablet Take 75 mcg by mouth daily.     losartan (COZAAR) 100 MG tablet  Take 100 mg by mouth daily.     Multiple Vitamin (MULTIVITAMIN) tablet Take 1 tablet daily by mouth.     Omega-3 Fatty Acids (FISH OIL PO) Take 1 capsule by mouth 2 (two) times daily.      spironolactone (ALDACTONE) 25 MG tablet Take 1 tablet (25 mg total) by mouth daily. 30 tablet 0   tamsulosin (FLOMAX) 0.4 MG CAPS capsule Take 0.4 mg by mouth at bedtime.      ticagrelor (BRILINTA) 90 MG TABS tablet Take 1 tablet (90 mg total) by mouth 2 (two) times daily. 60 tablet 3   nitroGLYCERIN (NITROSTAT) 0.4 MG SL tablet Place 1 tablet (0.4 mg total) under the tongue every 5 (five) minutes as needed for chest pain. 100 tablet 3   No current facility-administered medications for this visit.    REVIEW OF SYSTEMS:   [X]  denotes positive finding, [ ]  denotes negative finding Cardiac  Comments:  Chest pain or chest pressure:    Shortness of breath upon exertion:    Short of breath  when lying flat:    Irregular heart rhythm:        Vascular    Pain in calf, thigh, or hip brought on by ambulation:    Pain in feet at night that wakes you up from your sleep:     Blood clot in your veins:    Leg swelling:         Pulmonary    Oxygen at home:    Productive cough:     Wheezing:         Neurologic    Sudden weakness in arms or legs:     Sudden numbness in arms or legs:     Sudden onset of difficulty speaking or slurred speech:    Temporary loss of vision in one eye:     Problems with dizziness:         Gastrointestinal    Blood in stool:     Vomited blood:         Genitourinary    Burning when urinating:     Blood in urine:        Psychiatric    Major depression:         Hematologic    Bleeding problems:    Problems with blood clotting too easily:        Skin    Rashes or ulcers:        Constitutional    Fever or chills:      PHYSICAL EXAM:   Vitals:   06/17/21 0840  BP: 120/74  Pulse: 67  Resp: 20  Temp: 97.9 F (36.6 C)  SpO2: 96%  Weight: 107 kg  Height: 5\' 6"  (1.676 m)    GENERAL: The patient is a well-nourished male, in no acute distress. The vital signs are documented above. CARDIAC: There is a regular rate and rhythm.  PULMONARY: Non-labored respirations ABDOMEN: Soft and non-tender  MUSCULOSKELETAL: There are no major deformities or cyanosis. NEUROLOGIC: No focal weakness or paresthesias are detected. SKIN: There are no ulcers or rashes noted. PSYCHIATRIC: The patient has a normal affect.  STUDIES:   I have reviewed the CT scan with the following findings: 1. Interval enlargement of calcified infrarenal abdominal aortic aneurysm, now measuring up to 4.9 cm (previously 4.5 cm in 08/2019). Aortic Atherosclerosis (ICD10-I70.0). Recommend follow-up CT/MR every 6 months and vascular consultation. This recommendation follows ACR consensus guidelines: White Paper of the ACR Incidental Findings Committee II  on Vascular  Findings. J Am Coll Radiol 2013; 10:789-794. 2. No acute findings within abdomen or pelvis. 3. Chronic and senescent changes, as above.  MEDICAL ISSUES:   AAA: Maximum aortic diameter was 4.9 cm based off CT scan.  He is a good candidate for endovascular repair, however I will wait until his aneurysm is larger.  He has been to follow-up in 6 months with a ultrasound and carotid duplex  Acute renal insufficiency His creatinine was 2.3.  I am going to refer him to [facility] for further evaluation.    Charlena Cross, MD, FACS [facility] [phone] Pager [phone]

## 2021-06-19 ENCOUNTER — Other Ambulatory Visit: Payer: Self-pay

## 2021-06-19 DIAGNOSIS — I6523 Occlusion and stenosis of bilateral carotid arteries: Secondary | ICD-10-CM

## 2021-06-19 DIAGNOSIS — I714 Abdominal aortic aneurysm, without rupture, unspecified: Secondary | ICD-10-CM

## 2021-10-17 IMAGING — CT CT ABD-PELV W/O CM
2 of 4 series · 16 of 46 positions shown, 18 images · non-contrast
Comparison: 01/04/2008

CLINICAL DATA: Abdominal distension, acute renal insufficiency

EXAM:
CT ABDOMEN AND PELVIS WITHOUT CONTRAST
TECHNIQUE: Multidetector CT imaging of the abdomen and pelvis was performed
following the standard protocol without IV contrast.

[Series 3: axial st · axial · 0.88mm/px · z∈[-314,+96]mm · 13 of 92 slices shown, 15 images]
[im 5/92  soft-tissue]
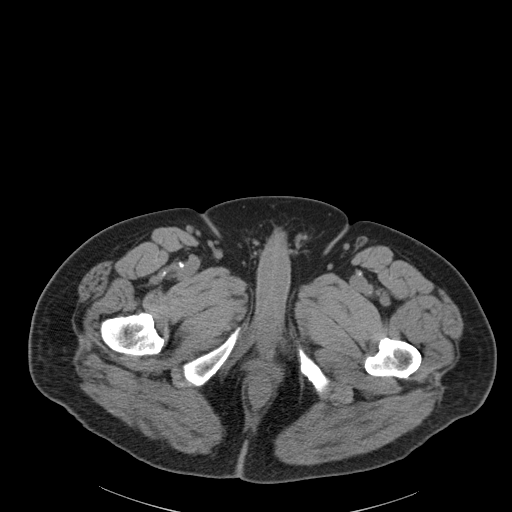
[im 5/92  bone]
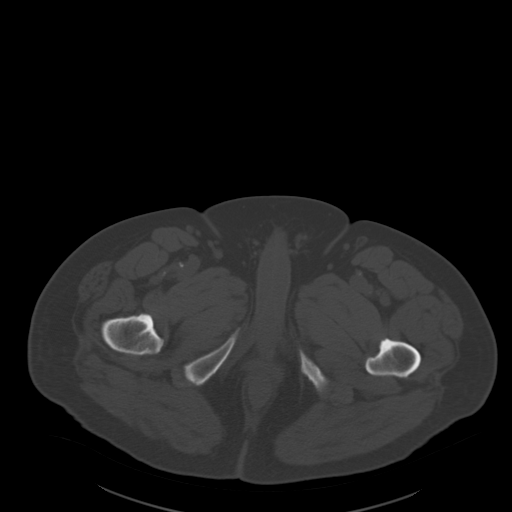
[im 14/92  soft-tissue]
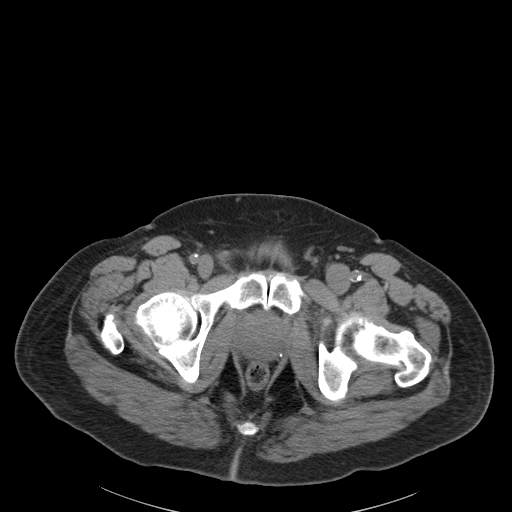
[im 19/92  soft-tissue]
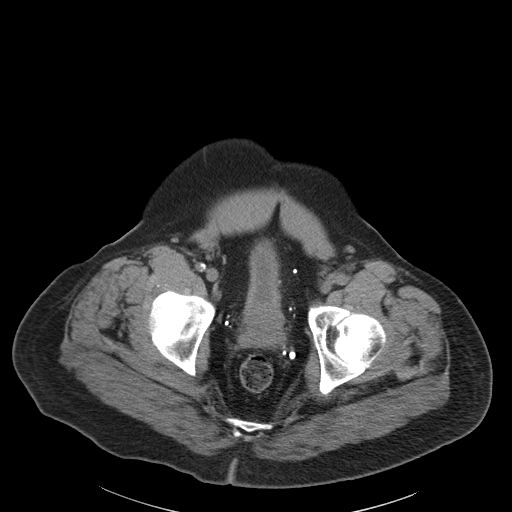
[im 28/92  soft-tissue]
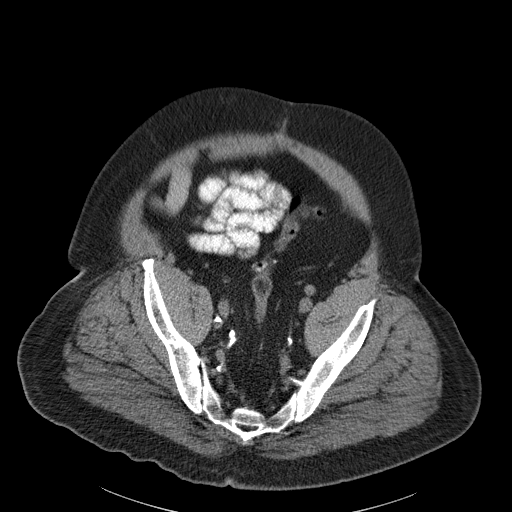
[im 32/92  soft-tissue]
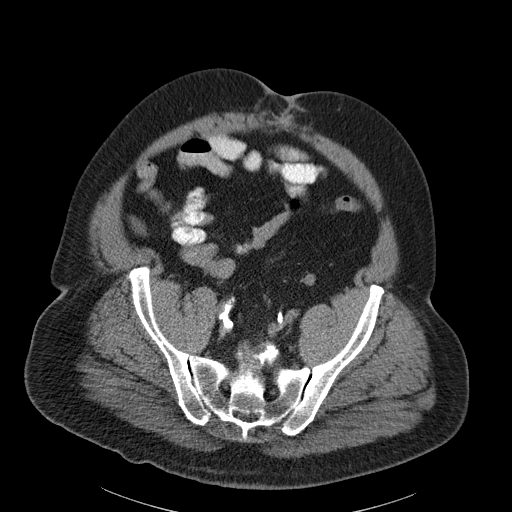
[im 41/92  soft-tissue]
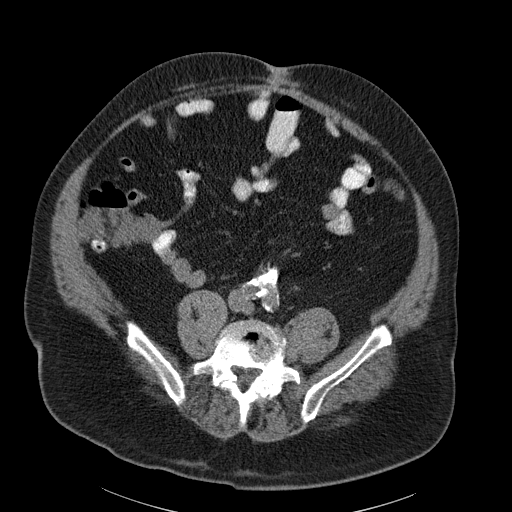
[im 46/92  soft-tissue]
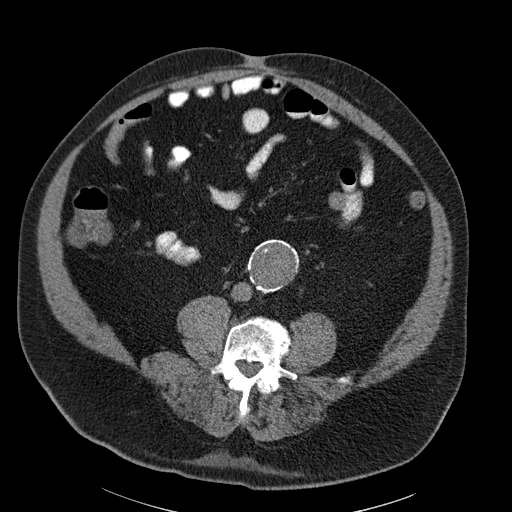
[im 51/92  soft-tissue]
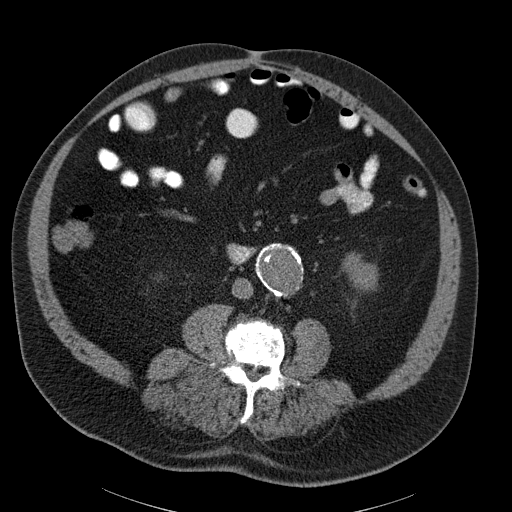
[im 60/92  soft-tissue]
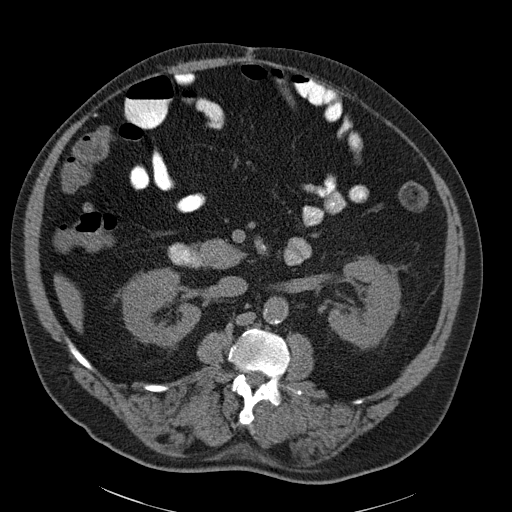
[im 60/92  bone]
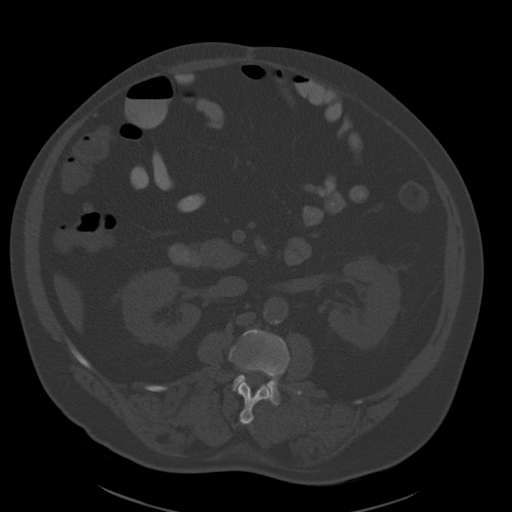
[im 64/92  soft-tissue]
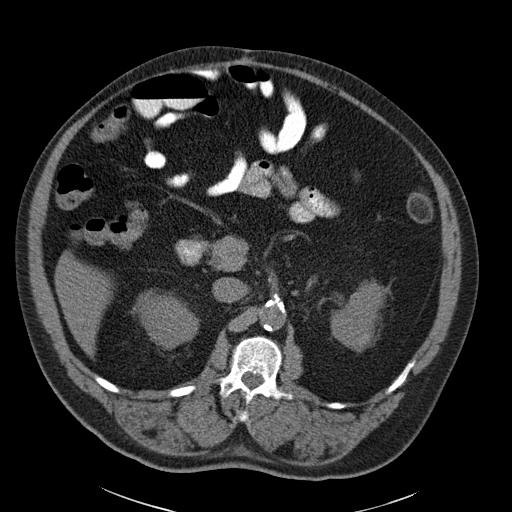
[im 73/92  soft-tissue]
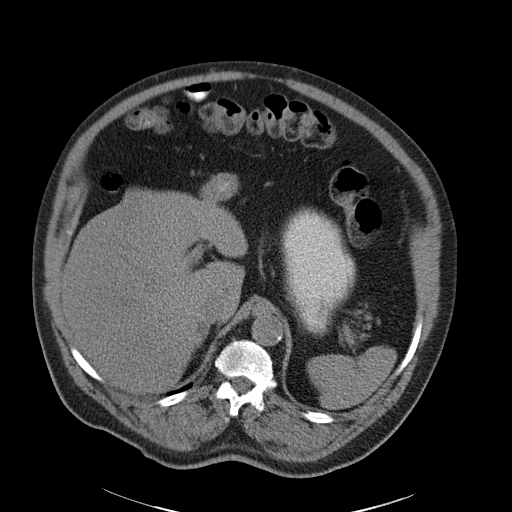
[im 78/92  soft-tissue]
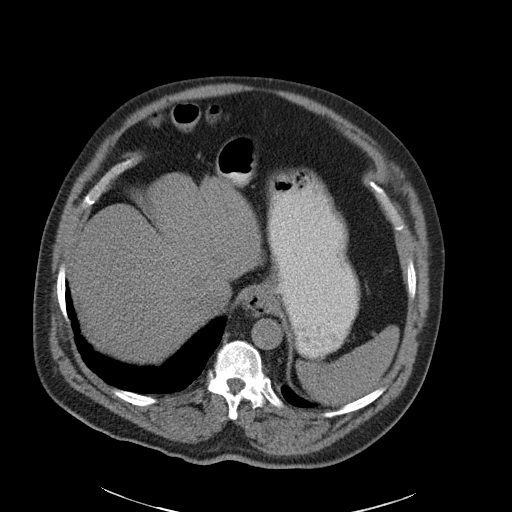
[im 87/92  soft-tissue]
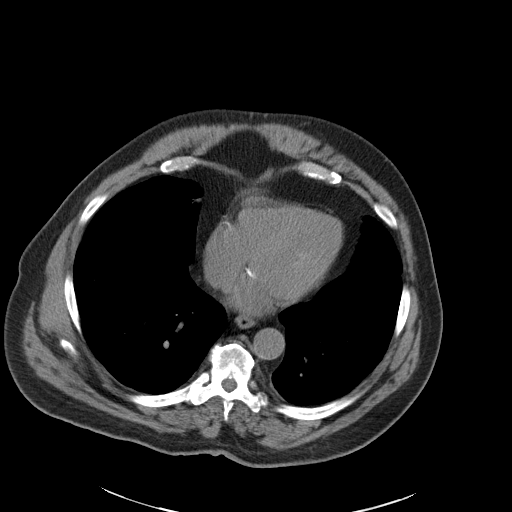

[Series 5: coronal st · coronal · 0.88mm/px · 3 of 201 slices shown]
[im 67/201  soft-tissue]
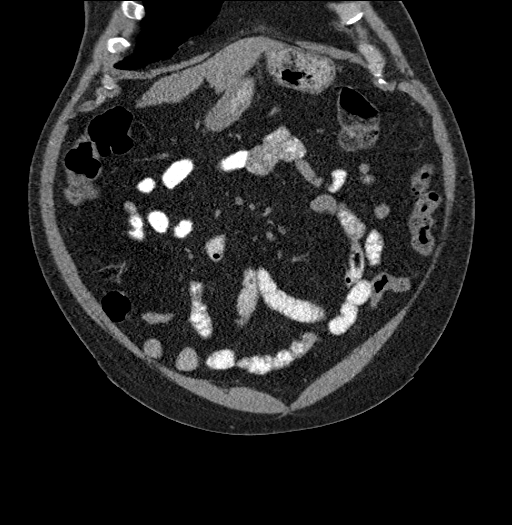
[im 89/201  soft-tissue]
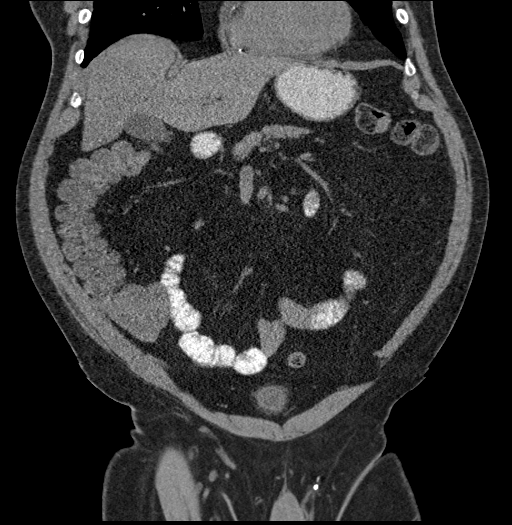
[im 112/201  soft-tissue]
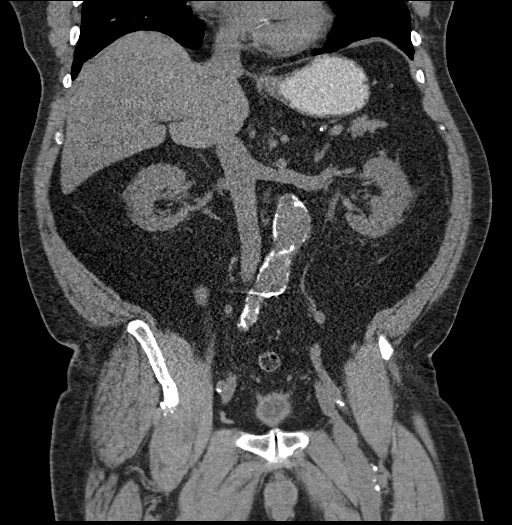

[16 of 46 positions shown; findings below may reference images not displayed]

FINDINGS: Lower chest: No acute abnormality.

Hepatobiliary: Liver is well visualized. A stable posterior left
hepatic cyst is noted measuring just over 1 cm. The gallbladder is
within normal limits.

Pancreas: Unremarkable. No pancreatic ductal dilatation or
surrounding inflammatory changes.

Spleen: Normal in size without focal abnormality.

Adrenals/Urinary Tract: Adrenal glands are within normal limits.
Kidneys are well visualized bilaterally without renal calculi or
obstructive change. The ureters are within normal limits. The
bladder is decompressed.

Stomach/Bowel: Postsurgical changes are noted in the sigmoid. Mild
diverticular changes noted. The appendix is within normal limits. No
small bowel abnormality is seen. Small sliding-type hiatal hernia is
noted.

Vascular/Lymphatic: Diffuse atherosclerotic calcifications are
identified. A focal area of aneurysmal dilatation to 4.4 cm is seen.
No perianeurysmal inflammatory changes to suggest pending rupture
are noted. No findings to suggest leakage are seen. No
lymphadenopathy is noted.

Reproductive: Prostate is unremarkable.

Other: No abdominal wall hernia or abnormality. No abdominopelvic
ascites.

Musculoskeletal: Multilevel degenerative changes of lumbar spine are
noted. Bilateral pars defects are seen at L5 with anterolisthesis of
L5 on S1. Lucency is noted in the superior endplate of L5 likely
related to a large Schmorl's node.
IMPRESSION: Changes consistent with the known abdominal aortic aneurysm. It now
measures 4.4 cm. Recommend followup by US in 1 year. This
recommendation follows ACR consensus guidelines: White Paper of the
ACR Incidental Findings Committee II on Vascular Findings. [HOSPITAL] 7788; [DATE].

Chronic changes in the lumbar spine.

Chronic changes as described above.  The

No findings to correspond with the abdominal distension are seen.

## 2021-12-16 ENCOUNTER — Ambulatory Visit: Payer: Medicare Other | Admitting: Surgery

## 2021-12-16 ENCOUNTER — Ambulatory Visit (HOSPITAL_COMMUNITY)
Admission: RE | Admit: 2021-12-16 | Discharge: 2021-12-16 | Disposition: A | Discharge status: Home / Self Care | Payer: Medicare Other | Source: Ambulatory Visit | Attending: Surgery | Admitting: Surgery

## 2021-12-16 ENCOUNTER — Ambulatory Visit (INDEPENDENT_AMBULATORY_CARE_PROVIDER_SITE_OTHER)
Admission: RE | Admit: 2021-12-16 | Discharge: 2021-12-16 | Disposition: A | Discharge status: Home / Self Care | Payer: Medicare Other | Source: Ambulatory Visit | Attending: Surgery | Admitting: Surgery

## 2021-12-16 ENCOUNTER — Encounter: Payer: Self-pay | Admitting: Surgery

## 2021-12-16 ENCOUNTER — Other Ambulatory Visit: Payer: Self-pay

## 2021-12-16 VITALS — BP 142/70 | HR 58 | Temp 97.7°F | Resp 20 | Ht 66.0 in | Wt 235.0 lb

## 2021-12-16 DIAGNOSIS — I7143 Infrarenal abdominal aortic aneurysm, without rupture: Secondary | ICD-10-CM | POA: Diagnosis not present

## 2021-12-16 DIAGNOSIS — I6523 Occlusion and stenosis of bilateral carotid arteries: Secondary | ICD-10-CM | POA: Insufficient documentation

## 2021-12-16 DIAGNOSIS — I714 Abdominal aortic aneurysm, without rupture, unspecified: Secondary | ICD-10-CM | POA: Insufficient documentation

## 2021-12-16 NOTE — Progress Notes (Signed)
? ?[facility] [place] ? ?Patient name: Bryan Carter MRN: [id number] DOB: [birth year] Sex: male ? ? ?REASON FOR VISIT:  ? ? ?Follow up ? ?HISOTRY OF PRESENT ILLNESS:  ? ?Bryan Carter is a 81 y.o. male who returns today for follow-up of his abdominal aortic aneurysm.  A CT scan in 2020 showed this to measure 4.4 cm.      CT scan 6 months ago showed a 4.9 cm AAA.  He does state that he has chronic abdominal pain that is not associated with eating.  He denies any weight loss.  He denies back pain.  His CT scan was done without contrast because of a rise in his creatinine from 1.6-2.2.  I referred him to [facility] ?  ?The patient has a history of prostate cancer.  He has a history of undergoing a colonoscopy complicated by perforation requiring colon resection.  This was done through a midline incision.  He also developed a hernia and has had a incisional hernia performed with mesh. ?  ?The patient has a history of coronary artery disease.  He has had a heart attack and has undergone coronary stenting ?2.  He is medically managed for hypercholesterolemia with a statin.  He quit smoking approximately 8 years ago when he had his heart attack.  He does complain of bilateral leg swelling ? ? ?PAST MEDICAL HISTORY:  ? ?Past Medical History:  ?Diagnosis Date  ? AAA (abdominal aortic aneurysm)   ? Arthritis   ? "joints, back" (11/12/2017)  ? Coronary artery disease   ? Hyperlipidemia   ? Hypertension   ? Hypothyroidism   ? Myocardial infarction [facility]) 2008  ? ? ? ?FAMILY HISTORY:  ? ?Family History  ?Problem Relation Age of Onset  ? Heart attack Mother   ? Heart disease Mother   ? Heart attack Father   ? Heart disease Father   ? ? ?SOCIAL HISTORY:  ? ?Social History  ? ?Tobacco Use  ? Smoking status: Former  ?  Packs/day: 1.00  ?  Years: 50.00  ?  Pack years: 50.00  ?  Types: Cigarettes  ?  Quit date: 10/06/2006  ?  Years since quitting: 15.2  ? Smokeless tobacco:  Never  ?Substance Use Topics  ? Alcohol use: Yes  ?  Alcohol/week: 0.0 standard drinks  ?  Comment: 11/12/2017  "might drink a beer once/year"  ? ? ? ?ALLERGIES:  ? ?Allergies  ?Allergen Reactions  ? Quinapril Shortness Of Breath and Cough  ? ? ? ?CURRENT MEDICATIONS:  ? ?Current Outpatient Medications  ?Medication Sig Dispense Refill  ? albuterol (VENTOLIN HFA) 108 (90 Base) MCG/ACT inhaler Inhale 2 puffs into the lungs every 6 (six) hours as needed for wheezing or shortness of breath.    ? amLODipine (NORVASC) 5 MG tablet Take 5 mg by mouth daily.    ? atorvastatin (LIPITOR) 80 MG tablet Take 80 mg by mouth daily.    ? carvedilol (COREG) 25 MG tablet Take 25 mg by mouth 2 (two) times daily.    ? Cyanocobalamin 5000 MCG TBDP Take 5,000 mcg by mouth daily.    ? finasteride (PROSCAR) 5 MG tablet Take 5 mg by mouth at bedtime.     ? furosemide (LASIX) 40 MG tablet Take 40 mg by mouth daily.    ? levothyroxine (SYNTHROID) 75 MCG tablet Take 75 mcg by mouth daily.    ? losartan (COZAAR) 100 MG tablet Take 100 mg by mouth  daily.    ? Multiple Vitamin (MULTIVITAMIN) tablet Take 1 tablet daily by mouth.    ? Omega-3 Fatty Acids (FISH OIL PO) Take 1 capsule by mouth 2 (two) times daily.     ? spironolactone (ALDACTONE) 25 MG tablet Take 1 tablet (25 mg total) by mouth daily. 30 tablet 0  ? tamsulosin (FLOMAX) 0.4 MG CAPS capsule Take 0.4 mg by mouth at bedtime.     ? ticagrelor (BRILINTA) 90 MG TABS tablet Take 1 tablet (90 mg total) by mouth 2 (two) times daily. 60 tablet 3  ? nitroGLYCERIN (NITROSTAT) 0.4 MG SL tablet Place 1 tablet (0.4 mg total) under the tongue every 5 (five) minutes as needed for chest pain. 100 tablet 3  ? ?No current facility-administered medications for this visit.  ? ? ?REVIEW OF SYSTEMS:  ? ?[X]  denotes positive finding, [ ]  denotes negative finding ?Cardiac  Comments:  ?Chest pain or chest pressure:    ?Shortness of breath upon exertion:    ?Short of breath when lying flat:    ?Irregular heart  rhythm:    ?    ?Vascular    ?Pain in calf, thigh, or hip brought on by ambulation:    ?Pain in feet at night that wakes you up from your sleep:     ?Blood clot in your veins:    ?Leg swelling:     ?    ?Pulmonary    ?Oxygen at home:    ?Productive cough:     ?Wheezing:     ?    ?Neurologic    ?Sudden weakness in arms or legs:     ?Sudden numbness in arms or legs:     ?Sudden onset of difficulty speaking or slurred speech:    ?Temporary loss of vision in one eye:     ?Problems with dizziness:     ?    ?Gastrointestinal    ?Blood in stool:     ?Vomited blood:     ?    ?Genitourinary    ?Burning when urinating:     ?Blood in urine:    ?    ?Psychiatric    ?Major depression:     ?    ?Hematologic    ?Bleeding problems:    ?Problems with blood clotting too easily:    ?    ?Skin    ?Rashes or ulcers:    ?    ?Constitutional    ?Fever or chills:    ? ? ?PHYSICAL EXAM:  ? ?Vitals:  ? 12/16/21 0945 12/16/21 0947  ?BP: (!) 149/74 (!) 142/70  ?Pulse: (!) 58   ?Resp: 20   ?Temp: 97.7 ?F (36.5 ?C)   ?SpO2: 98%   ?Weight: 235 lb (106.6 kg)   ?Height: 5\' 6"  (1.676 m)   ? ? ?GENERAL: The patient is a well-nourished male, in no acute distress. The vital signs are documented above. ?CARDIAC: There is a regular rate and rhythm.  ?VASCULAR: Nonpalpable pedal pulses ?PULMONARY: Non-labored respirations ?ABDOMEN: Soft and non-tender  ?MUSCULOSKELETAL: There are no major deformities or cyanosis. ?NEUROLOGIC: No focal weakness or paresthesias are detected. ?SKIN: There are no ulcers or rashes noted. ?PSYCHIATRIC: The patient has a normal affect. ? ?STUDIES:  ? 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 The ECA appears >50% stenosed.  ? ?Left  Carotid: Velocities in the left ICA are consistent with a 1-39%  ?stenosis.  ?              Increased velocity in the distal ICA noted to be tortuous.  ? ?Vertebrals:  Left vertebral artery  demonstrates antegrade flow.  ?             Right vertebral artery not visualized  ?Subclavians: Normal flow hemodynamics were seen in bilateral subclavian  ?             arteries.  ? ?EVAR: ?Abdominal Aorta: The largest aortic diameter has decreased compared to  ?prior exam. Previous diameter by duplex measured 4.4 x 4.97 cms. Unable to  ?obtain prior measurements.  ? ?MEDICAL ISSUES:  ? ?AAA: Maximum diameter by ultrasound today was 4.6 cm.  I know by CAT scan it is at least 4.9 however I suspect that ultrasound indicates that there has not been any significant interval growth.  Therefore, I will plan on the patient returning in 6 months with a CAT scan. ? ? ? ?[name], IV, MD, FACS ?[facility] [place] ?Tel [phone] ?Pager [phone]  ?

## 2021-12-18 ENCOUNTER — Encounter: Payer: Self-pay | Admitting: Surgery

## 2022-02-21 ENCOUNTER — Telehealth: Payer: Self-pay | Admitting: Pulmonary Disease

## 2022-02-21 ENCOUNTER — Encounter: Payer: Self-pay | Admitting: Pulmonary Disease

## 2022-02-21 ENCOUNTER — Ambulatory Visit: Payer: Medicare Other | Admitting: Pulmonary Disease

## 2022-02-21 VITALS — BP 124/78 | HR 64 | Temp 97.8°F | Ht 66.0 in | Wt 236.0 lb

## 2022-02-21 DIAGNOSIS — G4733 Obstructive sleep apnea (adult) (pediatric): Secondary | ICD-10-CM

## 2022-02-21 NOTE — Progress Notes (Signed)
[facility]  Chief Complaint  Patient presents with   Follow-up    1 year f/u, OSA on CPAP    Constitutional:  BP 124/78 (BP Location: Left Arm, Cuff Size: Normal)   Pulse 64   Temp 97.8 F (36.6 C)   Ht 5\' 6"  (1.676 m)   Wt 236 lb (107 kg)   SpO2 97%   BMI 38.09 kg/m   Past Medical History:  Hypertension, Hyperlipidemia, Hypothyroidism, Coronary artery disease, Abdominal aortic aneurysm  Past Surgical History:  He  has a past surgical history that includes Colonoscopy (2006); Colon surgery (2006); Abdominal exploration surgery (~ 2006); Cataract extraction w/ intraocular lens  implant, bilateral (Bilateral); CORONARY BALLOON ANGIOPLASTY (N/A, 11/12/2017); CORONARY ANGIOGRAPHY (N/A, 11/12/2017); Hernia repair (~ 2006); Cardiac catheterization (N/A, 10/16/2016); Cardiac catheterization (N/A, 10/16/2016); Coronary angioplasty; and Coronary angioplasty with stent (2008).  Brief Summary:  Bryan Carter is a 81 y.o. male former smoker with obstructive sleep apnea.      Subjective:   Goes to bed at 10, wakes up at 6.  Feels rested.  Has full face mask.  Leaks sometimes, but not bad.    Physical Exam:   Appearance - well kempt   ENMT - no sinus tenderness, no oral exudate, no LAN, Mallampati 3 airway, no stridor  Respiratory - equal breath sounds bilaterally, no wheezing or rales  CV - s1s2 regular rate and rhythm, no murmurs  Ext - no clubbing, no edema  Skin - no rashes  Psych - normal mood and affect    Pulmonary testing:  PFT 11/14/19 >> FEV1 2.44 (104%), FEV1% 82, TLC 5.19 (80%), DLCO 71%, no BD  Sleep Tests:  HST 11/09/19 >> AHI 42.6, SpO2 low 80%. Auto CPAP 12/25/19 to 01/23/20 >> used on 30 of 30 nights with average 7 hrs 27 min.  Average AHI 3.7 with median CPAP 9 and 95 th percentile CPAP 12 cm H2O.  Air leak.  Cardiac Tests:  Echo 08/06/16 >> EF 50 to 55%, grade 1 DD  Social History:  He  reports that he quit  smoking about 15 years ago. His smoking use included cigarettes. He has a 50.00 pack-year smoking history. He has never used smokeless tobacco. He reports current alcohol use. He reports current drug use. Drug: GHB.  Family History:  His family history includes Heart attack in his father and mother; Heart disease in his father and mother.     Assessment/Plan:   Obstructive sleep apnea. - he is compliant with therapy and reports benefit - he uses [employer] for his DME - continue auto CPAP 5 to 10 cm H2O - will get a copy of his download and call with results, and arrange for new SD card  Obesity. - he is aware of how his weight can impact his health, particularly in relation to sleep apena  Time Spent Involved in Patient Care on Day of Examination:  16 minutes  Follow up:   Patient Instructions  You will be eligible for a new CPAP machine in 2026  Will get a copy of your CPAP report and call with results, and see if we can arrange for a new SD card  Follow up in 1 year  Medication List:   Allergies as of 02/21/2022       Reactions   Quinapril Shortness Of Breath, Cough        Medication List        Accurate as of Feb 21, 2022  9:27 AM. If you have any questions, ask your nurse or doctor.          albuterol 108 (90 Base) MCG/ACT inhaler Commonly known as: VENTOLIN HFA Inhale 2 puffs into the lungs every 6 (six) hours as needed for wheezing or shortness of breath.   amLODipine 5 MG tablet Commonly known as: NORVASC Take 5 mg by mouth daily.   atorvastatin 80 MG tablet Commonly known as: LIPITOR Take 80 mg by mouth daily.   carvedilol 25 MG tablet Commonly known as: COREG Take 25 mg by mouth 2 (two) times daily.   Cyanocobalamin 5000 MCG Tbdp Take 5,000 mcg by mouth daily.   finasteride 5 MG tablet Commonly known as: PROSCAR Take 5 mg by mouth at bedtime.   FISH OIL PO Take 1 capsule by mouth 2 (two) times daily.   furosemide 40 MG tablet Commonly  known as: LASIX Take 40 mg by mouth daily.   levothyroxine 75 MCG tablet Commonly known as: SYNTHROID Take 75 mcg by mouth daily.   losartan 100 MG tablet Commonly known as: COZAAR Take 100 mg by mouth daily.   multivitamin tablet Take 1 tablet daily by mouth.   nitroGLYCERIN 0.4 MG SL tablet Commonly known as: Nitrostat Place 1 tablet (0.4 mg total) under the tongue every 5 (five) minutes as needed for chest pain.   spironolactone 25 MG tablet Commonly known as: ALDACTONE Take 1 tablet (25 mg total) by mouth daily.   tamsulosin 0.4 MG Caps capsule Commonly known as: FLOMAX Take 0.4 mg by mouth at bedtime.   ticagrelor 90 MG Tabs tablet Commonly known as: BRILINTA Take 1 tablet (90 mg total) by mouth 2 (two) times daily.        Signature:  Coralyn Helling, MD [facility] Pager - [phone] 02/21/2022, 9:27 AM

## 2022-02-21 NOTE — Patient Instructions (Signed)
You will be eligible for a new CPAP machine in 2026  Will get a copy of your CPAP report and call with results, and see if we can arrange for a new SD card  Follow up in 1 year

## 2022-02-24 NOTE — Telephone Encounter (Signed)
I spoke to pt & he states he got cpap from dme company on [address] - that is [employer].  I will send order to them.  Nothing further needed.

## 2022-03-17 ENCOUNTER — Telehealth: Payer: Self-pay | Admitting: Pulmonary Disease

## 2022-03-17 NOTE — Telephone Encounter (Signed)
Auto CPAP 01/25/22 to 02/23/22 >> used on 30 of 30 nights with average 6 hrs 29 min.  Average AHI 10.2 with median CPAP 10 and 95 th percentile CPAP 14 cm H2O   Please let him know his CPAP report shows good control of sleep apnea with current settings.

## 2022-03-18 NOTE — Telephone Encounter (Signed)
ATC patient.  LMTCB. 

## 2022-03-19 NOTE — Telephone Encounter (Signed)
Called and notified patient and he voiced understanding.  Nothing further needed at this time.  

## 2022-05-26 ENCOUNTER — Other Ambulatory Visit: Payer: Self-pay

## 2022-05-26 DIAGNOSIS — I7143 Infrarenal abdominal aortic aneurysm, without rupture: Secondary | ICD-10-CM

## 2022-06-23 ENCOUNTER — Ambulatory Visit (HOSPITAL_COMMUNITY)
Admission: RE | Admit: 2022-06-23 | Discharge: 2022-06-23 | Disposition: A | Discharge status: Home / Self Care | Payer: Medicare Other | Source: Ambulatory Visit | Attending: Surgery | Admitting: Surgery

## 2022-06-23 DIAGNOSIS — I7143 Infrarenal abdominal aortic aneurysm, without rupture: Secondary | ICD-10-CM | POA: Diagnosis not present

## 2022-06-23 MED ORDER — IOHEXOL 350 MG/ML SOLN
100.0000 mL | Freq: Once | INTRAVENOUS | Status: AC | PRN
  Administered 2022-06-23: 100 mL via INTRAVENOUS

## 2022-06-30 ENCOUNTER — Ambulatory Visit: Payer: Medicare Other | Admitting: Surgery

## 2022-07-07 ENCOUNTER — Ambulatory Visit: Payer: Medicare Other | Admitting: Surgery

## 2022-07-07 ENCOUNTER — Encounter: Payer: Self-pay | Admitting: Surgery

## 2022-07-07 VITALS — BP 161/88 | HR 61 | Temp 97.9°F | Resp 20 | Ht 66.0 in | Wt 237.0 lb

## 2022-07-07 DIAGNOSIS — I7143 Infrarenal abdominal aortic aneurysm, without rupture: Secondary | ICD-10-CM | POA: Diagnosis not present

## 2022-07-07 NOTE — Progress Notes (Signed)
[facility]  Patient name: Bryan Carter MRN: [id number] DOB: December 14, 1940 Sex: male   REASON FOR VISIT:    Follow up  HISOTRY OF PRESENT ILLNESS:    Bryan Carter is a 81 y.o. male who returns today for follow-up of his abdominal aortic aneurysm.  A CT scan in 2020 showed this to measure 4.4 cm.      CT scan  on 06/14/2021 showed a 4.9 cm AAA.  He is back today for follow-up.  He does state that he has chronic abdominal pain that is not associated with eating.  He denies any weight loss.  He denies back pain.     The patient has a history of prostate cancer.  He has a history of undergoing a colonoscopy complicated by perforation requiring colon resection.  This was done through a midline incision.  He also developed a hernia and has had a incisional hernia performed with mesh.   The patient has a history of coronary artery disease.  He has had a heart attack and has undergone coronary stenting 2.  He is medically managed for hypercholesterolemia with a statin.  He quit smoking approximately 8 years ago when he had his heart attack.  He does complain of bilateral leg swelling.  He does suffer from chronic renal insufficiency   PAST MEDICAL HISTORY:   Past Medical History:  Diagnosis Date   AAA (abdominal aortic aneurysm) (HCC)    Arthritis    "joints, back" (11/12/2017)   Coronary artery disease    Hyperlipidemia    Hypertension    Hypothyroidism    Myocardial infarction [facility]) 2008     FAMILY HISTORY:   Family History  Problem Relation Age of Onset   Heart attack Mother    Heart disease Mother    Heart attack Father    Heart disease Father     SOCIAL HISTORY:   Social History   Tobacco Use   Smoking status: Former    Packs/day: 1.00    Years: 50.00    Total pack years: 50.00    Types: Cigarettes    Quit date: 10/06/2006    Years since quitting: 15.7   Smokeless tobacco: Never  Substance Use Topics    Alcohol use: Yes    Alcohol/week: 0.0 standard drinks of alcohol    Comment: 11/12/2017  "might drink a beer once/year"     ALLERGIES:   Allergies  Allergen Reactions   Quinapril Shortness Of Breath and Cough     CURRENT MEDICATIONS:   Current Outpatient Medications  Medication Sig Dispense Refill   albuterol (VENTOLIN HFA) 108 (90 Base) MCG/ACT inhaler Inhale 2 puffs into the lungs every 6 (six) hours as needed for wheezing or shortness of breath.     amLODipine (NORVASC) 5 MG tablet Take 5 mg by mouth daily.     atorvastatin (LIPITOR) 80 MG tablet Take 80 mg by mouth daily.     carvedilol (COREG) 25 MG tablet Take 25 mg by mouth 2 (two) times daily.     Cyanocobalamin 5000 MCG TBDP Take 5,000 mcg by mouth daily.     finasteride (PROSCAR) 5 MG tablet Take 5 mg by mouth at bedtime.      furosemide (LASIX) 40 MG tablet Take 40 mg by mouth daily.     levothyroxine (SYNTHROID) 75 MCG tablet Take 75 mcg by mouth daily.     losartan (COZAAR) 100 MG tablet Take 100 mg by mouth daily.  Multiple Vitamin (MULTIVITAMIN) tablet Take 1 tablet daily by mouth.     Omega-3 Fatty Acids (FISH OIL PO) Take 1 capsule by mouth 2 (two) times daily.      spironolactone (ALDACTONE) 25 MG tablet Take 1 tablet (25 mg total) by mouth daily. 30 tablet 0   tamsulosin (FLOMAX) 0.4 MG CAPS capsule Take 0.4 mg by mouth at bedtime.      ticagrelor (BRILINTA) 90 MG TABS tablet Take 1 tablet (90 mg total) by mouth 2 (two) times daily. 60 tablet 3   nitroGLYCERIN (NITROSTAT) 0.4 MG SL tablet Place 1 tablet (0.4 mg total) under the tongue every 5 (five) minutes as needed for chest pain. 100 tablet 3   No current facility-administered medications for this visit.    REVIEW OF SYSTEMS:   [X]  denotes positive finding, [ ]  denotes negative finding Cardiac  Comments:  Chest pain or chest pressure:    Shortness of breath upon exertion:    Short of breath when lying flat:    Irregular heart rhythm:         Vascular    Pain in calf, thigh, or hip brought on by ambulation:    Pain in feet at night that wakes you up from your sleep:     Blood clot in your veins:    Leg swelling:         Pulmonary    Oxygen at home:    Productive cough:     Wheezing:         Neurologic    Sudden weakness in arms or legs:     Sudden numbness in arms or legs:     Sudden onset of difficulty speaking or slurred speech:    Temporary loss of vision in one eye:     Problems with dizziness:         Gastrointestinal    Blood in stool:     Vomited blood:         Genitourinary    Burning when urinating:     Blood in urine:        Psychiatric    Major depression:         Hematologic    Bleeding problems:    Problems with blood clotting too easily:        Skin    Rashes or ulcers:        Constitutional    Fever or chills:      PHYSICAL EXAM:   Vitals:   07/07/22 1102  BP: (!) 161/88  Pulse: 61  Resp: 20  Temp: 97.9 F (36.6 C)  SpO2: 94%  Weight: 237 lb (107.5 kg)  Height: 5\' 6"  (1.676 m)    GENERAL: The patient is a well-nourished male, in no acute distress. The vital signs are documented above. CARDIAC: There is a regular rate and rhythm.  PULMONARY: Non-labored respirations MUSCULOSKELETAL: There are no major deformities or cyanosis. NEUROLOGIC: No focal weakness or paresthesias are detected. SKIN: There are no ulcers or rashes noted. PSYCHIATRIC: The patient has a normal affect.  STUDIES:   I have reviewed the following CT scan:  1. Stable 4.9 cm infrarenal abdominal aortic aneurysm. Guidelines recommend follow-up CT/MR every 6 months and vascular consultation(the patient is currently under the care of a vascular specialist/surgeon). Ref: J Am Coll Radiol [phone]. 2. 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 3. Origin stenosis of celiac axis, origin occlusion of the IMA.  MEDICAL ISSUES:  AAA: Maximum diameter remains  4.9cm.  He will return in 6 months for ultrasound of his aorta and carotid    Leia Alf, MD, FACS [facility] [phone] Pager [phone]

## 2022-07-08 ENCOUNTER — Other Ambulatory Visit: Payer: Self-pay

## 2022-07-08 DIAGNOSIS — I6523 Occlusion and stenosis of bilateral carotid arteries: Secondary | ICD-10-CM

## 2022-07-08 DIAGNOSIS — I714 Abdominal aortic aneurysm, without rupture, unspecified: Secondary | ICD-10-CM

## 2023-02-09 ENCOUNTER — Ambulatory Visit (HOSPITAL_COMMUNITY)
Admission: RE | Admit: 2023-02-09 | Discharge: 2023-02-09 | Disposition: A | Discharge status: Home / Self Care | Payer: Medicare Other | Source: Ambulatory Visit | Attending: Surgery | Admitting: Surgery

## 2023-02-09 ENCOUNTER — Ambulatory Visit: Payer: Medicare Other | Admitting: Surgery

## 2023-02-09 ENCOUNTER — Encounter: Payer: Self-pay | Admitting: Surgery

## 2023-02-09 ENCOUNTER — Ambulatory Visit (INDEPENDENT_AMBULATORY_CARE_PROVIDER_SITE_OTHER)
Admission: RE | Admit: 2023-02-09 | Discharge: 2023-02-09 | Disposition: A | Discharge status: Home / Self Care | Payer: Medicare Other | Source: Ambulatory Visit | Attending: Surgery | Admitting: Surgery

## 2023-02-09 VITALS — BP 137/83 | HR 58 | Temp 97.4°F | Resp 20 | Ht 66.0 in | Wt 228.0 lb

## 2023-02-09 DIAGNOSIS — I7143 Infrarenal abdominal aortic aneurysm, without rupture: Secondary | ICD-10-CM

## 2023-02-09 DIAGNOSIS — I714 Abdominal aortic aneurysm, without rupture, unspecified: Secondary | ICD-10-CM | POA: Diagnosis not present

## 2023-02-09 DIAGNOSIS — I6523 Occlusion and stenosis of bilateral carotid arteries: Secondary | ICD-10-CM | POA: Insufficient documentation

## 2023-02-09 NOTE — H&P (View-Only) (Signed)
 [facility]  Patient name: Bryan Carter MRN: [id number] DOB: 08/29/1941 Sex: male   REASON FOR VISIT:    Follow up  HISOTRY OF PRESENT ILLNESS:   Bryan Carter is a 81 y.o. male who returns today for follow-up of his abdominal aortic aneurysm.  A CT scan in 2020 showed this to measure 4.4 cm.      CT scan  on 06/14/2021 showed a 4.9 cm AAA.  CTA 6 months ago showed maximum  diameter was 4.9 cm.  He is back today for follow-up.  He denies any weight loss.  He denies back pain.     The patient has a history of prostate cancer.  He has a history of undergoing a colonoscopy complicated by perforation requiring colon resection.  This was done through a midline incision.  He also developed a hernia and has had a incisional hernia performed with mesh.   The patient has a history of coronary artery disease.  He has had a heart attack and has undergone coronary stenting 2.  He is medically managed for hypercholesterolemia with a statin.  He quit smoking approximately 8 years ago when he had his heart attack.  He does complain of bilateral leg swelling.  He does suffer from chronic renal insufficiency  PAST MEDICAL HISTORY:   Past Medical History:  Diagnosis Date   AAA (abdominal aortic aneurysm) (HCC)    Arthritis    "joints, back" (11/12/2017)   Coronary artery disease    Hyperlipidemia    Hypertension    Hypothyroidism    Myocardial infarction (HCC) 2008     FAMILY HISTORY:   Family History  Problem Relation Age of Onset   Heart attack Mother    Heart disease Mother    Heart attack Father    Heart disease Father     SOCIAL HISTORY:   Social History   Tobacco Use   Smoking status: Former    Packs/day: 1.00    Years: 50.00    Additional pack years: 0.00    Total pack years: 50.00    Types: Cigarettes    Quit date: 10/06/2006    Years since quitting: 16.3   Smokeless tobacco: Never  Substance Use Topics    Alcohol use: Yes    Alcohol/week: 0.0 standard drinks of alcohol    Comment: 11/12/2017  "might drink a beer once/year"     ALLERGIES:   Allergies  Allergen Reactions   Quinapril Shortness Of Breath and Cough     CURRENT MEDICATIONS:   Current Outpatient Medications  Medication Sig Dispense Refill   albuterol (VENTOLIN HFA) 108 (90 Base) MCG/ACT inhaler Inhale 2 puffs into the lungs every 6 (six) hours as needed for wheezing or shortness of breath.     amLODipine (NORVASC) 5 MG tablet Take 5 mg by mouth daily.     atorvastatin (LIPITOR) 80 MG tablet Take 80 mg by mouth daily.     carvedilol (COREG) 25 MG tablet Take 25 mg by mouth 2 (two) times daily.     Cyanocobalamin 5000 MCG TBDP Take 5,000 mcg by mouth daily.     finasteride (PROSCAR) 5 MG tablet Take 5 mg by mouth at bedtime.      furosemide (LASIX) 40 MG tablet Take 40 mg by mouth daily.     levothyroxine (SYNTHROID) 75 MCG tablet Take 75 mcg by mouth daily.     losartan (COZAAR) 100 MG tablet Take 100 mg by mouth daily.       Multiple Vitamin (MULTIVITAMIN) tablet Take 1 tablet daily by mouth.     Omega-3 Fatty Acids (FISH OIL PO) Take 1 capsule by mouth 2 (two) times daily.      spironolactone (ALDACTONE) 25 MG tablet Take 1 tablet (25 mg total) by mouth daily. 30 tablet 0   tamsulosin (FLOMAX) 0.4 MG CAPS capsule Take 0.4 mg by mouth at bedtime.      ticagrelor (BRILINTA) 90 MG TABS tablet Take 1 tablet (90 mg total) by mouth 2 (two) times daily. 60 tablet 3   nitroGLYCERIN (NITROSTAT) 0.4 MG SL tablet Place 1 tablet (0.4 mg total) under the tongue every 5 (five) minutes as needed for chest pain. 100 tablet 3   No current facility-administered medications for this visit.    REVIEW OF SYSTEMS:   [X] denotes positive finding, [ ] denotes negative finding Cardiac  Comments:  Chest pain or chest pressure:    Shortness of breath upon exertion:    Short of breath when lying flat:    Irregular heart rhythm:         Vascular    Pain in calf, thigh, or hip brought on by ambulation:    Pain in feet at night that wakes you up from your sleep:     Blood clot in your veins:    Leg swelling:  x       Pulmonary    Oxygen at home:    Productive cough:     Wheezing:         Neurologic    Sudden weakness in arms or legs:     Sudden numbness in arms or legs:     Sudden onset of difficulty speaking or slurred speech:    Temporary loss of vision in one eye:     Problems with dizziness:         Gastrointestinal    Blood in stool:     Vomited blood:         Genitourinary    Burning when urinating:     Blood in urine:        Psychiatric    Major depression:         Hematologic    Bleeding problems:    Problems with blood clotting too easily:        Skin    Rashes or ulcers:        Constitutional    Fever or chills:      PHYSICAL EXAM:   Vitals:   02/09/23 0858 02/09/23 0900  BP: (!) 148/74 137/83  Pulse: (!) 58   Resp: 20   Temp: (!) 97.4 F (36.3 C)   SpO2: 95%   Weight: 228 lb (103.4 kg)   Height: 5' 6" (1.676 m)     GENERAL: The patient is a well-nourished male, in no acute distress. The vital signs are documented above. CARDIAC: There is a regular rate and rhythm.  PULMONARY: Non-labored respirations ABDOMEN: Soft and non-tender MUSCULOSKELETAL: There are no major deformities or cyanosis. NEUROLOGIC: No focal weakness or paresthesias are detected. SKIN: There are no ulcers or rashes noted. PSYCHIATRIC: The patient has a normal affect.  STUDIES:   I have reviewed the following: ABI/TBIToday's ABIToday's TBIPrevious ABIPrevious TBI  +-------+-----------+-----------+------------+------------+  Right 0.70       0.64       0.78        0.43          +-------+-----------+-----------+------------+------------+  Left  0.58         0.40       0.70        0.49          +-------+-----------+-----------+------------+------------+    Right Carotid: Velocities in  the right ICA are consistent with a 1-39%  stenosis.                Unable to obtain higher velocities as noted on previous  exam.   Left Carotid: Velocities in the left ICA are consistent with a 1-39%  stenosis.   Vertebrals: Bilateral vertebral arteries demonstrate antegrade flow.  Subclavians: Normal flow hemodynamics were seen in bilateral subclavian               arteries.    Abdominal Aorta: There is evidence of abnormal dilatation of the mid and  distal Abdominal aorta. The largest aortic diameter has increased compared  to prior exam. See above for comparison.  Maximum diameter is 5.6 cm    MEDICAL ISSUES:   AAA: Maximum diameter is 5.6 cm.  We discussed proceeding with endovascular repair.  I discussed the details the operation with the patient and his daughter.  We also discussed the risk including but not limited to distal embolization, stroke, heart attack, death, renal insufficiency, intestinal ischemia.  All questions were answered.  Will work to arrange this in the near future.  I am asking for cardiac clearance from his cardiologist, Dr. Harwani    Bryan Carter, IV, MD, FACS [facility] Tel [phone] Pager [phone]  

## 2023-02-09 NOTE — Progress Notes (Signed)
[facility] [place]  Patient name: Bryan Carter MRN: [id number] DOB: [birth year] Sex: male   REASON FOR VISIT:    Follow up  HISOTRY OF PRESENT ILLNESS:   Bryan Carter is a 82 y.o. male who returns today for follow-up of his abdominal aortic aneurysm.  A CT scan in 2020 showed this to measure 4.4 cm.      CT scan  on 06/14/2021 showed a 4.9 cm AAA.  CTA 6 months ago showed maximum  diameter was 4.9 cm.  He is back today for follow-up.  He denies any weight loss.  He denies back pain.     The patient has a history of prostate cancer.  He has a history of undergoing a colonoscopy complicated by perforation requiring colon resection.  This was done through a midline incision.  He also developed a hernia and has had a incisional hernia performed with mesh.   The patient has a history of coronary artery disease.  He has had a heart attack and has undergone coronary stenting 2.  He is medically managed for hypercholesterolemia with a statin.  He quit smoking approximately 8 years ago when he had his heart attack.  He does complain of bilateral leg swelling.  He does suffer from chronic renal insufficiency  PAST MEDICAL HISTORY:   Past Medical History:  Diagnosis Date   AAA (abdominal aortic aneurysm) (HCC)    Arthritis    "joints, back" (11/12/2017)   Coronary artery disease    Hyperlipidemia    Hypertension    Hypothyroidism    Myocardial infarction [facility]) 2008     FAMILY HISTORY:   Family History  Problem Relation Age of Onset   Heart attack Mother    Heart disease Mother    Heart attack Father    Heart disease Father     SOCIAL HISTORY:   Social History   Tobacco Use   Smoking status: Former    Packs/day: 1.00    Years: 50.00    Additional pack years: 0.00    Total pack years: 50.00    Types: Cigarettes    Quit date: 10/06/2006    Years since quitting: 16.3   Smokeless tobacco: Never  Substance Use Topics    Alcohol use: Yes    Alcohol/week: 0.0 standard drinks of alcohol    Comment: 11/12/2017  "might drink a beer once/year"     ALLERGIES:   Allergies  Allergen Reactions   Quinapril Shortness Of Breath and Cough     CURRENT MEDICATIONS:   Current Outpatient Medications  Medication Sig Dispense Refill   albuterol (VENTOLIN HFA) 108 (90 Base) MCG/ACT inhaler Inhale 2 puffs into the lungs every 6 (six) hours as needed for wheezing or shortness of breath.     amLODipine (NORVASC) 5 MG tablet Take 5 mg by mouth daily.     atorvastatin (LIPITOR) 80 MG tablet Take 80 mg by mouth daily.     carvedilol (COREG) 25 MG tablet Take 25 mg by mouth 2 (two) times daily.     Cyanocobalamin 5000 MCG TBDP Take 5,000 mcg by mouth daily.     finasteride (PROSCAR) 5 MG tablet Take 5 mg by mouth at bedtime.      furosemide (LASIX) 40 MG tablet Take 40 mg by mouth daily.     levothyroxine (SYNTHROID) 75 MCG tablet Take 75 mcg by mouth daily.     losartan (COZAAR) 100 MG tablet Take 100 mg by mouth daily.  Multiple Vitamin (MULTIVITAMIN) tablet Take 1 tablet daily by mouth.     Omega-3 Fatty Acids (FISH OIL PO) Take 1 capsule by mouth 2 (two) times daily.      spironolactone (ALDACTONE) 25 MG tablet Take 1 tablet (25 mg total) by mouth daily. 30 tablet 0   tamsulosin (FLOMAX) 0.4 MG CAPS capsule Take 0.4 mg by mouth at bedtime.      ticagrelor (BRILINTA) 90 MG TABS tablet Take 1 tablet (90 mg total) by mouth 2 (two) times daily. 60 tablet 3   nitroGLYCERIN (NITROSTAT) 0.4 MG SL tablet Place 1 tablet (0.4 mg total) under the tongue every 5 (five) minutes as needed for chest pain. 100 tablet 3   No current facility-administered medications for this visit.    REVIEW OF SYSTEMS:   [X]  denotes positive finding, [ ]  denotes negative finding Cardiac  Comments:  Chest pain or chest pressure:    Shortness of breath upon exertion:    Short of breath when lying flat:    Irregular heart rhythm:         Vascular    Pain in calf, thigh, or hip brought on by ambulation:    Pain in feet at night that wakes you up from your sleep:     Blood clot in your veins:    Leg swelling:  x       Pulmonary    Oxygen at home:    Productive cough:     Wheezing:         Neurologic    Sudden weakness in arms or legs:     Sudden numbness in arms or legs:     Sudden onset of difficulty speaking or slurred speech:    Temporary loss of vision in one eye:     Problems with dizziness:         Gastrointestinal    Blood in stool:     Vomited blood:         Genitourinary    Burning when urinating:     Blood in urine:        Psychiatric    Major depression:         Hematologic    Bleeding problems:    Problems with blood clotting too easily:        Skin    Rashes or ulcers:        Constitutional    Fever or chills:      PHYSICAL EXAM:   Vitals:   02/09/23 0858 02/09/23 0900  BP: (!) 148/74 137/83  Pulse: (!) 58   Resp: 20   Temp: (!) 97.4 F (36.3 C)   SpO2: 95%   Weight: 228 lb (103.4 kg)   Height: 5\' 6"  (1.676 m)     GENERAL: The patient is a well-nourished male, in no acute distress. The vital signs are documented above. CARDIAC: There is a regular rate and rhythm.  PULMONARY: Non-labored respirations ABDOMEN: Soft and non-tender MUSCULOSKELETAL: There are no major deformities or cyanosis. NEUROLOGIC: No focal weakness or paresthesias are detected. SKIN: There are no ulcers or rashes noted. PSYCHIATRIC: The patient has a normal affect.  STUDIES:   I have reviewed the following: ABI/TBIToday's ABIToday's TBIPrevious ABIPrevious TBI  +-------+-----------+-----------+------------+------------+  Right 0.70       0.64       0.78        0.43          +-------+-----------+-----------+------------+------------+  Left  0.58  0.40       0.70        0.49          +-------+-----------+-----------+------------+------------+    Right Carotid: Velocities in  the right ICA are consistent with a 1-39%  stenosis.                Unable to obtain higher velocities as noted on previous  exam.   Left Carotid: Velocities in the left ICA are consistent with a 1-39%  stenosis.   Vertebrals: Bilateral vertebral arteries demonstrate antegrade flow.  Subclavians: Normal flow hemodynamics were seen in bilateral subclavian               arteries.    Abdominal Aorta: There is evidence of abnormal dilatation of the mid and  distal Abdominal aorta. The largest aortic diameter has increased compared  to prior exam. See above for comparison.  Maximum diameter is 5.6 cm    MEDICAL ISSUES:   AAA: Maximum diameter is 5.6 cm.  We discussed proceeding with endovascular repair.  I discussed the details the operation with the patient and his daughter.  We also discussed the risk including but not limited to distal embolization, stroke, heart attack, death, renal insufficiency, intestinal ischemia.  All questions were answered.  Will work to arrange this in the near future.  I am asking for cardiac clearance from his cardiologist, Dr. Marrian Salvage, MD, FACS [facility] [phone] Pager [phone]

## 2023-02-17 ENCOUNTER — Other Ambulatory Visit: Payer: Self-pay

## 2023-02-17 DIAGNOSIS — I7143 Infrarenal abdominal aortic aneurysm, without rupture: Secondary | ICD-10-CM

## 2023-02-19 NOTE — Progress Notes (Signed)
Surgical Instructions    Your procedure is scheduled on Wednesday, 02/25/23.  Report to [facility] Main Entrance "A" at 5:30 A.M., then check in with the Admitting office.  Call this number if you have problems the morning of surgery:  [phone]   If you have any questions prior to your surgery date call [phone]: Open Monday-Friday 8am-4pm If you experience any cold or flu symptoms such as cough, fever, chills, shortness of breath, etc. between now and your scheduled surgery, please notify us at the above number     Remember:  Do not eat or drink after midnight the night before your surgery    Take these medicines the morning of surgery with A SIP OF WATER:  amLODipine (NORVASC)  atorvastatin (LIPITOR)  carvedilol (COREG)  levothyroxine (SYNTHROID)   IF NEEDED: albuterol (VENTOLIN HFA) inhaler- bring with you the day of surgery   As of today, STOP taking any Aspirin (unless otherwise instructed by your surgeon) Aleve, Naproxen, Ibuprofen, Motrin, Advil, Goody's, BC's, all herbal medications, fish oil, and all vitamins.  Please follow your surgeons instructions in regards to stopping or continuing ticagrelor (BRILINTA). If no instructions were given to you, please reach out to the office.            Do not wear jewelry or makeup. Do not wear lotions, powders, cologne or deodorant. Men may shave face and neck. Do not bring valuables to the hospital. Do not wear nail polish, gel polish, artificial nails, or any other type of covering on natural nails (fingers and toes) If you have artificial nails or gel coating that need to be removed by a nail salon, please have this removed prior to surgery. Artificial nails or gel coating may interfere with anesthesia's ability to adequately monitor your vital signs.  [facility] is not responsible for any belongings or valuables.    Do NOT Smoke (Tobacco/Vaping)  24 hours prior to your procedure  If you use a CPAP at night, you may  bring your mask for your overnight stay.   Contacts, glasses, hearing aids, dentures or partials may not be worn into surgery, please bring cases for these belongings   For patients admitted to the hospital, discharge time will be determined by your treatment team.   Patients discharged the day of surgery will not be allowed to drive home, and someone needs to stay with them for 24 hours.   SURGICAL WAITING ROOM VISITATION Patients having surgery or a procedure may have no more than 2 support people in the waiting area - these visitors may rotate.   Children under the age of 54 must have an adult with them who is not the patient. If the patient needs to stay at the hospital during part of their recovery, the visitor guidelines for inpatient rooms apply. Pre-op nurse will coordinate an appropriate time for 1 support person to accompany patient in pre-op.  This support person may not rotate.   Please refer to [email or web address] for the visitor guidelines for Inpatients (after your surgery is over and you are in a regular room).    Special instructions:    Oral Hygiene is also important to reduce your risk of infection.  Remember - BRUSH YOUR TEETH THE MORNING OF SURGERY WITH YOUR REGULAR TOOTHPASTE   [place]- Preparing For Surgery  Before surgery, you can play an important role. Because skin is not sterile, your skin needs to be as free of germs as possible. You can reduce the number of  germs on your skin by washing with CHG (chlorahexidine gluconate) Soap before surgery.  CHG is an antiseptic cleaner which kills germs and bonds with the skin to continue killing germs even after washing.     Please do not use if you have an allergy to CHG or antibacterial soaps. If your skin becomes reddened/irritated stop using the CHG.  Do not shave (including legs and underarms) for at least 48 hours prior to first CHG shower. It is OK to shave your  face.  Please follow these instructions carefully.     Shower the NIGHT BEFORE SURGERY and the MORNING OF SURGERY with CHG Soap.   If you chose to wash your hair, wash your hair first as usual with your normal shampoo. After you shampoo, rinse your hair and body thoroughly to remove the shampoo.  Then [employer] and genitals (private parts) with your normal soap and rinse thoroughly to remove soap.  After that Use CHG Soap as you would any other liquid soap. You can apply CHG directly to the skin and wash gently with a scrungie or a clean washcloth.   Apply the CHG Soap to your body ONLY FROM THE NECK DOWN.  Do not use on open wounds or open sores. Avoid contact with your eyes, ears, mouth and genitals (private parts). Wash Face and genitals (private parts)  with your normal soap.   Wash thoroughly, paying special attention to the area where your surgery will be performed.  Thoroughly rinse your body with warm water from the neck down.  DO NOT shower/wash with your normal soap after using and rinsing off the CHG Soap.  Pat yourself dry with a CLEAN TOWEL.  Wear CLEAN PAJAMAS to bed the night before surgery  Place CLEAN SHEETS on your bed the night before your surgery  DO NOT SLEEP WITH PETS.   Day of Surgery: Take a shower with CHG soap. Wear Clean/Comfortable clothing the morning of surgery Do not apply any deodorants/lotions.   Remember to brush your teeth WITH YOUR REGULAR TOOTHPASTE.    If you received a COVID test during your pre-op visit, it is requested that you wear a mask when out in public, stay away from anyone that may not be feeling well, and notify your surgeon if you develop symptoms. If you have been in contact with anyone that has tested positive in the last 10 days, please notify your surgeon.    Please read over the following fact sheets that you were given.

## 2023-02-20 ENCOUNTER — Encounter (HOSPITAL_COMMUNITY): Payer: Self-pay

## 2023-02-20 ENCOUNTER — Encounter (HOSPITAL_COMMUNITY)
Admission: RE | Admit: 2023-02-20 | Discharge: 2023-02-20 | Disposition: A | Discharge status: Home / Self Care | Payer: Medicare Other | Source: Ambulatory Visit | Attending: Surgery | Admitting: Surgery

## 2023-02-20 ENCOUNTER — Other Ambulatory Visit: Payer: Self-pay

## 2023-02-20 VITALS — BP 126/61 | HR 66 | Temp 98.2°F | Resp 19 | Ht 66.0 in | Wt 228.0 lb

## 2023-02-20 DIAGNOSIS — G4733 Obstructive sleep apnea (adult) (pediatric): Secondary | ICD-10-CM | POA: Insufficient documentation

## 2023-02-20 DIAGNOSIS — I491 Atrial premature depolarization: Secondary | ICD-10-CM | POA: Insufficient documentation

## 2023-02-20 DIAGNOSIS — I2510 Atherosclerotic heart disease of native coronary artery without angina pectoris: Secondary | ICD-10-CM | POA: Insufficient documentation

## 2023-02-20 DIAGNOSIS — N189 Chronic kidney disease, unspecified: Secondary | ICD-10-CM | POA: Diagnosis not present

## 2023-02-20 DIAGNOSIS — I129 Hypertensive chronic kidney disease with stage 1 through stage 4 chronic kidney disease, or unspecified chronic kidney disease: Secondary | ICD-10-CM | POA: Insufficient documentation

## 2023-02-20 DIAGNOSIS — I7143 Infrarenal abdominal aortic aneurysm, without rupture: Secondary | ICD-10-CM

## 2023-02-20 DIAGNOSIS — E785 Hyperlipidemia, unspecified: Secondary | ICD-10-CM | POA: Diagnosis not present

## 2023-02-20 DIAGNOSIS — Z01818 Encounter for other preprocedural examination: Secondary | ICD-10-CM

## 2023-02-20 DIAGNOSIS — I252 Old myocardial infarction: Secondary | ICD-10-CM | POA: Insufficient documentation

## 2023-02-20 DIAGNOSIS — I10 Essential (primary) hypertension: Secondary | ICD-10-CM

## 2023-02-20 DIAGNOSIS — Z955 Presence of coronary angioplasty implant and graft: Secondary | ICD-10-CM | POA: Insufficient documentation

## 2023-02-20 LAB — TYPE AND SCREEN
ABO/RH(D): O POS
Antibody Screen: NEGATIVE

## 2023-02-20 LAB — URINALYSIS, ROUTINE W REFLEX MICROSCOPIC
Bacteria, UA: NONE SEEN
Bilirubin Urine: NEGATIVE
Glucose, UA: NEGATIVE mg/dL
Hgb urine dipstick: NEGATIVE
Ketones, ur: NEGATIVE mg/dL
Leukocytes,Ua: NEGATIVE
Nitrite: NEGATIVE
Protein, ur: 30 mg/dL — AB
Specific Gravity, Urine: 1.017 (ref 1.005–1.030)
pH: 5 (ref 5.0–8.0)

## 2023-02-20 LAB — SURGICAL PCR SCREEN
MRSA, PCR: NEGATIVE
Staphylococcus aureus: NEGATIVE

## 2023-02-20 LAB — CBC
HCT: 34.8 % — ABNORMAL LOW (ref 39.0–52.0)
Hemoglobin: 11.1 g/dL — ABNORMAL LOW (ref 13.0–17.0)
MCH: 29.7 pg (ref 26.0–34.0)
MCHC: 31.9 g/dL (ref 30.0–36.0)
MCV: 93 fL (ref 80.0–100.0)
Platelets: 212 10*3/uL (ref 150–400)
RBC: 3.74 MIL/uL — ABNORMAL LOW (ref 4.22–5.81)
RDW: 15.6 % — ABNORMAL HIGH (ref 11.5–15.5)
WBC: 6 10*3/uL (ref 4.0–10.5)
nRBC: 0 % (ref 0.0–0.2)

## 2023-02-20 LAB — COMPREHENSIVE METABOLIC PANEL
ALT: 21 U/L (ref 0–44)
AST: 25 U/L (ref 15–41)
Albumin: 3.2 g/dL — ABNORMAL LOW (ref 3.5–5.0)
Alkaline Phosphatase: 75 U/L (ref 38–126)
Anion gap: 10 (ref 5–15)
BUN: 31 mg/dL — ABNORMAL HIGH (ref 8–23)
CO2: 19 mmol/L — ABNORMAL LOW (ref 22–32)
Calcium: 9 mg/dL (ref 8.9–10.3)
Chloride: 107 mmol/L (ref 98–111)
Creatinine, Ser: 1.72 mg/dL — ABNORMAL HIGH (ref 0.61–1.24)
GFR, Estimated: 39 mL/min — ABNORMAL LOW (ref >60)
Glucose, Bld: 127 mg/dL — ABNORMAL HIGH (ref 70–99)
Potassium: 3.9 mmol/L (ref 3.5–5.1)
Sodium: 136 mmol/L (ref 135–145)
Total Bilirubin: 0.5 mg/dL (ref 0.3–1.2)
Total Protein: 7 g/dL (ref 6.5–8.1)

## 2023-02-20 LAB — APTT: aPTT: 29 seconds (ref 24–36)

## 2023-02-20 LAB — PROTIME-INR
INR: 1.2 (ref 0.8–1.2)
Prothrombin Time: 15.2 seconds (ref 11.4–15.2)

## 2023-02-20 NOTE — Progress Notes (Signed)
PCP - Fleet Contras Pulmonologist -Sood, Vene[place]  Cardiologist - Rinaldo Cloud  PPM/ICD - denies Device Orders - n/a Rep Notified - n/a  Chest x-ray - denies EKG - 02/20/2023 Stress Test - 11/06/2017 ECHO - 08/06/2016 Cardiac Cath -11/12/2017   Sleep Study - yes; 2019 CPAP - yes  Fasting Blood Sugar - no DM   Last dose of GLP1 agonist-  n/a GLP1 instructions: n/a  Blood Thinner Instructions: last dose of ticagrelor (BRILINTA) on 05/16 per dr's order Aspirin Instructions:n/a  ERAS Protcol -no; NPO at midnight PRE-SURGERY Ensure or G2- no  COVID TEST- n/a   Anesthesia review: yes (abnormal EKG)  Patient denies shortness of breath, fever, cough and chest pain at PAT appointment and the last 2 months.   All instructions explained to the patient, with a verbal understanding of the material. Patient agrees to go over the instructions while at home for a better understanding. Patient also instructed to self quarantine after being tested for COVID-19. The opportunity to ask questions was provided.

## 2023-02-23 NOTE — Anesthesia Preprocedure Evaluation (Signed)
Anesthesia Evaluation  Patient identified by MRN, date of birth, ID band Patient awake    Reviewed: Allergy & Precautions, NPO status , Patient's Chart, lab work & pertinent test results  History of Anesthesia Complications Negative for: history of anesthetic complications  Airway Mallampati: I  TM Distance: >3 FB Neck ROM: Full    Dental  (+) Edentulous Upper, Edentulous Lower, Dental Advisory Given   Pulmonary sleep apnea and Continuous Positive Airway Pressure Ventilation , former smoker   Pulmonary exam normal        Cardiovascular hypertension, Pt. on home beta blockers and Pt. on medications + CAD and + Cardiac Stents  Normal cardiovascular exam     Neuro/Psych negative neurological ROS     GI/Hepatic negative GI ROS, Neg liver ROS,,,  Endo/Other  Hypothyroidism    Renal/GU Renal InsufficiencyRenal disease     Musculoskeletal negative musculoskeletal ROS (+)    Abdominal   Peds  Hematology negative hematology ROS (+)   Anesthesia Other Findings   Reproductive/Obstetrics                             Anesthesia Physical Anesthesia Plan  ASA: 3  Anesthesia Plan: General   Post-op Pain Management: Tylenol PO (pre-op)* and Minimal or no pain anticipated   Induction: Intravenous  PONV Risk Score and Plan: 2 and Ondansetron and Dexamethasone  Airway Management Planned: Oral ETT  Additional Equipment: Arterial line  Intra-op Plan:   Post-operative Plan: Extubation in OR  Informed Consent: I have reviewed the patients History and Physical, chart, labs and discussed the procedure including the risks, benefits and alternatives for the proposed anesthesia with the patient or authorized representative who has indicated his/her understanding and acceptance.     Dental advisory given  Plan Discussed with: Anesthesiologist and CRNA  Anesthesia Plan Comments: (PAT note by Antionette Poles, PA-C: Follows with cardiologist Dr. Sharyn Lull for history of HTN, HLD, OSA on CPAP, CAD s/p MI February 2001 s/p PCI to LAD in the past, s/p PCI to left circumflex and obtuse marginal in January 2018 followed by restenosis of left circumflex and obtuse marginal requiring PCI in February 2019.  Last seen by Dr. Sharyn Lull 01/28/2023 and noted to be stable at that time.  Also cleared for upcoming vascular surgery.  Per note, "patient has not used any nitroglycerin since last office visit noted to have progressive increasing size of abdominal aortic aneurysm being followed by vascular surgery.  Patient is acceptable risk for EVAR from cardiac point of view."  Patient reports last dose of Brilinta 02/19/2023.  History of CKD 3  Preop labs reviewed, creatinine elevated 1.72 consistent with history of CKD, mild anemia with hemoglobin 11.1, otherwise unremarkable.  EKG 02/20/2023: Sinus rhythm with PACs.  Rate 62.  Carotid duplex 02/09/2023: Summary:  Right Carotid: Velocities in the right ICA are consistent with a 1-39% stenosis. Unable to obtain higher velocities as noted on previous exam.  Left Carotid: Velocities in the left ICA are consistent with a 1-39% stenosis.  Vertebrals:Bilateral vertebral arteries demonstrate antegrade flow.  Subclavians: Normal flow hemodynamics were seen in bilateral subclavian arteries.   Cath and PCI 11/12/2017: ? Previously placed Ost 1st Mrg stent (unknown type) is widely patent. ? Previously placed Ost Cx to Prox Cx stent (unknown type) is widely patent. ? Prox RCA lesion is 40% stenosed. ? Mid RCA lesion is 40% stenosed. ? Dist RCA lesion is 65% stenosed. ? Ost LAD  lesion is 50% stenosed. ? Prox LAD lesion is 30% stenosed. ? Prox Cx lesion is 80% stenosed. ? Ost 1st Mrg to 1st Mrg lesion is 80% stenosed. ? Post intervention, there is a 0% residual stenosis. ? Balloon angioplasty was performed using a BALLOON EMERGE MR 2.5X8. ? Post intervention, there is a 10%  residual stenosis. ? Scoring balloon angioplasty was performed using a BALLOON WOLVERINE 3.00X6.  TTE 08/06/2016: - Left ventricle: The cavity size was normal. Wall thickness was  increased in a pattern of mild LVH. Systolic function was normal.  The estimated ejection fraction was in the range of 50% to 55%.  Wall motion was normal; there were no regional wall motion  abnormalities. Doppler parameters are consistent with abnormal  left ventricular relaxation (grade 1 diastolic dysfunction).  - Mitral valve: There was mild regurgitation.     )        Anesthesia Quick Evaluation

## 2023-02-23 NOTE — Progress Notes (Signed)
Anesthesia Chart Review:  Follows with cardiologist Dr. Sharyn Lull for history of HTN, HLD, OSA on CPAP, CAD s/p MI February 2001 s/p PCI to LAD in the past, s/p PCI to left circumflex and obtuse marginal in January 2018 followed by restenosis of left circumflex and obtuse marginal requiring PCI in February 2019.  Last seen by Dr. Sharyn Lull 01/28/2023 and noted to be stable at that time.  Also cleared for upcoming vascular surgery.  Per note, "patient has not used any nitroglycerin since last office visit noted to have progressive increasing size of abdominal aortic aneurysm being followed by vascular surgery.  Patient is acceptable risk for EVAR from cardiac point of view."  Patient reports last dose of Brilinta 02/19/2023.  History of CKD 3  Preop labs reviewed, creatinine elevated 1.72 consistent with history of CKD, mild anemia with hemoglobin 11.1, otherwise unremarkable.  EKG 02/20/2023: Sinus rhythm with PACs.  Rate 62.  Carotid duplex 02/09/2023: Summary:  Right Carotid: Velocities in the right ICA are consistent with a 1-39% stenosis. Unable to obtain higher velocities as noted on previous exam.  Left Carotid: Velocities in the left ICA are consistent with a 1-39% stenosis.  Vertebrals: Bilateral vertebral arteries demonstrate antegrade flow.  Subclavians: Normal flow hemodynamics were seen in bilateral subclavian arteries.   Cath and PCI 11/12/2017: Previously placed Ost 1st Mrg stent (unknown type) is widely patent. Previously placed Ost Cx to Prox Cx stent (unknown type) is widely patent. Prox RCA lesion is 40% stenosed. Mid RCA lesion is 40% stenosed. Dist RCA lesion is 65% stenosed. Ost LAD lesion is 50% stenosed. Prox LAD lesion is 30% stenosed. Prox Cx lesion is 80% stenosed. Ost 1st Mrg to 1st Mrg lesion is 80% stenosed. Post intervention, there is a 0% residual stenosis. Balloon angioplasty was performed using a BALLOON EMERGE MR 2.5X8. Post intervention, there is a 10% residual  stenosis. Scoring balloon angioplasty was performed using a BALLOON WOLVERINE 3.00X6.  TTE 08/06/2016: - Left ventricle: The cavity size was normal. Wall thickness was    increased in a pattern of mild LVH. Systolic function was normal.    The estimated ejection fraction was in the range of 50% to 55%.    Wall motion was normal; there were no regional wall motion    abnormalities. Doppler parameters are consistent with abnormal    left ventricular relaxation (grade 1 diastolic dysfunction).  - Mitral valve: There was mild regurgitation.    Zannie Cove [facility] Short Stay Center/Anesthesiology Phone [phone] 02/23/2023 1:40 PM

## 2023-02-25 ENCOUNTER — Other Ambulatory Visit: Payer: Self-pay

## 2023-02-25 ENCOUNTER — Inpatient Hospital Stay (HOSPITAL_COMMUNITY): Payer: Medicare Other | Admitting: Physician Assistant

## 2023-02-25 ENCOUNTER — Inpatient Hospital Stay (HOSPITAL_COMMUNITY)
Admission: RE | Admit: 2023-02-25 | Discharge: 2023-02-26 | DRG: 269 | Disposition: A | Discharge status: Home Health | Payer: Medicare Other | Source: Ambulatory Visit | Attending: Surgery | Admitting: Surgery

## 2023-02-25 ENCOUNTER — Inpatient Hospital Stay (HOSPITAL_COMMUNITY): Payer: Medicare Other | Admitting: Certified Registered"

## 2023-02-25 ENCOUNTER — Inpatient Hospital Stay (HOSPITAL_COMMUNITY): Payer: Medicare Other

## 2023-02-25 ENCOUNTER — Encounter (HOSPITAL_COMMUNITY)
Admission: RE | Disposition: A | Discharge status: Home Health | Payer: Self-pay | Source: Ambulatory Visit | Attending: Surgery

## 2023-02-25 ENCOUNTER — Encounter (HOSPITAL_COMMUNITY): Payer: Self-pay | Admitting: Surgery

## 2023-02-25 DIAGNOSIS — Z955 Presence of coronary angioplasty implant and graft: Secondary | ICD-10-CM

## 2023-02-25 DIAGNOSIS — Z79890 Hormone replacement therapy (postmenopausal): Secondary | ICD-10-CM

## 2023-02-25 DIAGNOSIS — I714 Abdominal aortic aneurysm, without rupture, unspecified: Secondary | ICD-10-CM | POA: Diagnosis present

## 2023-02-25 DIAGNOSIS — Z79899 Other long term (current) drug therapy: Secondary | ICD-10-CM

## 2023-02-25 DIAGNOSIS — Z87891 Personal history of nicotine dependence: Secondary | ICD-10-CM | POA: Diagnosis not present

## 2023-02-25 DIAGNOSIS — I10 Essential (primary) hypertension: Secondary | ICD-10-CM | POA: Diagnosis present

## 2023-02-25 DIAGNOSIS — Z7902 Long term (current) use of antithrombotics/antiplatelets: Secondary | ICD-10-CM | POA: Diagnosis not present

## 2023-02-25 DIAGNOSIS — E039 Hypothyroidism, unspecified: Secondary | ICD-10-CM | POA: Diagnosis present

## 2023-02-25 DIAGNOSIS — I2510 Atherosclerotic heart disease of native coronary artery without angina pectoris: Secondary | ICD-10-CM

## 2023-02-25 DIAGNOSIS — Z8249 Family history of ischemic heart disease and other diseases of the circulatory system: Secondary | ICD-10-CM

## 2023-02-25 DIAGNOSIS — Z888 Allergy status to other drugs, medicaments and biological substances status: Secondary | ICD-10-CM

## 2023-02-25 DIAGNOSIS — E7800 Pure hypercholesterolemia, unspecified: Secondary | ICD-10-CM | POA: Diagnosis present

## 2023-02-25 DIAGNOSIS — Z8546 Personal history of malignant neoplasm of prostate: Secondary | ICD-10-CM

## 2023-02-25 DIAGNOSIS — I252 Old myocardial infarction: Secondary | ICD-10-CM

## 2023-02-25 HISTORY — PX: ABDOMINAL AORTIC ENDOVASCULAR STENT GRAFT: SHX5707

## 2023-02-25 HISTORY — PX: ULTRASOUND GUIDANCE FOR VASCULAR ACCESS: SHX6516

## 2023-02-25 LAB — POCT ACTIVATED CLOTTING TIME
Activated Clotting Time: 212 seconds
Activated Clotting Time: 217 seconds
Activated Clotting Time: 223 seconds

## 2023-02-25 SURGERY — INSERTION, ENDOVASCULAR STENT GRAFT, AORTA, ABDOMINAL
Anesthesia: General | Site: Groin

## 2023-02-25 MED ORDER — ALUM & MAG HYDROXIDE-SIMETH 200-200-20 MG/5ML PO SUSP: 15.0000 mL | ORAL | Status: DC | PRN

## 2023-02-25 MED ORDER — ALBUTEROL SULFATE (2.5 MG/3ML) 0.083% IN NEBU: 3.0000 mL | INHALATION_SOLUTION | Freq: Four times a day (QID) | RESPIRATORY_TRACT | Status: DC | PRN

## 2023-02-25 MED ORDER — ROCURONIUM BROMIDE 10 MG/ML (PF) SYRINGE
PREFILLED_SYRINGE | INTRAVENOUS | Status: AC
  Filled 2023-02-25: qty 10

## 2023-02-25 MED ORDER — CARVEDILOL 25 MG PO TABS
25.0000 mg | ORAL_TABLET | Freq: Two times a day (BID) | ORAL | Status: DC
  Administered 2023-02-25 – 2023-02-26 (×2): 25 mg via ORAL
  Filled 2023-02-25 (×2): qty 1

## 2023-02-25 MED ORDER — CHLORHEXIDINE GLUCONATE 0.12 % MT SOLN: 15.0000 mL | Freq: Once | OROMUCOSAL | Status: AC

## 2023-02-25 MED ORDER — BISACODYL 5 MG PO TBEC: 5.0000 mg | DELAYED_RELEASE_TABLET | Freq: Every day | ORAL | Status: DC | PRN

## 2023-02-25 MED ORDER — PANTOPRAZOLE SODIUM 40 MG PO TBEC
40.0000 mg | DELAYED_RELEASE_TABLET | Freq: Every day | ORAL | Status: DC
  Administered 2023-02-25 – 2023-02-26 (×2): 40 mg via ORAL
  Filled 2023-02-25 (×2): qty 1

## 2023-02-25 MED ORDER — HEPARIN SODIUM (PORCINE) 5000 UNIT/ML IJ SOLN
5000.0000 [IU] | Freq: Three times a day (TID) | INTRAMUSCULAR | Status: DC
  Administered 2023-02-26: 5000 [IU] via SUBCUTANEOUS
  Filled 2023-02-25: qty 1

## 2023-02-25 MED ORDER — HEPARIN SODIUM (PORCINE) 1000 UNIT/ML IJ SOLN
INTRAMUSCULAR | Status: DC | PRN
  Administered 2023-02-25 (×2): 2000 [IU] via INTRAVENOUS
  Administered 2023-02-25: 10000 [IU] via INTRAVENOUS

## 2023-02-25 MED ORDER — PHENOL 1.4 % MT LIQD: 1.0000 | OROMUCOSAL | Status: DC | PRN

## 2023-02-25 MED ORDER — CEFAZOLIN SODIUM-DEXTROSE 2-4 GM/100ML-% IV SOLN
INTRAVENOUS | Status: AC
  Filled 2023-02-25: qty 100

## 2023-02-25 MED ORDER — DEXAMETHASONE SODIUM PHOSPHATE 10 MG/ML IJ SOLN
INTRAMUSCULAR | Status: DC | PRN
  Administered 2023-02-25: 5 mg via INTRAVENOUS

## 2023-02-25 MED ORDER — HYDRALAZINE HCL 20 MG/ML IJ SOLN: 5.0000 mg | INTRAMUSCULAR | Status: DC | PRN

## 2023-02-25 MED ORDER — HYDROMORPHONE HCL 1 MG/ML IJ SOLN: 0.5000 mg | INTRAMUSCULAR | Status: DC | PRN

## 2023-02-25 MED ORDER — LACTATED RINGERS IV SOLN: INTRAVENOUS | Status: DC

## 2023-02-25 MED ORDER — ACETAMINOPHEN 650 MG RE SUPP: 325.0000 mg | RECTAL | Status: DC | PRN

## 2023-02-25 MED ORDER — DEXAMETHASONE SODIUM PHOSPHATE 10 MG/ML IJ SOLN
INTRAMUSCULAR | Status: AC
  Filled 2023-02-25: qty 1

## 2023-02-25 MED ORDER — EPHEDRINE SULFATE-NACL 50-0.9 MG/10ML-% IV SOSY
PREFILLED_SYRINGE | INTRAVENOUS | Status: DC | PRN
  Administered 2023-02-25: 5 mg via INTRAVENOUS
  Administered 2023-02-25: 10 mg via INTRAVENOUS
  Administered 2023-02-25 (×2): 5 mg via INTRAVENOUS

## 2023-02-25 MED ORDER — CHLORHEXIDINE GLUCONATE 0.12 % MT SOLN
OROMUCOSAL | Status: AC
  Administered 2023-02-25: 15 mL via OROMUCOSAL
  Filled 2023-02-25: qty 15

## 2023-02-25 MED ORDER — IODIXANOL 320 MG/ML IV SOLN
INTRAVENOUS | Status: DC | PRN
  Administered 2023-02-25: 79.7 mL via INTRA_ARTERIAL

## 2023-02-25 MED ORDER — ONDANSETRON HCL 4 MG/2ML IJ SOLN
INTRAMUSCULAR | Status: DC | PRN
  Administered 2023-02-25: 4 mg via INTRAVENOUS

## 2023-02-25 MED ORDER — PHENYLEPHRINE HCL-NACL 20-0.9 MG/250ML-% IV SOLN
INTRAVENOUS | Status: DC | PRN
  Administered 2023-02-25: 50 ug/min via INTRAVENOUS

## 2023-02-25 MED ORDER — PROTAMINE SULFATE 10 MG/ML IV SOLN
INTRAVENOUS | Status: AC
  Filled 2023-02-25: qty 5

## 2023-02-25 MED ORDER — CEFAZOLIN SODIUM-DEXTROSE 2-4 GM/100ML-% IV SOLN
2.0000 g | INTRAVENOUS | Status: AC
  Administered 2023-02-25: 2 g via INTRAVENOUS

## 2023-02-25 MED ORDER — SUGAMMADEX SODIUM 200 MG/2ML IV SOLN
INTRAVENOUS | Status: DC | PRN
  Administered 2023-02-25: 200 mg via INTRAVENOUS

## 2023-02-25 MED ORDER — LACTATED RINGERS IV SOLN: INTRAVENOUS | Status: DC | PRN

## 2023-02-25 MED ORDER — FUROSEMIDE 40 MG PO TABS
40.0000 mg | ORAL_TABLET | Freq: Every day | ORAL | Status: DC
  Administered 2023-02-26: 40 mg via ORAL
  Filled 2023-02-25 (×2): qty 1

## 2023-02-25 MED ORDER — SODIUM CHLORIDE 0.9 % IV SOLN: 500.0000 mL | Freq: Once | INTRAVENOUS | Status: DC | PRN

## 2023-02-25 MED ORDER — METOPROLOL TARTRATE 5 MG/5ML IV SOLN: 2.0000 mg | INTRAVENOUS | Status: DC | PRN

## 2023-02-25 MED ORDER — FINASTERIDE 5 MG PO TABS
5.0000 mg | ORAL_TABLET | Freq: Every day | ORAL | Status: DC
  Administered 2023-02-25: 5 mg via ORAL
  Filled 2023-02-25: qty 1

## 2023-02-25 MED ORDER — CEFAZOLIN SODIUM-DEXTROSE 2-4 GM/100ML-% IV SOLN
2.0000 g | Freq: Three times a day (TID) | INTRAVENOUS | Status: AC
  Administered 2023-02-25 – 2023-02-26 (×2): 2 g via INTRAVENOUS
  Filled 2023-02-25 (×2): qty 100

## 2023-02-25 MED ORDER — LIDOCAINE 2% (20 MG/ML) 5 ML SYRINGE
INTRAMUSCULAR | Status: DC | PRN
  Administered 2023-02-25: 100 mg via INTRAVENOUS

## 2023-02-25 MED ORDER — PROTAMINE SULFATE 10 MG/ML IV SOLN
INTRAVENOUS | Status: DC | PRN
  Administered 2023-02-25: 50 mg via INTRAVENOUS

## 2023-02-25 MED ORDER — DOCUSATE SODIUM 100 MG PO CAPS
100.0000 mg | ORAL_CAPSULE | Freq: Every day | ORAL | Status: DC
  Administered 2023-02-26: 100 mg via ORAL
  Filled 2023-02-25: qty 1

## 2023-02-25 MED ORDER — LABETALOL HCL 5 MG/ML IV SOLN: 10.0000 mg | INTRAVENOUS | Status: DC | PRN

## 2023-02-25 MED ORDER — ATORVASTATIN CALCIUM 40 MG PO TABS
40.0000 mg | ORAL_TABLET | Freq: Every day | ORAL | Status: DC
  Administered 2023-02-26: 40 mg via ORAL
  Filled 2023-02-25 (×2): qty 1

## 2023-02-25 MED ORDER — AMISULPRIDE (ANTIEMETIC) 5 MG/2ML IV SOLN: 10.0000 mg | Freq: Once | INTRAVENOUS | Status: DC | PRN

## 2023-02-25 MED ORDER — PROPOFOL 10 MG/ML IV BOLUS
INTRAVENOUS | Status: DC | PRN
  Administered 2023-02-25: 150 mg via INTRAVENOUS

## 2023-02-25 MED ORDER — ORAL CARE MOUTH RINSE: 15.0000 mL | Freq: Once | OROMUCOSAL | Status: AC

## 2023-02-25 MED ORDER — SENNOSIDES-DOCUSATE SODIUM 8.6-50 MG PO TABS: 1.0000 | ORAL_TABLET | Freq: Every evening | ORAL | Status: DC | PRN

## 2023-02-25 MED ORDER — PROPOFOL 10 MG/ML IV BOLUS
INTRAVENOUS | Status: AC
  Filled 2023-02-25: qty 20

## 2023-02-25 MED ORDER — ONDANSETRON HCL 4 MG/2ML IJ SOLN
INTRAMUSCULAR | Status: AC
  Filled 2023-02-25: qty 2

## 2023-02-25 MED ORDER — HEPARIN 6000 UNIT IRRIGATION SOLUTION
Status: DC | PRN
  Administered 2023-02-25: 1

## 2023-02-25 MED ORDER — NITROGLYCERIN 0.4 MG SL SUBL: 0.4000 mg | SUBLINGUAL_TABLET | SUBLINGUAL | Status: DC | PRN

## 2023-02-25 MED ORDER — EPHEDRINE 5 MG/ML INJ
INTRAVENOUS | Status: AC
  Filled 2023-02-25: qty 5

## 2023-02-25 MED ORDER — SODIUM CHLORIDE 0.9 % IV SOLN: INTRAVENOUS | Status: DC

## 2023-02-25 MED ORDER — PHENYLEPHRINE 80 MCG/ML (10ML) SYRINGE FOR IV PUSH (FOR BLOOD PRESSURE SUPPORT)
PREFILLED_SYRINGE | INTRAVENOUS | Status: AC
  Filled 2023-02-25: qty 10

## 2023-02-25 MED ORDER — ACETAMINOPHEN 500 MG PO TABS
1000.0000 mg | ORAL_TABLET | Freq: Once | ORAL | Status: AC
  Administered 2023-02-25: 1000 mg via ORAL
  Filled 2023-02-25: qty 2

## 2023-02-25 MED ORDER — FENTANYL CITRATE (PF) 250 MCG/5ML IJ SOLN
INTRAMUSCULAR | Status: AC
  Filled 2023-02-25: qty 5

## 2023-02-25 MED ORDER — GUAIFENESIN-DM 100-10 MG/5ML PO SYRP: 15.0000 mL | ORAL_SOLUTION | ORAL | Status: DC | PRN

## 2023-02-25 MED ORDER — AMLODIPINE BESYLATE 5 MG PO TABS
5.0000 mg | ORAL_TABLET | Freq: Every day | ORAL | Status: DC
  Administered 2023-02-26: 5 mg via ORAL
  Filled 2023-02-25: qty 1

## 2023-02-25 MED ORDER — SPIRONOLACTONE 25 MG PO TABS
25.0000 mg | ORAL_TABLET | Freq: Every day | ORAL | Status: DC
  Administered 2023-02-26: 25 mg via ORAL
  Filled 2023-02-25 (×2): qty 1

## 2023-02-25 MED ORDER — POTASSIUM CHLORIDE CRYS ER 20 MEQ PO TBCR: 20.0000 meq | EXTENDED_RELEASE_TABLET | Freq: Every day | ORAL | Status: DC | PRN

## 2023-02-25 MED ORDER — ROCURONIUM BROMIDE 10 MG/ML (PF) SYRINGE
PREFILLED_SYRINGE | INTRAVENOUS | Status: DC | PRN
  Administered 2023-02-25: 80 mg via INTRAVENOUS

## 2023-02-25 MED ORDER — LEVOTHYROXINE SODIUM 75 MCG PO TABS
75.0000 ug | ORAL_TABLET | Freq: Every day | ORAL | Status: DC
  Administered 2023-02-26: 75 ug via ORAL
  Filled 2023-02-25: qty 1

## 2023-02-25 MED ORDER — PHENYLEPHRINE 80 MCG/ML (10ML) SYRINGE FOR IV PUSH (FOR BLOOD PRESSURE SUPPORT)
PREFILLED_SYRINGE | INTRAVENOUS | Status: DC | PRN
  Administered 2023-02-25 (×3): 160 ug via INTRAVENOUS

## 2023-02-25 MED ORDER — CHLORHEXIDINE GLUCONATE CLOTH 2 % EX PADS: 6.0000 | MEDICATED_PAD | Freq: Once | CUTANEOUS | Status: DC

## 2023-02-25 MED ORDER — LOSARTAN POTASSIUM 50 MG PO TABS
100.0000 mg | ORAL_TABLET | Freq: Every day | ORAL | Status: DC
  Administered 2023-02-26: 100 mg via ORAL
  Filled 2023-02-25 (×2): qty 2

## 2023-02-25 MED ORDER — HEPARIN 6000 UNIT IRRIGATION SOLUTION
Status: AC
  Filled 2023-02-25: qty 500

## 2023-02-25 MED ORDER — OXYCODONE-ACETAMINOPHEN 5-325 MG PO TABS
1.0000 | ORAL_TABLET | ORAL | Status: DC | PRN
  Administered 2023-02-25: 2 via ORAL
  Filled 2023-02-25: qty 2

## 2023-02-25 MED ORDER — ONDANSETRON HCL 4 MG/2ML IJ SOLN: 4.0000 mg | Freq: Four times a day (QID) | INTRAMUSCULAR | Status: DC | PRN

## 2023-02-25 MED ORDER — TAMSULOSIN HCL 0.4 MG PO CAPS
0.4000 mg | ORAL_CAPSULE | Freq: Every day | ORAL | Status: DC
  Administered 2023-02-25: 0.4 mg via ORAL
  Filled 2023-02-25: qty 1

## 2023-02-25 MED ORDER — ACETAMINOPHEN 325 MG PO TABS: 325.0000 mg | ORAL_TABLET | ORAL | Status: DC | PRN

## 2023-02-25 MED ORDER — MAGNESIUM SULFATE 2 GM/50ML IV SOLN: 2.0000 g | Freq: Every day | INTRAVENOUS | Status: DC | PRN

## 2023-02-25 MED ORDER — LIDOCAINE 2% (20 MG/ML) 5 ML SYRINGE
INTRAMUSCULAR | Status: AC
  Filled 2023-02-25: qty 5

## 2023-02-25 MED ORDER — FENTANYL CITRATE (PF) 100 MCG/2ML IJ SOLN: 25.0000 ug | INTRAMUSCULAR | Status: DC | PRN

## 2023-02-25 MED ORDER — FENTANYL CITRATE (PF) 250 MCG/5ML IJ SOLN
INTRAMUSCULAR | Status: DC | PRN
  Administered 2023-02-25: 100 ug via INTRAVENOUS

## 2023-02-25 MED ORDER — PROMETHAZINE HCL 25 MG/ML IJ SOLN: 6.2500 mg | INTRAMUSCULAR | Status: DC | PRN

## 2023-02-25 MED ORDER — HEPARIN SODIUM (PORCINE) 1000 UNIT/ML IJ SOLN
INTRAMUSCULAR | Status: AC
  Filled 2023-02-25: qty 10

## 2023-02-25 SURGICAL SUPPLY — 58 items
ADH SKN CLS APL DERMABOND .7 (GAUZE/BANDAGES/DRESSINGS) ×4
BAG COUNTER SPONGE SURGICOUNT (BAG) ×3 IMPLANT
BAG SPNG CNTER NS LX DISP (BAG) ×2
BLADE CLIPPER SURG (BLADE) ×3 IMPLANT
CANISTER SUCT 3000ML PPV (MISCELLANEOUS) ×3 IMPLANT
CATH BEACON 5.038 65CM KMP-01 (CATHETERS) ×3 IMPLANT
CATH OMNI FLUSH .035X70CM (CATHETERS) ×3 IMPLANT
CATH VANSCH 5FR 6CM (CATHETERS) IMPLANT
DERMABOND ADVANCED .7 DNX12 (GAUZE/BANDAGES/DRESSINGS) ×3 IMPLANT
DEVICE CLOSURE PERCLS PRGLD 6F (VASCULAR PRODUCTS) ×12 IMPLANT
DEVICE TORQUE H2O (MISCELLANEOUS) IMPLANT
DEVICE TORQUE KENDALL .025-038 (MISCELLANEOUS) IMPLANT
DRSG TEGADERM 2-3/8X2-3/4 SM (GAUZE/BANDAGES/DRESSINGS) ×6 IMPLANT
ELECT CAUTERY BLADE 6.4 (BLADE) ×3 IMPLANT
ELECT REM PT RETURN 9FT ADLT (ELECTROSURGICAL) ×4
ELECTRODE REM PT RTRN 9FT ADLT (ELECTROSURGICAL) ×6 IMPLANT
EXCLDR TRNK ENDO 26X14.5X12 16 (Endovascular Graft) ×2 IMPLANT
EXCLUDER TNK END 26X14.5X12 16 (Endovascular Graft) IMPLANT
GAUZE SPONGE 2X2 8PLY STRL LF (GAUZE/BANDAGES/DRESSINGS) ×6 IMPLANT
GAUZE SPONGE 2X2 STRL 8-PLY (GAUZE/BANDAGES/DRESSINGS) IMPLANT
GAUZE SPONGE 4X4 12PLY STRL (GAUZE/BANDAGES/DRESSINGS) IMPLANT
GLOVE SURG SS PI 7.5 STRL IVOR (GLOVE) ×9 IMPLANT
GOWN STRL REUS W/ TWL LRG LVL3 (GOWN DISPOSABLE) ×6 IMPLANT
GOWN STRL REUS W/ TWL XL LVL3 (GOWN DISPOSABLE) ×6 IMPLANT
GOWN STRL REUS W/TWL LRG LVL3 (GOWN DISPOSABLE) ×4
GOWN STRL REUS W/TWL XL LVL3 (GOWN DISPOSABLE) ×4
GRAFT BALLN CATH 65CM (BALLOONS) ×3 IMPLANT
GUIDEWIRE ANGLED .035X150CM (WIRE) IMPLANT
KIT BASIN OR (CUSTOM PROCEDURE TRAY) ×3 IMPLANT
KIT DRAIN CSF ACCUDRAIN (MISCELLANEOUS) IMPLANT
KIT TURNOVER KIT B (KITS) ×3 IMPLANT
LEG CONTRALATERAL 16X16X13.5 (Endovascular Graft) ×2 IMPLANT
LEG CONTRALATERAL 16X16X9.5 (Endovascular Graft) IMPLANT
NS IRRIG 1000ML POUR BTL (IV SOLUTION) ×3 IMPLANT
PACK ENDOVASCULAR (PACKS) ×3 IMPLANT
PAD ARMBOARD 7.5X6 YLW CONV (MISCELLANEOUS) ×6 IMPLANT
PENCIL BUTTON HOLSTER BLD 10FT (ELECTRODE) ×3 IMPLANT
PERCLOSE PROGLIDE 6F (VASCULAR PRODUCTS) ×10
SET MICROPUNCTURE 5F STIFF (MISCELLANEOUS) ×3 IMPLANT
SHEATH BRITE TIP 8FR 23CM (SHEATH) ×3 IMPLANT
SHEATH DRYSEAL FLEX 12FR 33CM (SHEATH) IMPLANT
SHEATH DRYSEAL FLEX 16FR 33CM (SHEATH) IMPLANT
SHEATH PINNACLE 8F 10CM (SHEATH) ×3 IMPLANT
STENT GRAFT CONTRALAT 16X13.5 (Endovascular Graft) IMPLANT
STOPCOCK MORSE 400PSI 3WAY (MISCELLANEOUS) ×3 IMPLANT
SUT PROLENE 5 0 C 1 24 (SUTURE) IMPLANT
SUT VIC AB 2-0 CT1 27 (SUTURE)
SUT VIC AB 2-0 CT1 TAPERPNT 27 (SUTURE) IMPLANT
SUT VIC AB 3-0 SH 27 (SUTURE)
SUT VIC AB 3-0 SH 27X BRD (SUTURE) IMPLANT
SUT VICRYL 4-0 PS2 18IN ABS (SUTURE) IMPLANT
SYR 20ML LL LF (SYRINGE) ×3 IMPLANT
TOWEL GREEN STERILE (TOWEL DISPOSABLE) ×3 IMPLANT
TRAY FOLEY MTR SLVR 16FR STAT (SET/KITS/TRAYS/PACK) ×3 IMPLANT
TUBING HIGH PRESSURE 120CM (CONNECTOR) ×3 IMPLANT
WIRE AMPLATZ SS-J .035X180CM (WIRE) ×6 IMPLANT
WIRE BENTSON .035X145CM (WIRE) ×6 IMPLANT
WIRE STARTER BENTSON 035X150 (WIRE) IMPLANT

## 2023-02-25 NOTE — Op Note (Signed)
Patient name: Bryan Carter MRN: [id number] DOB: 14-Nov-1940 Sex: male  02/25/2023 Pre-operative Diagnosis: Abdominal aortic aneurysm Post-operative diagnosis:  Same Surgeon:  [name] Assistants:  Clinton Gallant, PA Procedure:   #1: Endovascular repair of abdominal aortic aneurysm ([id number]   #2: Placement of extension, endovascular repair [phone])   #3: Ultrasound-guided bilateral common femoral artery access [phone]   #4: Radiology S and I codes Anesthesia:  General Blood Loss:  minimal Specimens:  none  Findings: Complete exclusion Devices used: Main body was primary right Gore 26 x 14 x 12.  Ipsilateral right extension was a Gore 16 x 9.5.  Contralateral left was a Gore 16 x 13.5  Indications: This is a 82 year old gentleman with a known abdominal aortic aneurysm that has progressively increased in size, now measuring 5.6 cm.  He comes in today for endovascular repair.  Procedure:  The patient was identified in the holding area and taken to [facility] OR ROOM 16  The patient was then placed supine on the table. general anesthesia was administered.  The patient was prepped and draped in the usual sterile fashion.  A time out was called and antibiotics were administered.  A PA was necessary to expect procedure and assist with technical details.  She help with wire exchanges, device deployment, and sheath removal  Ultrasound was used to evaluate bilateral common femoral arteries which had mild to moderate calcification.  #11 blade was used to make a skin nick bilaterally.  Bilateral common femoral arteries were cannulated under ultrasound guidance with a micropuncture needle.  A [place] wire was then inserted followed by placement of micropuncture sheath.  Bentson wires were then placed bilaterally.  The subcutaneous tract was dilated with an 8 [place] dilator.  Pro-glide devices were deployed at the 11:00 and 1 o'clock position for free closure and 8 French sheaths were placed.  The patient was fully  heparinized.  Over Amplatz Super Stiff wires, a 16 French sheath was advanced up the right side and a 12 French sheath on the left.  The main body was prepared on the back table and inserted up the right.  This was a Gore 26 x 14 x 12 device.  An Omni Flush catheter was advanced at the left side and positioned at L1.  An abdominal aortogram was performed locating the renal arteries.  The device was then deployed at this level.  Next, the contralateral gate was cannulated.  I had difficulty cannulated initially and so I had to reconstrain the main body and rotate the gate.  After doing this was able to cannulate it with a Berenstein catheter and a Glidewire.  I then exchanged out for a Omni Flush catheter which is able to be freely rotated within the main body, confirming successful cannulation.  A Amplatz wire was then inserted.  I then performed additional imaging to locate the renal arteries.  I then reconstrain the device and advanced it cephalad and then redeployed it at the level of the renal arteries.  The constraining ties on the proximal graft were then released.  Next, the image detector was rotated to a right anterior oblique position and a retrograde injection was performed through the sheath in the left groin.  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  I then extended the left side with a 16 x 13.5 device.  I then deployed the remaining portion of the ipsilateral limb.  Contrast injections were performed to locate the right hypogastric artery which was occluded.  I then extended the right side with a 16 x 9.5 device.  I then used a MOB balloon to mold the proximal and distal attachment sites as well as device overlap.  A completion arteriogram was then performed which showed complete exclusion of the aneurysm.  There is no obvious endoleak.  I then inserted Bentson wires bilaterally.  The sheaths were then removed from each groin and the arteriotomy sites closed by securing the  previously placed ProGlide devices.  We then checked to make sure that he had Doppler signals in both feet which were brisk.  Cautery was used on the subcutaneous tissue.  The heparin was reversed with protamine.  Dermabond was placed on the wound.  He was successfully explained taken recovery in stable condition.  There were no immediate complications.     Disposition:  To PACU stable.   Juleen China, M.D., [facility] [place] Office: [phone] Pager:  [phone]

## 2023-02-25 NOTE — Anesthesia Procedure Notes (Signed)
Procedure Name: Intubation Date/Time: 02/25/2023 7:45 AM  Performed by: Gus Puma, CRNAPre-anesthesia Checklist: Patient identified, Emergency Drugs available, Suction available and Patient being monitored Patient Re-evaluated:Patient Re-evaluated prior to induction Oxygen Delivery Method: Circle System Utilized Preoxygenation: Pre-oxygenation with 100% oxygen Induction Type: IV induction Ventilation: Mask ventilation without difficulty Laryngoscope Size: Mac and 3 Grade View: Grade I Tube type: Oral Number of attempts: 1 Airway Equipment and Method: Stylet and Oral airway Placement Confirmation: ETT inserted through vocal cords under direct vision, positive ETCO2 and breath sounds checked- equal and bilateral Secured at: 22 cm Tube secured with: Tape Dental Injury: Teeth and Oropharynx as per pre-operative assessment  Comments: Placed by Terri Piedra

## 2023-02-25 NOTE — Progress Notes (Signed)
Pt set up on CPAP.  Tolerating well at this time.

## 2023-02-25 NOTE — Interval H&P Note (Signed)
History and Physical Interval Note:  02/25/2023 7:29 AM  Bryan Carter  has presented today for surgery, with the diagnosis of Infrarenal abdominal aortic aneurysm without rupture.  The various methods of treatment have been discussed with the patient and family. After consideration of risks, benefits and other options for treatment, the patient has consented to  Procedure(s): ABDOMINAL AORTIC ENDOVASCULAR STENT GRAFT (N/A) as a surgical intervention.  The patient's history has been reviewed, patient examined, no change in status, stable for surgery.  I have reviewed the patient's chart and labs.  Questions were answered to the patient's satisfaction.     [name]

## 2023-02-25 NOTE — Anesthesia Procedure Notes (Signed)
Arterial Line Insertion Start/End5/22/2024 6:55 AM, 02/25/2023 7:05 AM Performed by: Heather Roberts, MD, Nathalya Wolanski, Canary Brim, CRNA, CRNA  Patient location: Pre-op. Preanesthetic checklist: patient identified, IV checked, site marked, risks and benefits discussed, surgical consent, monitors and equipment checked, pre-op evaluation, timeout performed and anesthesia consent Lidocaine 1% used for infiltration Right, radial was placed Catheter size: 20 G Hand hygiene performed  and Seldinger technique used Allen's test indicative of satisfactory collateral circulation Attempts: 1 Procedure performed without using ultrasound guided technique. Following insertion, dressing applied and Biopatch. Post procedure assessment: normal  Patient tolerated the procedure well with no immediate complications.

## 2023-02-25 NOTE — Progress Notes (Signed)
  Day of Surgery Note    Subjective:  no complaints; says he has a lot of gas   Vitals:   02/25/23 1443 02/25/23 1538  BP: (!) 146/64 (!) 141/72  Pulse: 69   Resp: (!) 23 18  Temp: (!) 97.5 F (36.4 C)   SpO2: 99% 100%    Incisions:   bilateral groins are soft Extremities:  monophasic bilateral PT doppler signals and left monophasic peroneal doppler signal Cardiac:  regular Lungs:  non labored Abdomen:  soft   Assessment/Plan:  This is a 82 y.o. male who is s/p  EVAR  -pt doing well on 4 east.  Bilateral groins are soft without hematoma.  + monophasic doppler signals bilateral feet -anticipate discharge tomorrow if evening uneventful.  -pt on statin/Brilinta.  Will d/w Dr. Myra Gianotti tomorrow about when to start Brilinta back.   Doreatha Massed, PA-C 02/25/2023 3:43 PM [phone]

## 2023-02-25 NOTE — Anesthesia Postprocedure Evaluation (Signed)
Anesthesia Post Note  Patient: DENZALE MOYA  Procedure(s) Performed: ABDOMINAL AORTIC ENDOVASCULAR STENT GRAFT (Groin) ULTRASOUND GUIDANCE FOR VASCULAR ACCESS (Bilateral: Groin)     Patient location during evaluation: PACU Anesthesia Type: General Level of consciousness: sedated Pain management: pain level controlled Vital Signs Assessment: post-procedure vital signs reviewed and stable Respiratory status: spontaneous breathing and respiratory function stable Cardiovascular status: stable Postop Assessment: no apparent nausea or vomiting Anesthetic complications: no   No notable events documented.  Last Vitals:  Vitals:   02/25/23 1145 02/25/23 1200  BP: (!) 144/71 (!) 144/68  Pulse: 60 60  Resp: 20 19  Temp:    SpO2: 97% 96%    Last Pain:  Vitals:   02/25/23 1145  TempSrc:   PainSc: 0-No pain                 Razi Hickle DANIEL

## 2023-02-25 NOTE — Transfer of Care (Signed)
Immediate Anesthesia Transfer of Care Note  Patient: Bryan Carter  Procedure(s) Performed: ABDOMINAL AORTIC ENDOVASCULAR STENT GRAFT (Groin) ULTRASOUND GUIDANCE FOR VASCULAR ACCESS (Bilateral: Groin)  Patient Location: PACU  Anesthesia Type:General  Level of Consciousness: drowsy and patient cooperative  Airway & Oxygen Therapy: Patient Spontanous Breathing and Patient connected to face mask oxygen  Post-op Assessment: Report given to RN, Post -op Vital signs reviewed and stable, and Patient moving all extremities X 4  Post vital signs: Reviewed and stable  Last Vitals:  Vitals Value Taken Time  BP    Temp    Pulse    Resp    SpO2      Last Pain:  Vitals:   02/25/23 0608  TempSrc:   PainSc: 0-No pain         Complications: No notable events documented.

## 2023-02-25 NOTE — Discharge Instructions (Signed)
[facility] [place]   Discharge Instructions  Endovascular Aortic Aneurysm Repair  Please refer to the following instructions for your post-procedure care. Your surgeon or Physician Assistant will discuss any changes with you.  Activity  You are encouraged to walk as much as you can. You can slowly return to normal activities but must avoid strenuous activity and heavy lifting until your doctor tells you it's OK. Avoid activities such as vacuuming or swinging a gold club. It is normal to feel tired for several weeks after your surgery. Do not drive until your doctor gives the OK and you are no longer taking prescription pain medications. It is also normal to have difficulty with sleep habits, eating, and bowel movements after surgery. These will go away with time.  Bathing/Showering  Shower daily after you go home.  Do not soak in a bathtub, hot tub, or swim until the incision heals completely.  If you have incisions in your groin, wash the groin wounds with soap and water daily and pat dry. (No tub bath-only shower)  Then put a dry gauze or washcloth there to keep this area dry to help prevent wound infection daily and as needed.  Do not use Vaseline or neosporin on your incisions.  Only use soap and water on your incisions and then protect and keep dry.  Incision Care  Shower every day. Clean your incision with mild soap and water. Pat the area dry with a clean towel. You do not need a bandage unless otherwise instructed. Do not apply any ointments or creams to your incision. If you clothing is irritating, you may cover your incision with a dry gauze pad.  Diet  Resume your normal diet. There are no special food restrictions following this procedure. A low fat/low cholesterol diet is recommended for all patients with vascular disease. In order to heal from your surgery, it is CRITICAL to get adequate nutrition. Your body requires vitamins, minerals, and protein.  Vegetables are the best source of vitamins and minerals. Vegetables also provide the perfect balance of protein. Processed food has little nutritional value, so try to avoid this.  Medications  Resume taking all of your medications unless your doctor or nurse practitioner tells you not to. If your incision is causing pain, you may take over-the-counter pain relievers such as acetaminophen (Tylenol). If you were prescribed a stronger pain medication, please be aware these medications can cause nausea and constipation. Prevent nausea by taking the medication with a snack or meal. Avoid constipation by drinking plenty of fluids and eating foods with a high amount of fiber, such as fruits, vegetables, and grains.  Do not take Tylenol if you are taking prescription pain medications.   Follow up  Our office will schedule a follow-up appointment with a CT scan 3-4 weeks after your surgery.  Please call us immediately for any of the following conditions  Severe or worsening pain in your legs or feet or in your abdomen back or chest. Increased pain, redness, drainage (pus) from your incision site. Increased abdominal pain, bloating, nausea, vomiting or persistent diarrhea. Fever of 101 degrees or higher. Swelling in your leg (s),  Reduce your risk of vascular disease  Stop smoking. If you would like help call [facility] at 1-800-QUIT-NOW (1-800-784-8669) or [place] at 336-586-4000. Manage your cholesterol Maintain a desired weight Control your diabetes Keep your blood pressure down  If you have questions, please call the office at [phone].  [facility]   [place]   Discharge Instructions  Endovascular Aortic Aneurysm Repair  Please refer to the following instructions for your post-procedure care. Your surgeon or Physician Assistant will discuss any changes with you.  Activity  You are encouraged to walk as much as you can. You can slowly return to normal  activities but must avoid strenuous activity and heavy lifting until your doctor tells you it's OK. Avoid activities such as vacuuming or swinging a gold club. It is normal to feel tired for several weeks after your surgery. Do not drive until your doctor gives the OK and you are no longer taking prescription pain medications. It is also normal to have difficulty with sleep habits, eating, and bowel movements after surgery. These will go away with time.  Bathing/Showering  You may shower after you go home. If you have an incision, do not soak in a bathtub, hot tub, or swim until the incision heals completely.  Incision Care  Shower every day. Clean your incision with mild soap and water. Pat the area dry with a clean towel. You do not need a bandage unless otherwise instructed. Do not apply any ointments or creams to your incision. If you clothing is irritating, you may cover your incision with a dry gauze pad.  Diet  Resume your normal diet. There are no special food restrictions following this procedure. A low fat/low cholesterol diet is recommended for all patients with vascular disease. In order to heal from your surgery, it is CRITICAL to get adequate nutrition. Your body requires vitamins, minerals, and protein. Vegetables are the best source of vitamins and minerals. Vegetables also provide the perfect balance of protein. Processed food has little nutritional value, so try to avoid this.  Medications  Resume taking all of your medications unless your doctor or nurse practitioner tells you not to. If your incision is causing pain, you may take over-the-counter pain relievers such as acetaminophen (Tylenol). If you were prescribed a stronger pain medication, please be aware these medications can cause nausea and constipation. Prevent nausea by taking the medication with a snack or meal. Avoid constipation by drinking plenty of fluids and eating foods with a high amount of fiber, such as fruits,  vegetables, and grains. Do not take Tylenol if you are taking prescription pain medications.   Follow up  Our office will schedule a follow-up appointment with a C.T. scan 3-4 weeks after your surgery.  Please call us immediately for any of the following conditions  Severe or worsening pain in your legs or feet or in your abdomen back or chest. Increased pain, redness, drainage (pus) from your incision sit. Increased abdominal pain, bloating, nausea, vomiting or persistent diarrhea. Fever of 101 degrees or higher. Swelling in your leg (s),  Reduce your risk of vascular disease  Stop smoking. If you would like help call [facility] at 1-800-QUIT-NOW (1-800-784-8669) or [place] at 336-586-4000. Manage your cholesterol Maintain a desired weight Control your diabetes Keep your blood pressure down  If you have questions, please call the office at [phone].   

## 2023-02-26 ENCOUNTER — Encounter (HOSPITAL_COMMUNITY): Payer: Self-pay | Admitting: Surgery

## 2023-02-26 LAB — CBC
HCT: 29.3 % — ABNORMAL LOW (ref 39.0–52.0)
Hemoglobin: 9.8 g/dL — ABNORMAL LOW (ref 13.0–17.0)
MCH: 29.8 pg (ref 26.0–34.0)
MCHC: 33.4 g/dL (ref 30.0–36.0)
MCV: 89.1 fL (ref 80.0–100.0)
Platelets: 187 10*3/uL (ref 150–400)
RBC: 3.29 MIL/uL — ABNORMAL LOW (ref 4.22–5.81)
RDW: 14.9 % (ref 11.5–15.5)
WBC: 9.7 10*3/uL (ref 4.0–10.5)
nRBC: 0 % (ref 0.0–0.2)

## 2023-02-26 LAB — MAGNESIUM: Magnesium: 1.9 mg/dL (ref 1.7–2.4)

## 2023-02-26 LAB — PROTIME-INR
INR: 1.2 (ref 0.8–1.2)
Prothrombin Time: 15.2 seconds (ref 11.4–15.2)

## 2023-02-26 LAB — BASIC METABOLIC PANEL
Anion gap: 7 (ref 5–15)
BUN: 28 mg/dL — ABNORMAL HIGH (ref 8–23)
CO2: 19 mmol/L — ABNORMAL LOW (ref 22–32)
Calcium: 8.9 mg/dL (ref 8.9–10.3)
Chloride: 111 mmol/L (ref 98–111)
Creatinine, Ser: 1.55 mg/dL — ABNORMAL HIGH (ref 0.61–1.24)
GFR, Estimated: 44 mL/min — ABNORMAL LOW (ref >60)
Glucose, Bld: 126 mg/dL — ABNORMAL HIGH (ref 70–99)
Potassium: 4.2 mmol/L (ref 3.5–5.1)
Sodium: 137 mmol/L (ref 135–145)

## 2023-02-26 LAB — APTT: aPTT: 29 seconds (ref 24–36)

## 2023-02-26 MED ORDER — OXYCODONE-ACETAMINOPHEN 5-325 MG PO TABS: 1.0000 | ORAL_TABLET | Freq: Four times a day (QID) | ORAL | 0 refills | Status: AC | PRN

## 2023-02-26 MED ORDER — TICAGRELOR 90 MG PO TABS
90.0000 mg | ORAL_TABLET | Freq: Two times a day (BID) | ORAL | Status: DC
  Administered 2023-02-26: 90 mg via ORAL
  Filled 2023-02-26: qty 1

## 2023-02-26 NOTE — TOC Transition Note (Signed)
Transition of Care (TOC) - CM/SW Discharge Note Donn Pierini RN, BSN Transitions of Care Unit 4E- RN Case Manager See Treatment Team for direct phone #   Patient Details  Name: Bryan Carter MRN: [id number] Date of Birth: [birth year]  Transition of Care [facility] CM/SW Contact:  Darrold Span, RN Phone Number: 02/26/2023, 11:51 AM   Clinical Narrative:    Pt stable for transition home today, CM Notified by [facility] liaison -following patient with MD office protocol referral prearranged for [facility] needs- CM has notified [facility] liaison of discharge today for start of care.   Family to transport home, No further TOC needs noted,    Final next level of care: Home w Home Health Services Barriers to Discharge: No Barriers Identified   Patient Goals and CMS Choice   Choice offered to / list presented to : NA  Discharge Placement                 Home w/ [facility]        Discharge Plan and Services Additional resources added to the After Visit Summary for                  DME Arranged: N/A DME Agency: NA         HH Agency: [facility] Date [facility] Agency Contacted: 02/26/23   Representative spoke with at [facility] Agency: Bjorn Loser  Social Determinants of Health (SDOH) Interventions SDOH Screenings   Tobacco Use: Medium Risk (02/25/2023)     Readmission Risk Interventions    02/26/2023   11:51 AM  Readmission Risk Prevention Plan  Medication Screening Complete  Transportation Screening Complete

## 2023-02-26 NOTE — Progress Notes (Signed)
Carlynn Spry to be D/C'd home per MD order. Discussed with the patient and daughter and all questions fully answered.  Skin clean, dry and intact without evidence of skin break down, no evidence of skin tears noted. Bilateral groin sites intact. Patient successfully voided post foley removal, tolerated breakfast well, and ambulated around unit without issues. IV catheters discontinued intact. Site without signs and symptoms of complications. Dressing and pressure applied.  An After Visit Summary was printed and given to the patient.  Patient escorted via WC, and D/C home via private auto.  Jon Gills  02/26/2023 11:45 AM

## 2023-02-26 NOTE — Progress Notes (Addendum)
[facility] [place]  Subjective  - No complaints   Objective (!) 145/66 73 98.5 F (36.9 C) (Oral) 20 100%  Intake/Output Summary (Last 24 hours) at 02/26/2023 0747 Last data filed at 02/26/2023 0300 Gross per 24 hour  Intake 2702.5 ml  Output 630 ml  Net 2072.5 ml    Moving B LE warm to touch  Groins soft without hematoma Lungs non labored breathing Abdomin soft NTT palpation  Assessment/Planning: POD # 1 EVAR  He needs to ambulate, void and tolerate breakfast. Plan will be for discharge today in stable condition IV fluids stopped this am.  Cr 1.55 decreased from 1.7 He will continue Lipitor and Brilinta daily  Plan for f/u CTA ABD/Pelvis in 4 weeks and f/u with DR. Waneta Martins 02/26/2023 7:47 AM --  Laboratory Lab Results: Recent Labs    02/26/23 0510  WBC 9.7  HGB 9.8*  HCT 29.3*  PLT 187   BMET Recent Labs    02/26/23 0510  NA 137  K 4.2  CL 111  CO2 19*  GLUCOSE 126*  BUN 28*  CREATININE 1.55*  CALCIUM 8.9    COAG Lab Results  Component Value Date   INR 1.2 02/26/2023   INR 1.2 02/20/2023   INR 1.0 11/05/2007   No results found for: "PTT"   I agree with the above.  Have seen and evaluated patient.  He has no abdominal pain.  His groin sites are soft.  He has yet to ambulate or void.  If he is able to do these, he can be discharged with follow-up in 1 month with CT scan.  [name]

## 2023-02-26 NOTE — Plan of Care (Signed)
  Problem: Education: Goal: Knowledge of discharge needs will improve Outcome: Progressing   Problem: Clinical Measurements: Goal: Postoperative complications will be avoided or minimized Outcome: Progressing   Problem: Respiratory: Goal: Ability to achieve and maintain a regular respiratory rate will improve Outcome: Progressing   Problem: Skin Integrity: Goal: Demonstration of wound healing without infection will improve Outcome: Progressing   

## 2023-03-03 NOTE — Discharge Summary (Signed)
[facility] Discharge Summary   Patient ID:  Bryan Carter MRN: [id number] DOB/AGE: Nov 20, 1940 82 y.o.  Admit date: 02/25/2023 Discharge date: 02/26/23 Date of Surgery: 02/25/2023 Surgeon: Surgeon(s): Nada Libman, MD  Admission Diagnosis: AAA (abdominal aortic aneurysm) [facility]) [I71.40]  Discharge Diagnoses:  AAA (abdominal aortic aneurysm) (HCC) [I71.40]  Secondary Diagnoses: Past Medical History:  Diagnosis Date   AAA (abdominal aortic aneurysm) (HCC)    Arthritis    "joints, back" (11/12/2017)   Coronary artery disease    Hyperlipidemia    Hypertension    Hypothyroidism    Myocardial infarction (HCC) 2008   Sleep apnea 2019    Procedure(s): ABDOMINAL AORTIC ENDOVASCULAR STENT GRAFT ULTRASOUND GUIDANCE FOR VASCULAR ACCESS  Discharged Condition: stable     Hospital Course:  Bryan Carter is a 82 y.o. male is S/P  Procedure(s): ABDOMINAL AORTIC ENDOVASCULAR STENT GRAFT ULTRASOUND GUIDANCE FOR VASCULAR ACCESS This was done for 5.6 cm AAA Post op he had an uneventful stay over night.   LE warm and well well perfused.  Abdomin soft without TTP.   He will continue Lipitor and Brilinta daily  Plan for f/u CTA ABD/Pelvis in 4 weeks and f/u with DR. Brabham  Significant Diagnostic Studies: CBC Lab Results  Component Value Date   WBC 9.7 02/26/2023   HGB 9.8 (L) 02/26/2023   HCT 29.3 (L) 02/26/2023   MCV 89.1 02/26/2023   PLT 187 02/26/2023    BMET    Component Value Date/Time   NA 137 02/26/2023 0510   K 4.2 02/26/2023 0510   CL 111 02/26/2023 0510   CO2 19 (L) 02/26/2023 0510   GLUCOSE 126 (H) 02/26/2023 0510   BUN 28 (H) 02/26/2023 0510   CREATININE 1.55 (H) 02/26/2023 0510   CREATININE 1.68 (H) 11/20/2020 1004   CALCIUM 8.9 02/26/2023 0510   GFRNONAA 44 (L) 02/26/2023 0510   GFRNONAA 38 (L) 11/20/2020 1004   GFRAA 44 (L) 11/20/2020 1004   COAG Lab Results  Component Value Date   INR 1.2 02/26/2023   INR 1.2 02/20/2023    INR 1.0 11/05/2007     Disposition:  Discharge to :Home Discharge Instructions     Call MD for:  redness, tenderness, or signs of infection (pain, swelling, bleeding, redness, odor or green/yellow discharge around incision site)   Complete by: As directed    Call MD for:  severe or increased pain, loss or decreased feeling  in affected limb(s)   Complete by: As directed    Call MD for:  temperature >100.5   Complete by: As directed    Resume previous diet   Complete by: As directed       Allergies as of 02/26/2023       Reactions   Quinapril Shortness Of Breath, Cough        Medication List     TAKE these medications    albuterol 108 (90 Base) MCG/ACT inhaler Commonly known as: VENTOLIN HFA Inhale 2 puffs into the lungs every 6 (six) hours as needed for wheezing or shortness of breath.   amLODipine 5 MG tablet Commonly known as: NORVASC Take 5 mg by mouth daily.   atorvastatin 40 MG tablet Commonly known as: LIPITOR Take 40 mg by mouth daily.   carvedilol 25 MG tablet Commonly known as: COREG Take 25 mg by mouth 2 (two) times daily.   Cyanocobalamin 5000 MCG Tbdp Take 5,000 mcg by mouth daily.   finasteride 5 MG tablet Commonly known  as: PROSCAR Take 5 mg by mouth at bedtime.   FISH OIL PO Take 1 capsule by mouth 2 (two) times daily.   furosemide 40 MG tablet Commonly known as: LASIX Take 40 mg by mouth daily.   levothyroxine 75 MCG tablet Commonly known as: SYNTHROID Take 75 mcg by mouth daily before breakfast.   losartan 100 MG tablet Commonly known as: COZAAR Take 100 mg by mouth daily.   multivitamin tablet Take 1 tablet daily by mouth.   nitroGLYCERIN 0.4 MG SL tablet Commonly known as: Nitrostat Place 1 tablet (0.4 mg total) under the tongue every 5 (five) minutes as needed for chest pain.   NON FORMULARY Pt uses a cpap nightly   oxyCODONE-acetaminophen 5-325 MG tablet Commonly known as: PERCOCET/ROXICET Take 1 tablet by mouth  every 6 (six) hours as needed for moderate pain.   spironolactone 25 MG tablet Commonly known as: ALDACTONE Take 1 tablet (25 mg total) by mouth daily.   tamsulosin 0.4 MG Caps capsule Commonly known as: FLOMAX Take 0.4 mg by mouth at bedtime.   ticagrelor 90 MG Tabs tablet Commonly known as: BRILINTA Take 1 tablet (90 mg total) by mouth 2 (two) times daily.   VITAMIN D3 PO Take 1 tablet by mouth daily.       Verbal and written Discharge instructions given to the patient. Wound care per Discharge AVS  Follow-up Information     Nada Libman, MD Follow up in 4 week(s).   Specialties: Vascular Surgery, Cardiology Why: Office will call you to arrange your appt (sent) Contact information: [address] [place] [address] [phone]         [facility] Follow up.   Why: Iantha Fallen) Vascular office referral for [facility] needs- they will contact you to schedule Contact information: [address] [place] [address] [phone]                 Signed: Mosetta Pigeon 03/03/2023, 8:31 AM  - For VQI Registry use --- Instructions: Press F2 to tab through selections.  Delete question if not applicable.   Post-op:  Time to Extubation: [ ]  In OR, [ ]  < 12 hrs, [ ]  12-24 hrs, [ ]  >=24 hrs Vasopressors Req. Post-op: No MI: [ ]  No, [ ]  Troponin only, [ ]  EKG or Clinical New Arrhythmia: No CHF: No ICU Stay: 0 days Transfusion: No  If yes, 0 units given  Complications: Resp failure: [x ] none, [ ]  Pneumonia, [ ]  Ventilator Chg in renal function: [x ] none, [ ]  Inc. Cr > 0.5, [ ]  Temp. Dialysis, [ ]  Permanent dialysis Leg ischemia: [ x] No, [ ]  Yes, no Surgery needed, [ ]  Yes, Surgery needed, [ ]  Amputation Bowel ischemia: [x ] No, [ ]  Medical Rx, [ ]  Surgical Rx Wound complication: [ ]  No, [ ]  Superficial separation/infection, [ ]  Return to OR Return to OR: No  Return to OR for bleeding: No Stroke: [ x] None, [ ]  Minor, [ ]   Major  Discharge medications: Statin use:  Yes ASA use:  No  for medical reason not indicated, Brilinta was given Plavix use:  No  for medical reason not indicated Beta blocker use:  Yes

## 2023-03-06 ENCOUNTER — Telehealth: Payer: Self-pay

## 2023-03-06 NOTE — Telephone Encounter (Signed)
Cindee Lame, PT with [facility] called stating that an attempt was made to see the pt yesterday and today, but unsuccessful. He was requesting verbal orders to extend the evaluation to next week.  Reviewed pt's chart, returned call for clarification, no answer, lf vm giving verbal orders.

## 2023-03-12 ENCOUNTER — Other Ambulatory Visit: Payer: Self-pay

## 2023-03-12 DIAGNOSIS — I7143 Infrarenal abdominal aortic aneurysm, without rupture: Secondary | ICD-10-CM

## 2023-03-12 NOTE — Addendum Note (Signed)
Addended by: Leilani Able, Xue Low A on: 03/12/2023 09:28 AM   Modules accepted: Orders

## 2023-03-23 ENCOUNTER — Ambulatory Visit (HOSPITAL_BASED_OUTPATIENT_CLINIC_OR_DEPARTMENT_OTHER): Payer: Medicare Other

## 2023-03-23 ENCOUNTER — Encounter (HOSPITAL_BASED_OUTPATIENT_CLINIC_OR_DEPARTMENT_OTHER): Payer: Self-pay | Admitting: Pulmonary Disease

## 2023-03-23 ENCOUNTER — Ambulatory Visit (HOSPITAL_BASED_OUTPATIENT_CLINIC_OR_DEPARTMENT_OTHER): Payer: Medicare Other | Admitting: Pulmonary Disease

## 2023-03-23 VITALS — BP 120/62 | HR 67 | Ht 66.0 in | Wt 226.0 lb

## 2023-03-23 DIAGNOSIS — R0609 Other forms of dyspnea: Secondary | ICD-10-CM

## 2023-03-23 DIAGNOSIS — G4733 Obstructive sleep apnea (adult) (pediatric): Secondary | ICD-10-CM | POA: Diagnosis not present

## 2023-03-23 MED ORDER — QVAR REDIHALER 40 MCG/ACT IN AERB: 2.0000 | INHALATION_SPRAY | Freq: Two times a day (BID) | RESPIRATORY_TRACT | 5 refills | Status: AC

## 2023-03-23 NOTE — Patient Instructions (Signed)
Chest xray and lab tests today  Will arrange for pulmonary function test  Will have [employer] refit your CPAP mask  Qvar two puffs in the morning and two puffs in the evening, and rinse your mouth after each use  Albuterol two puffs every 6 hours as needed for cough, wheeze, chest congestion, or shortness of breath  Follow up in 4 weeks with Dr. Craige Cotta or a Nurse Practitioner

## 2023-03-23 NOTE — Progress Notes (Signed)
[facility], [facility] Sleep Medicine  Chief Complaint  Patient presents with   Follow-up    82yr for OSA. States he has been using his cpap machine.     Constitutional:  BP 120/62   Pulse 67   Ht 5\' 6"  (1.676 m)   Wt 226 lb (102.5 kg)   SpO2 99% Comment: on RA  BMI 36.48 kg/m   Past Medical History:  Hypertension, Hyperlipidemia, Hypothyroidism, Coronary artery disease, Abdominal aortic aneurysm  Past Surgical History:  He  has a past surgical history that includes Colonoscopy (2006); Colon surgery (2006); Abdominal exploration surgery (~ 2006); Cataract extraction w/ intraocular lens  implant, bilateral (Bilateral); CORONARY BALLOON ANGIOPLASTY (N/A, 11/12/2017); CORONARY ANGIOGRAPHY (N/A, 11/12/2017); Hernia repair (~ 2006); Cardiac catheterization (N/A, 10/16/2016); Cardiac catheterization (N/A, 10/16/2016); Coronary angioplasty; Coronary angioplasty with stent (2008); Abdominal aortic endovascular stent graft (N/A, 02/25/2023); and Ultrasound guidance for vascular access (Bilateral, 02/25/2023).  Brief Summary:  Bryan Carter is a 82 y.o. male former smoker with obstructive sleep apnea.      Subjective:   He is here with his son.  Uses CPAP nightly.  Has full face mask.  Gets tangled in mask and sometimes comes off.  He has been getting mask leak.  He has noticed feeling short of breath for the past 6 to 8 months.  He has to stop and rest after walking about 1/4 block.  He has a dry cough.  Denies chest pain, palpitations, or dizziness.  He has been using albuterol daily, and this helps.    He had endovascular repair of AAA in May.   Physical Exam:   Appearance - well kempt   ENMT - no sinus tenderness, no oral exudate, no LAN, Mallampati 3 airway, no stridor  Respiratory - equal breath sounds bilaterally, no wheezing or rales  CV - s1s2 regular rate and rhythm, no murmurs  Ext - no clubbing, no edema  Skin - no rashes  Psych - normal mood and  affect     Pulmonary testing:  PFT 11/14/19 >> FEV1 2.44 (104%), FEV1% 82, TLC 5.19 (80%), DLCO 71%, no BD  Sleep Tests:  HST 11/09/19 >> AHI 42.6, SpO2 low 80%. Auto CPAP 12/23/22 to 03/22/23 >> used on 85 of 90 nights with average 3 hrs 55 min.  Average AHI 26.9 with median CPAP 13 and  95 th percentile CPAP 17 cm H2O.  Air leak noted.  Cardiac Tests:  Echo 08/06/16 >> EF 50 to 55%, grade 1 DD  Social History:  He  reports that he quit smoking about 16 years ago. His smoking use included cigarettes. He has a 50.00 pack-year smoking history. He has never used smokeless tobacco. He reports that he does not currently use alcohol. He reports current drug use.  Family History:  His family history includes Heart attack in his father and mother; Heart disease in his father and mother.     Labs:      Latest Ref Rng & Units 02/26/2023    5:10 AM 02/20/2023    8:59 AM 06/13/2021    4:01 PM  CMP  Glucose 70 - 99 mg/dL 782  956    BUN 8 - 23 mg/dL 28  31    Creatinine 2.13 - 1.24 mg/dL 0.86  5.78  4.69   Sodium 135 - 145 mmol/L 137  136    Potassium 3.5 - 5.1 mmol/L 4.2  3.9    Chloride 98 - 111 mmol/L 111  107    CO2 22 - 32 mmol/L 19  19    Calcium 8.9 - 10.3 mg/dL 8.9  9.0    Total Protein 6.5 - 8.1 g/dL  7.0    Total Bilirubin 0.3 - 1.2 mg/dL  0.5    Alkaline Phos 38 - 126 U/L  75    AST 15 - 41 U/L  25    ALT 0 - 44 U/L  21        Latest Ref Rng & Units 02/26/2023    5:10 AM 02/20/2023    8:59 AM 09/02/2019    1:17 PM  CBC  WBC 4.0 - 10.5 K/uL 9.7  6.0  12.1   Hemoglobin 13.0 - 17.0 g/dL 9.8  16.1  09.6   Hematocrit 39.0 - 52.0 % 29.3  34.8  33.0   Platelets 150 - 400 K/uL 187  212  171    Assessment/Plan:   Obstructive sleep apnea. - he is compliant with therapy and reports benefit - he uses [employer] for his DME - current CPAP ordered February 2021 - continue auto CPAP 5 to 20 cm H2O - will arrange for mask refitting to see if this improves air leak  Dyspnea on  exertion. - might be related to asthma - will try him on Qvar and continue prn albuterol - will arrange for a chest xray - might need further cardiac assessment; has appointment with Dr. Sharyn Lull in July - he had post-operative anemia after endovascular AAA repair; will repeat lab work today  Obesity. - he is aware of how his weight can impact his health, particularly in relation to sleep apena  Time Spent Involved in Patient Care on Day of Examination:  37 minutes  Follow up:   Patient Instructions  Chest xray and lab tests today  Will arrange for pulmonary function test  Will have [employer] refit your CPAP mask  Qvar two puffs in the morning and two puffs in the evening, and rinse your mouth after each use  Albuterol two puffs every 6 hours as needed for cough, wheeze, chest congestion, or shortness of breath  Follow up in 4 weeks with Dr. Craige Cotta or a Nurse Practitioner  Medication List:   Allergies as of 03/23/2023       Reactions   Quinapril Shortness Of Breath, Cough        Medication List        Accurate as of March 23, 2023  8:50 AM. If you have any questions, ask your nurse or doctor.          albuterol 108 (90 Base) MCG/ACT inhaler Commonly known as: VENTOLIN HFA Inhale 2 puffs into the lungs every 6 (six) hours as needed for wheezing or shortness of breath.   amLODipine 5 MG tablet Commonly known as: NORVASC Take 5 mg by mouth daily.   atorvastatin 40 MG tablet Commonly known as: LIPITOR Take 40 mg by mouth daily.   carvedilol 25 MG tablet Commonly known as: COREG Take 25 mg by mouth 2 (two) times daily.   Cyanocobalamin 5000 MCG Tbdp Take 5,000 mcg by mouth daily.   finasteride 5 MG tablet Commonly known as: PROSCAR Take 5 mg by mouth at bedtime.   FISH OIL PO Take 1 capsule by mouth 2 (two) times daily.   furosemide 40 MG tablet Commonly known as: LASIX Take 40 mg by mouth daily.   levothyroxine 75 MCG tablet Commonly known as:  SYNTHROID Take 75 mcg by mouth daily  before breakfast.   losartan 100 MG tablet Commonly known as: COZAAR Take 100 mg by mouth daily.   multivitamin tablet Take 1 tablet daily by mouth.   nitroGLYCERIN 0.4 MG SL tablet Commonly known as: Nitrostat Place 1 tablet (0.4 mg total) under the tongue every 5 (five) minutes as needed for chest pain.   NON FORMULARY Pt uses a cpap nightly   oxyCODONE-acetaminophen 5-325 MG tablet Commonly known as: PERCOCET/ROXICET Take 1 tablet by mouth every 6 (six) hours as needed for moderate pain.   Qvar RediHaler 40 MCG/ACT inhaler Generic drug: beclomethasone Inhale 2 puffs into the lungs 2 (two) times daily. Started by: Coralyn Helling, MD   spironolactone 25 MG tablet Commonly known as: ALDACTONE Take 1 tablet (25 mg total) by mouth daily.   tamsulosin 0.4 MG Caps capsule Commonly known as: FLOMAX Take 0.4 mg by mouth at bedtime.   ticagrelor 90 MG Tabs tablet Commonly known as: BRILINTA Take 1 tablet (90 mg total) by mouth 2 (two) times daily.   VITAMIN D3 PO Take 1 tablet by mouth daily.        Signature:  Coralyn Helling, MD [facility] Pulmonary/Critical Care Pager - [phone] 03/23/2023, 8:50 AM

## 2023-03-24 LAB — BASIC METABOLIC PANEL
BUN/Creatinine Ratio: 21 (ref 10–24)
BUN: 33 mg/dL — ABNORMAL HIGH (ref 8–27)
CO2: 20 mmol/L (ref 20–29)
Calcium: 9.1 mg/dL (ref 8.6–10.2)
Chloride: 104 mmol/L (ref 96–106)
Creatinine, Ser: 1.59 mg/dL — ABNORMAL HIGH (ref 0.76–1.27)
Glucose: 104 mg/dL — ABNORMAL HIGH (ref 70–99)
Potassium: 4.4 mmol/L (ref 3.5–5.2)
Sodium: 140 mmol/L (ref 134–144)
eGFR: 43 mL/min/{1.73_m2} — ABNORMAL LOW (ref >59)

## 2023-03-24 LAB — CBC WITH DIFFERENTIAL/PLATELET
Basophils Absolute: 0 10*3/uL (ref 0.0–0.2)
Basos: 0 %
EOS (ABSOLUTE): 0.3 10*3/uL (ref 0.0–0.4)
Eos: 5 %
Hematocrit: 33.4 % — ABNORMAL LOW (ref 37.5–51.0)
Hemoglobin: 10.8 g/dL — ABNORMAL LOW (ref 13.0–17.7)
Immature Grans (Abs): 0 10*3/uL (ref 0.0–0.1)
Immature Granulocytes: 0 %
Lymphocytes Absolute: 1.3 10*3/uL (ref 0.7–3.1)
Lymphs: 23 %
MCH: 28.6 pg (ref 26.6–33.0)
MCHC: 32.3 g/dL (ref 31.5–35.7)
MCV: 89 fL (ref 79–97)
Monocytes Absolute: 0.7 10*3/uL (ref 0.1–0.9)
Monocytes: 12 %
Neutrophils Absolute: 3.3 10*3/uL (ref 1.4–7.0)
Neutrophils: 60 %
Platelets: 200 10*3/uL (ref 150–450)
RBC: 3.77 x10E6/uL — ABNORMAL LOW (ref 4.14–5.80)
RDW: 14.8 % (ref 11.6–15.4)
WBC: 5.5 10*3/uL (ref 3.4–10.8)

## 2023-03-31 ENCOUNTER — Telehealth: Payer: Self-pay | Admitting: Surgery

## 2023-03-31 NOTE — Telephone Encounter (Signed)
Lvm to see if pt can schedule CT scan needed for MD visit on 7/1

## 2023-04-06 ENCOUNTER — Encounter: Payer: Medicare Other | Admitting: Surgery

## 2023-04-07 ENCOUNTER — Telehealth (HOSPITAL_BASED_OUTPATIENT_CLINIC_OR_DEPARTMENT_OTHER): Payer: Self-pay | Admitting: Pulmonary Disease

## 2023-04-07 ENCOUNTER — Ambulatory Visit (HOSPITAL_BASED_OUTPATIENT_CLINIC_OR_DEPARTMENT_OTHER)
Admission: RE | Admit: 2023-04-07 | Discharge: 2023-04-07 | Disposition: A | Discharge status: Home / Self Care | Payer: Medicare Other | Source: Ambulatory Visit | Attending: Surgery | Admitting: Surgery

## 2023-04-07 DIAGNOSIS — I7143 Infrarenal abdominal aortic aneurysm, without rupture: Secondary | ICD-10-CM | POA: Insufficient documentation

## 2023-04-07 MED ORDER — IOHEXOL 350 MG/ML SOLN
100.0000 mL | Freq: Once | INTRAVENOUS | Status: AC | PRN
  Administered 2023-04-07: 100 mL via INTRAVENOUS

## 2023-04-08 MED ORDER — AEROCHAMBER MV MISC: 0 refills | Status: AC

## 2023-04-08 NOTE — Telephone Encounter (Signed)
Called and spoke with patient. Advised patient I sent an order to his pharmacy for the spacer. Hye verbalized understanding.   Nothing further needed.

## 2023-04-13 ENCOUNTER — Ambulatory Visit (INDEPENDENT_AMBULATORY_CARE_PROVIDER_SITE_OTHER): Payer: Medicare Other | Admitting: Surgery

## 2023-04-13 ENCOUNTER — Encounter: Payer: Self-pay | Admitting: Surgery

## 2023-04-13 VITALS — BP 130/76 | HR 66 | Temp 97.7°F | Resp 20 | Ht 66.0 in | Wt 225.0 lb

## 2023-04-13 DIAGNOSIS — I7143 Infrarenal abdominal aortic aneurysm, without rupture: Secondary | ICD-10-CM

## 2023-04-13 NOTE — Progress Notes (Signed)
Patient name: Bryan Carter MRN: [id number] DOB: Apr 19, 1941 Sex: male  REASON FOR VISIT:     Post op  HISTORY OF PRESENT ILLNESS:   Bryan Carter is a 82 y.o. male who I have been following for an enlarging infrarenal abdominal aortic aneurysm.  It increased in size to 5.6 cm and on 02/25/2023, he underwent endovascular repair.  He was discharged home on postoperative day #1.  He is back today for follow-up  The patient has a history of prostate cancer.  He has a history of undergoing a colonoscopy complicated by perforation requiring colon resection.  This was done through a midline incision.  He also developed a hernia and has had a incisional hernia performed with mesh.   The patient has a history of coronary artery disease.  He has had a heart attack and has undergone coronary stenting 2.  He is medically managed for hypercholesterolemia with a statin.  He quit smoking approximately 8 years ago when he had his heart attack.  He does complain of bilateral leg swelling.  He does suffer from chronic renal insufficiency  CURRENT MEDICATIONS:    Current Outpatient Medications  Medication Sig Dispense Refill   albuterol (VENTOLIN HFA) 108 (90 Base) MCG/ACT inhaler Inhale 2 puffs into the lungs every 6 (six) hours as needed for wheezing or shortness of breath.     amLODipine (NORVASC) 5 MG tablet Take 5 mg by mouth daily.     atorvastatin (LIPITOR) 40 MG tablet Take 40 mg by mouth daily.     beclomethasone (QVAR REDIHALER) 40 MCG/ACT inhaler Inhale 2 puffs into the lungs 2 (two) times daily. 1 each 5   carvedilol (COREG) 25 MG tablet Take 25 mg by mouth 2 (two) times daily.     Cholecalciferol (VITAMIN D3 PO) Take 1 tablet by mouth daily.     Cyanocobalamin 5000 MCG TBDP Take 5,000 mcg by mouth daily.     finasteride (PROSCAR) 5 MG tablet Take 5 mg by mouth at bedtime.      furosemide (LASIX) 40 MG tablet Take 40 mg by mouth daily.     levothyroxine  (SYNTHROID) 75 MCG tablet Take 75 mcg by mouth daily before breakfast.     losartan (COZAAR) 100 MG tablet Take 100 mg by mouth daily.     Multiple Vitamin (MULTIVITAMIN) tablet Take 1 tablet daily by mouth.     NON FORMULARY Pt uses a cpap nightly     Omega-3 Fatty Acids (FISH OIL PO) Take 1 capsule by mouth 2 (two) times daily.      oxyCODONE-acetaminophen (PERCOCET/ROXICET) 5-325 MG tablet Take 1 tablet by mouth every 6 (six) hours as needed for moderate pain. 12 tablet 0   Spacer/Aero-Holding Chambers (AEROCHAMBER MV) inhaler Use as instructed 1 each 0   spironolactone (ALDACTONE) 25 MG tablet Take 1 tablet (25 mg total) by mouth daily. 30 tablet 0   tamsulosin (FLOMAX) 0.4 MG CAPS capsule Take 0.4 mg by mouth at bedtime.      ticagrelor (BRILINTA) 90 MG TABS tablet Take 1 tablet (90 mg total) by mouth 2 (two) times daily. 60 tablet 3   nitroGLYCERIN (NITROSTAT) 0.4 MG SL tablet Place 1 tablet (0.4 mg total) under the tongue every 5 (five) minutes as needed for chest pain. 100 tablet 3   No current facility-administered medications for this visit.    REVIEW OF SYSTEMS:   [X]  denotes positive finding, [ ]  denotes negative finding Cardiac  Comments:  Chest pain  or chest pressure:    Shortness of breath upon exertion:    Short of breath when lying flat:    Irregular heart rhythm:    Constitutional    Fever or chills:      PHYSICAL EXAM:   Vitals:   04/13/23 1418  BP: 130/76  Pulse: 66  Resp: 20  Temp: 97.7 F (36.5 C)  SpO2: 95%  Weight: 225 lb (102.1 kg)  Height: 5\' 6"  (1.676 m)    GENERAL: The patient is a well-nourished male, in no acute distress. The vital signs are documented above. CARDIOVASCULAR: There is a regular rate and rhythm. PULMONARY: Non-labored respirations   STUDIES:   CT angiogram:  IMPRESSION: 1. Interval placement of bifurcated aortic stent graft, which is patent. No endoleak. 2. Stable native sac diameter 4.9 cm. 3. Chronic origin occlusion  of right SFA. 4. Colonic diverticulosis. 5.  Aortic Atherosclerosis (ICD10-I70.0).  MEDICAL ISSUES:   AAA: Successful exclusion of infrarenal abdominal aortic aneurysm measuring 5.6 cm.  CT scan shows sac diameter of 4.9 cm without endoleak.  He has no complaints today.  I will see him back in 6 months with an ultrasound for follow-up  Charlena Cross, MD, FACS [facility] [phone] Pager [phone]

## 2023-04-16 ENCOUNTER — Other Ambulatory Visit: Payer: Self-pay | Admitting: Internal Medicine

## 2023-04-17 LAB — COMPLETE METABOLIC PANEL WITH GFR
AG Ratio: 1.2 (calc) (ref 1.0–2.5)
ALT: 13 U/L (ref 9–46)
AST: 18 U/L (ref 10–35)
Albumin: 3.7 g/dL (ref 3.6–5.1)
Alkaline phosphatase (APISO): 82 U/L (ref 35–144)
BUN/Creatinine Ratio: 21 (calc) (ref 6–22)
BUN: 32 mg/dL — ABNORMAL HIGH (ref 7–25)
CO2: 20 mmol/L (ref 20–32)
Calcium: 9.2 mg/dL (ref 8.6–10.3)
Chloride: 111 mmol/L — ABNORMAL HIGH (ref 98–110)
Creat: 1.56 mg/dL — ABNORMAL HIGH (ref 0.70–1.22)
Globulin: 3.1 g/dL (calc) (ref 1.9–3.7)
Glucose, Bld: 120 mg/dL — ABNORMAL HIGH (ref 65–99)
Potassium: 3.9 mmol/L (ref 3.5–5.3)
Sodium: 142 mmol/L (ref 135–146)
Total Bilirubin: 0.4 mg/dL (ref 0.2–1.2)
Total Protein: 6.8 g/dL (ref 6.1–8.1)
eGFR: 44 mL/min/{1.73_m2} — ABNORMAL LOW (ref >=60)

## 2023-04-17 LAB — LIPID PANEL
Cholesterol: 143 mg/dL (ref <200)
HDL: 47 mg/dL (ref >=40)
LDL Cholesterol (Calc): 78 mg/dL (calc)
Non-HDL Cholesterol (Calc): 96 mg/dL (calc) (ref <130)
Total CHOL/HDL Ratio: 3 (calc) (ref <5.0)
Triglycerides: 96 mg/dL (ref <150)

## 2023-04-17 LAB — CBC
HCT: 31.9 % — ABNORMAL LOW (ref 38.5–50.0)
Hemoglobin: 10.4 g/dL — ABNORMAL LOW (ref 13.2–17.1)
MCH: 29.7 pg (ref 27.0–33.0)
MCHC: 32.6 g/dL (ref 32.0–36.0)
MCV: 91.1 fL (ref 80.0–100.0)
MPV: 9.6 fL (ref 7.5–12.5)
Platelets: 171 10*3/uL (ref 140–400)
RBC: 3.5 10*6/uL — ABNORMAL LOW (ref 4.20–5.80)
RDW: 15.7 % — ABNORMAL HIGH (ref 11.0–15.0)
WBC: 6 10*3/uL (ref 3.8–10.8)

## 2023-04-17 LAB — T4, FREE: Free T4: 0.9 ng/dL (ref 0.8–1.8)

## 2023-04-17 LAB — TSH: TSH: 4.91 mIU/L — ABNORMAL HIGH (ref 0.40–4.50)

## 2023-04-28 ENCOUNTER — Other Ambulatory Visit: Payer: Self-pay

## 2023-04-28 DIAGNOSIS — I7143 Infrarenal abdominal aortic aneurysm, without rupture: Secondary | ICD-10-CM

## 2023-05-21 ENCOUNTER — Encounter (HOSPITAL_BASED_OUTPATIENT_CLINIC_OR_DEPARTMENT_OTHER): Payer: Self-pay | Admitting: Emergency Medicine

## 2023-05-21 ENCOUNTER — Emergency Department (HOSPITAL_BASED_OUTPATIENT_CLINIC_OR_DEPARTMENT_OTHER): Payer: Medicare Other

## 2023-05-21 ENCOUNTER — Emergency Department (HOSPITAL_BASED_OUTPATIENT_CLINIC_OR_DEPARTMENT_OTHER)
Admission: EM | Admit: 2023-05-21 | Discharge: 2023-05-21 | Disposition: A | Discharge status: Home / Self Care | Payer: Medicare Other | Attending: Emergency Medicine | Admitting: Emergency Medicine

## 2023-05-21 ENCOUNTER — Other Ambulatory Visit: Payer: Self-pay

## 2023-05-21 DIAGNOSIS — M25421 Effusion, right elbow: Secondary | ICD-10-CM | POA: Diagnosis present

## 2023-05-21 DIAGNOSIS — M7021 Olecranon bursitis, right elbow: Secondary | ICD-10-CM | POA: Diagnosis not present

## 2023-05-21 DIAGNOSIS — Y939 Activity, unspecified: Secondary | ICD-10-CM | POA: Diagnosis not present

## 2023-05-21 MED ORDER — PREDNISONE 20 MG PO TABS
40.0000 mg | ORAL_TABLET | Freq: Once | ORAL | Status: AC
  Administered 2023-05-21: 40 mg via ORAL
  Filled 2023-05-21: qty 2

## 2023-05-21 MED ORDER — PREDNISONE 10 MG PO TABS: ORAL_TABLET | ORAL | 0 refills | Status: AC

## 2023-05-21 NOTE — Discharge Instructions (Addendum)
Take the steroid medication to help with the swelling in your elbow.  Return to the ER if you start having increasing pain fevers or other concerns.  Follow-up with an orthopedic doctor for further evaluation to be rechecked.

## 2023-05-21 NOTE — ED Triage Notes (Signed)
Right elbow swelling x 2-3 weeks. Feels Fluid filled Denies increased pain at joint Denies injury

## 2023-05-21 NOTE — ED Provider Notes (Signed)
[place] EMERGENCY DEPARTMENT AT [facility] Provider Note   CSN: [id number] Arrival date & time: 05/21/23  1746     History  Chief Complaint  Patient presents with   Joint Swelling    Bryan Carter is a 82 y.o. male.  HPI   Patient since ED for evaluation of right elbow swelling.  Patient states he started having symptoms a few weeks ago.  He was actually much worse initially and has been decreasing in size.  Patient denies any recent falls or injury.  He is not having any fevers or chills.  He has not had this type of trouble before.  He denies any repetitive trauma to his elbow  Home Medications Prior to Admission medications   Medication Sig Start Date End Date Taking? Authorizing Provider  predniSONE (DELTASONE) 10 MG tablet Take 4 tablets (40 mg total) by mouth daily with breakfast for 2 days, THEN 3 tablets (30 mg total) daily with breakfast for 2 days, THEN 2 tablets (20 mg total) daily with breakfast for 2 days, THEN 1 tablet (10 mg total) daily with breakfast for 2 days. 05/21/23 05/28/23 Yes Linwood Dibbles, MD  albuterol (VENTOLIN HFA) 108 (90 Base) MCG/ACT inhaler Inhale 2 puffs into the lungs every 6 (six) hours as needed for wheezing or shortness of breath.    [provider]  amLODipine (NORVASC) 5 MG tablet Take 5 mg by mouth daily. 08/09/19   [provider]  atorvastatin (LIPITOR) 40 MG tablet Take 40 mg by mouth daily. 09/07/20   [provider]  beclomethasone (QVAR REDIHALER) 40 MCG/ACT inhaler Inhale 2 puffs into the lungs 2 (two) times daily. 03/23/23   Coralyn Helling, MD  carvedilol (COREG) 25 MG tablet Take 25 mg by mouth 2 (two) times daily. 08/05/19   [provider]  Cholecalciferol (VITAMIN D3 PO) Take 1 tablet by mouth daily.    [provider]  Cyanocobalamin 5000 MCG TBDP Take 5,000 mcg by mouth daily.    [provider]  finasteride (PROSCAR) 5 MG tablet Take 5 mg by mouth at bedtime.     [provider]  furosemide (LASIX) 40 MG tablet Take 40 mg by mouth daily. 08/19/19   [provider]  levothyroxine (SYNTHROID) 75 MCG tablet Take 75 mcg by mouth daily before breakfast. 12/27/19   [provider]  losartan (COZAAR) 100 MG tablet Take 100 mg by mouth daily.    [provider]  Multiple Vitamin (MULTIVITAMIN) tablet Take 1 tablet daily by mouth.    [provider]  nitroGLYCERIN (NITROSTAT) 0.4 MG SL tablet Place 1 tablet (0.4 mg total) under the tongue every 5 (five) minutes as needed for chest pain. 11/13/17 02/19/23  Rinaldo Cloud, MD  NON FORMULARY Pt uses a cpap nightly    [provider]  Omega-3 Fatty Acids (FISH OIL PO) Take 1 capsule by mouth 2 (two) times daily.     [provider]  oxyCODONE-acetaminophen (PERCOCET/ROXICET) 5-325 MG tablet Take 1 tablet by mouth every 6 (six) hours as needed for moderate pain. 02/26/23   Lars Mage, PA-C  Spacer/Aero-Holding Chambers (AEROCHAMBER MV) inhaler Use as instructed 04/08/23   Coralyn Helling, MD  spironolactone (ALDACTONE) 25 MG tablet Take 1 tablet (25 mg total) by mouth daily. 09/06/19   Dhungel, Theda Belfast, MD  tamsulosin (FLOMAX) 0.4 MG CAPS capsule Take 0.4 mg by mouth at bedtime.     [provider]  ticagrelor (BRILINTA) 90 MG TABS tablet  Take 1 tablet (90 mg total) by mouth 2 (two) times daily. 10/17/16   Rinaldo Cloud, MD      Allergies    Quinapril    Review of Systems   Review of Systems  Physical Exam Updated Vital Signs BP (!) 151/78   Pulse 61   Temp 97.6 F (36.4 C) (Oral)   Resp 18   SpO2 98%  Physical Exam Vitals and nursing note reviewed.  Constitutional:      General: He is not in acute distress.    Appearance: He is well-developed.  HENT:     Head: Normocephalic and atraumatic.     Right Ear: External ear normal.     Left Ear: External ear normal.  Eyes:     General: No scleral icterus.       Right eye: No discharge.        Left  eye: No discharge.     Conjunctiva/sclera: Conjunctivae normal.  Neck:     Trachea: No tracheal deviation.  Cardiovascular:     Rate and Rhythm: Normal rate.  Pulmonary:     Effort: Pulmonary effort is normal. No respiratory distress.     Breath sounds: No stridor.  Abdominal:     General: There is no distension.  Musculoskeletal:        General: Swelling present. No deformity.     Right elbow: Swelling present. Normal range of motion. No tenderness.     Cervical back: Neck supple.     Comments: Swelling overlying the olecranon bursa, soft, no erythema, no tenderness, no lymphangitic streaking  Skin:    General: Skin is warm and dry.     Findings: No rash.  Neurological:     Mental Status: He is alert. Mental status is at baseline.     Cranial Nerves: No dysarthria or facial asymmetry.     Motor: No seizure activity.     ED Results / Procedures / Treatments   Labs (all labs ordered are listed, but only abnormal results are displayed) Labs Reviewed - No data to display  EKG None  Radiology DG Elbow Complete Right  Result Date: 05/21/2023 CLINICAL DATA:  Soft tissue swelling about the elbow, no known injury, initial encounter EXAM: RIGHT ELBOW - COMPLETE 3+ VIEW COMPARISON:  None Available. FINDINGS: No acute fracture or dislocation is noted. No joint effusion is seen. Soft tissue swelling is noted over the olecranon posteriorly likely related to olecranon bursitis. No other focal abnormality is noted. IMPRESSION: Soft tissue swelling along the posterior elbow. Electronically Signed   By: Alcide Clever M.D.   On: 05/21/2023 20:56    Procedures Procedures    Medications Ordered in ED Medications  predniSONE (DELTASONE) tablet 40 mg (has no administration in time range)    ED Course/ Medical Decision Making/ A&P                                 Medical Decision Making Problems Addressed: Olecranon bursitis of right elbow: acute illness or injury that poses a threat to  life or bodily functions  Amount and/or Complexity of Data Reviewed Radiology: ordered and independent interpretation performed.  Risk Prescription drug management.   Patient presented to ED with complaints of elbow swelling.  Patient has full range of motion of the joint.  I doubt septic arthritis.  There is no recent trauma.  X-ray does not show any signs of fracture.  Patient  does have swelling of the olecranon bursa.  He states it has been improving.  I doubt infectious etiology there is no erythema or tenderness.  Will have him try course of steroids.  Outpatient follow-up with orthopedics.        Final Clinical Impression(s) / ED Diagnoses Final diagnoses:  Olecranon bursitis of right elbow    Rx / DC Orders ED Discharge Orders          Ordered    predniSONE (DELTASONE) 10 MG tablet  Q breakfast        05/21/23 2130              Linwood Dibbles, MD 05/21/23 2133

## 2023-06-09 ENCOUNTER — Encounter (HOSPITAL_BASED_OUTPATIENT_CLINIC_OR_DEPARTMENT_OTHER): Payer: Self-pay | Admitting: Pulmonary Disease

## 2023-06-09 ENCOUNTER — Ambulatory Visit (HOSPITAL_BASED_OUTPATIENT_CLINIC_OR_DEPARTMENT_OTHER): Payer: Medicare Other | Admitting: Pulmonary Disease

## 2023-07-31 IMAGING — CT CT ABD-PELV W/O CM
2 of 5 series · 16 of 46 positions shown, 18 images · non-contrast
Comparison: CT Abdomen Pelvis, 08/31/2019. CTA chest abdomen
pelvis, 08/13/2015. Aorta ultrasound, 04/26/2014.

CLINICAL DATA: AAA surveillance.  Follow-up.

EXAM:
CT ABDOMEN AND PELVIS WITHOUT CONTRAST
TECHNIQUE: Multidetector CT imaging of the abdomen and pelvis was performed
following the standard protocol without IV contrast.

[Series 4: (id) · axial · 0.98mm/px · z∈[+991,+1417]mm · 13 of 493 slices shown, 15 images]
[im 22/493  soft-tissue]
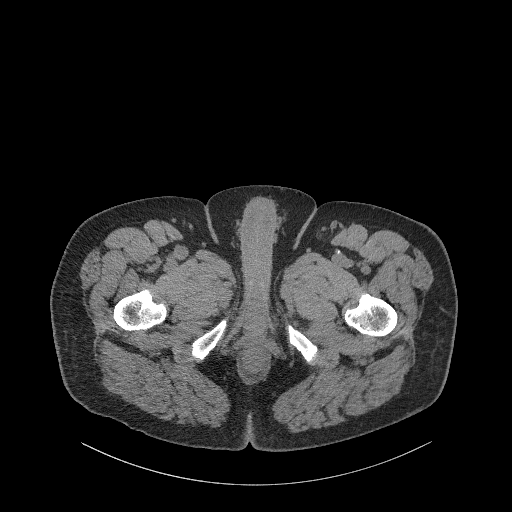
[im 22/493  bone]
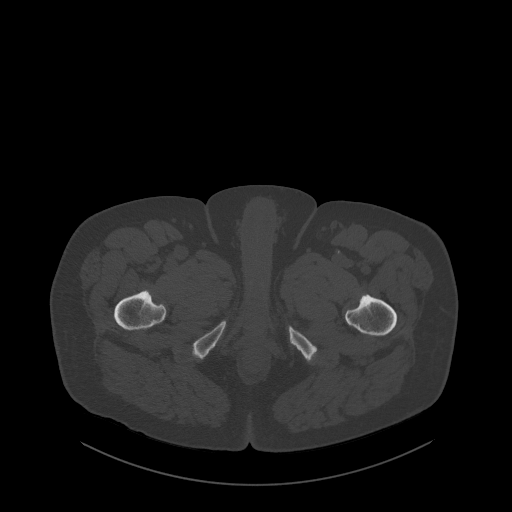
[im 65/493  soft-tissue]
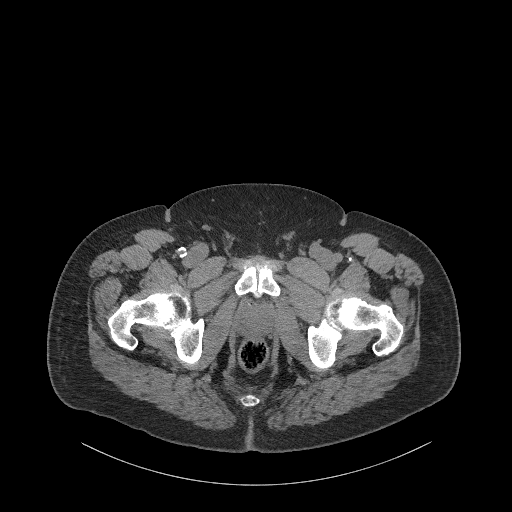
[im 107/493  soft-tissue]
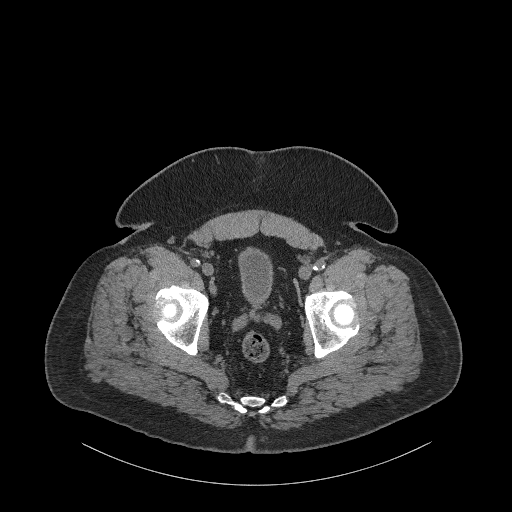
[im 129/493  soft-tissue]
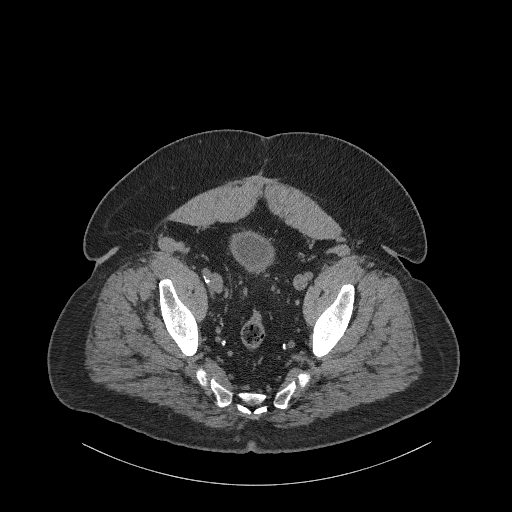
[im 172/493  soft-tissue]
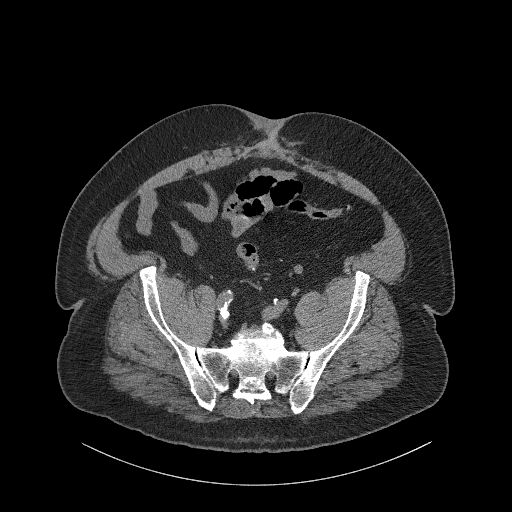
[im 214/493  soft-tissue]
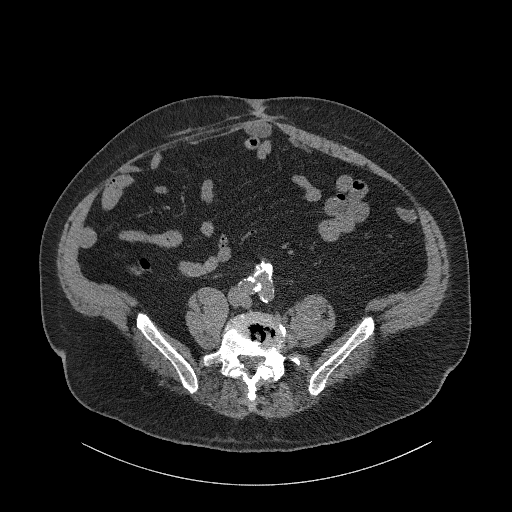
[im 257/493  soft-tissue]
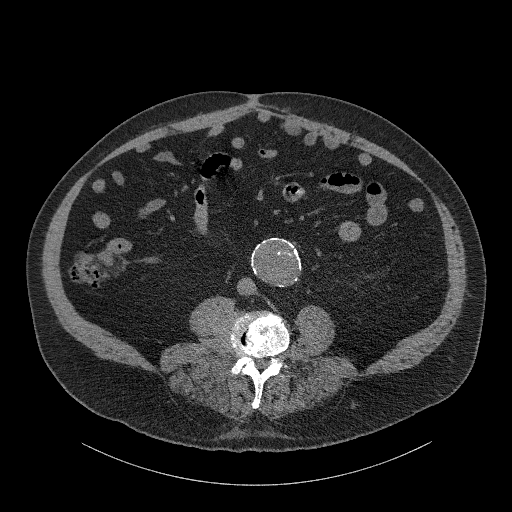
[im 279/493  soft-tissue]
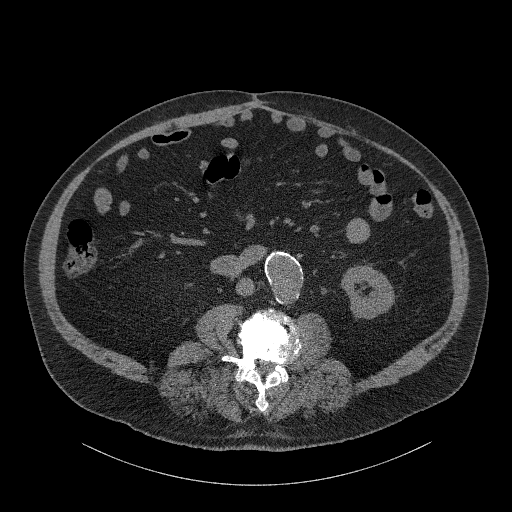
[im 321/493  soft-tissue]
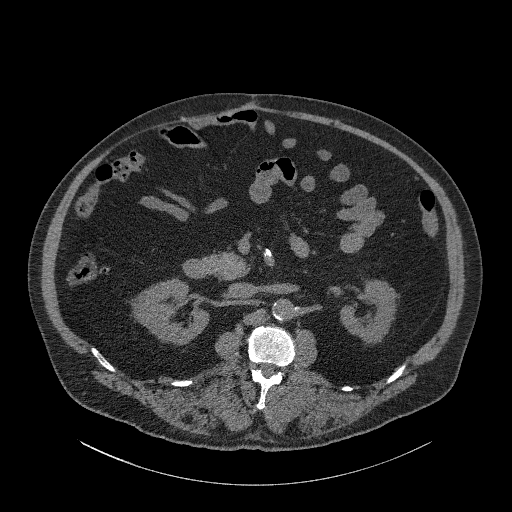
[im 321/493  bone]
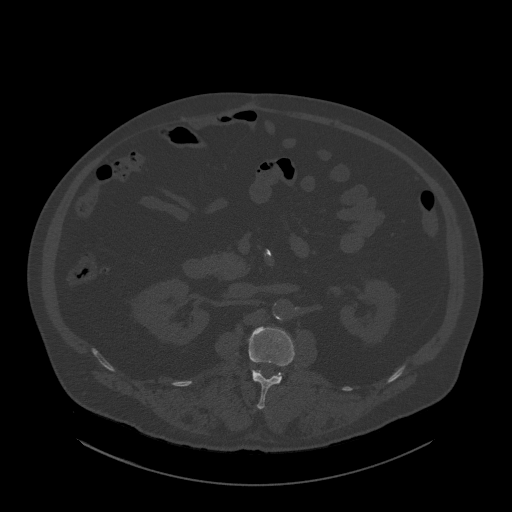
[im 364/493  soft-tissue]
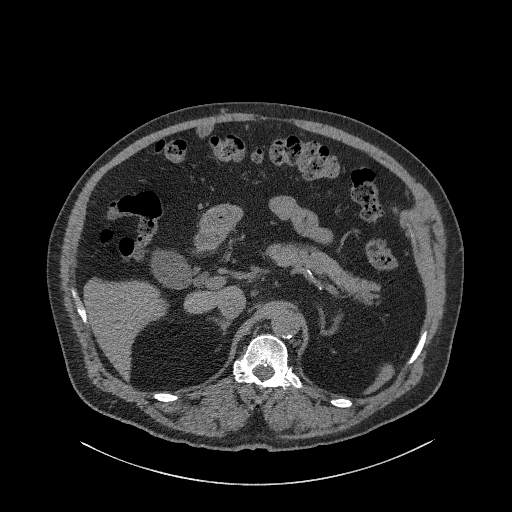
[im 386/493  soft-tissue]
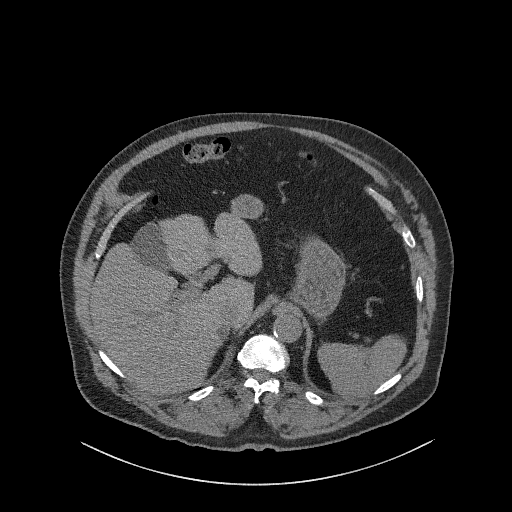
[im 428/493  soft-tissue]
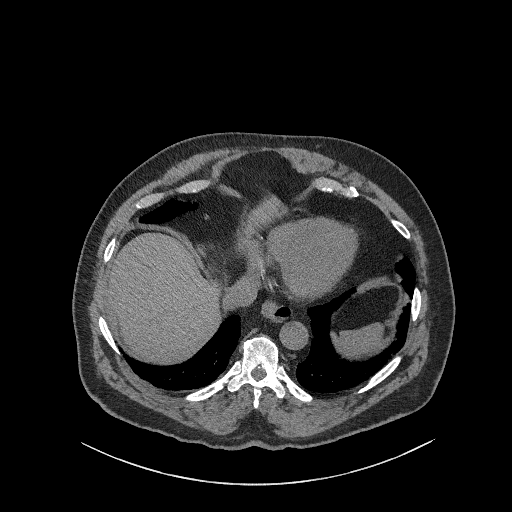
[im 471/493  soft-tissue]
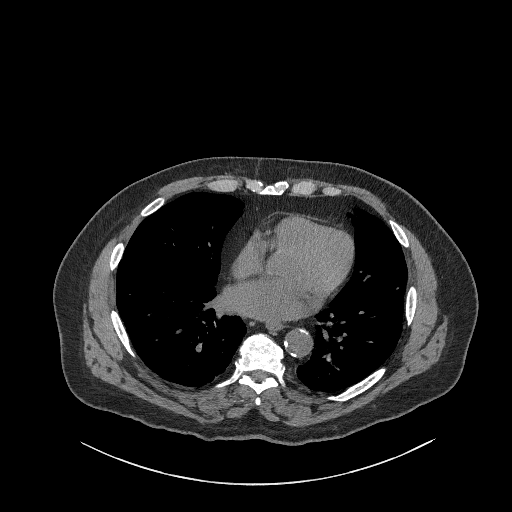

[Series 5: coronal st · coronal · 0.99mm/px · 3 of 111 slices shown]
[im 37/111  soft-tissue]
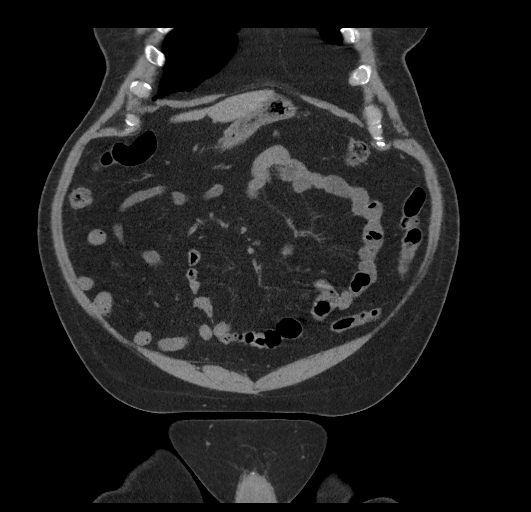
[im 49/111  soft-tissue]
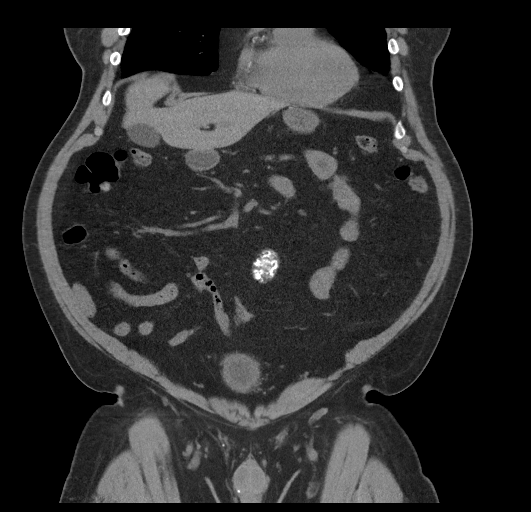
[im 62/111  soft-tissue]
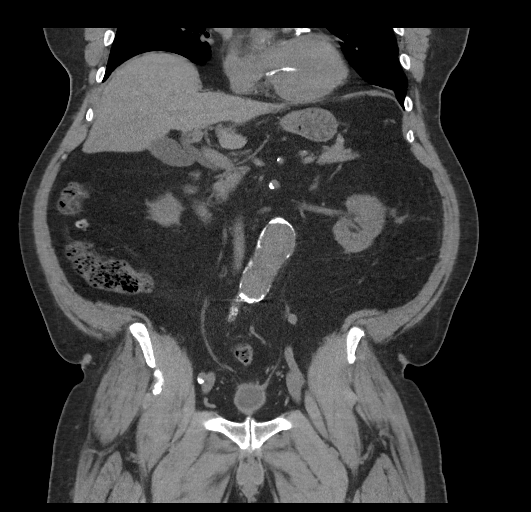

[16 of 46 positions shown; findings below may reference images not displayed]

FINDINGS: Lower chest: Severe burden of coronary calcifications. Trace
lingular atelectasis.

Hepatobiliary: 2 cm posterior LEFT hepatic lobe hypodense lesion,
likely a cyst. No gallstones, gallbladder wall thickening, or
biliary dilatation.

Pancreas: Normal noncontrast appearance. No pancreatic ductal
dilatation or surrounding inflammatory changes.

Spleen: Normal in size without focal abnormality.

Adrenals/Urinary Tract: Adrenal glands are unremarkable. Normal
noncontrast appearance of the kidneys. No renal calculi, or
hydronephrosis. Bladder is unremarkable.

Stomach/Bowel: Small hiatus hernia. Nondistended small bowel.
Appendix appears normal. Nondilated colon. Colonic diverticulosis.
Post-operative changes of sigmoid resection. No evidence of bowel
wall thickening, distention, or inflammatory changes.

Vascular/Lymphatic:

Severe abdominal aortic atherosclerosis.

Calcified infrarenal abdominal aortic aneurysm, measuring up to
x 4.4 by 6.8 cm (previously 4.5 cm when measured at the same
location)

No enlarged abdominal or pelvic lymph nodes.

Reproductive: Prostate is unremarkable.

Other: No abdominal wall hernia. Midline abdominal scarring. No
abdominopelvic ascites.

Musculoskeletal: No acute osseous findings. Multilevel degenerative
changes spine, disc space narrowing, endplate sclerosis, anterior
osteophytosis, levoconvex lumbar curvature L5 pars defect and grade
1 anterolisthesis of L5 on S1.
IMPRESSION: 1. Interval enlargement of calcified infrarenal abdominal aortic
aneurysm, now measuring up to 4.9 cm (previously 4.5 cm in [DATE]).
Aortic Atherosclerosis ([id number].K). Recommend follow-up CT/MR
every 6 months and vascular consultation. This recommendation
follows ACR consensus guidelines: White Paper of the ACR Incidental
Findings Committee II on Vascular Findings. [HOSPITAL] 2520;
[DATE].
2. No acute findings within abdomen or pelvis.
3. Chronic and senescent changes, as above.

## 2023-10-19 ENCOUNTER — Ambulatory Visit (HOSPITAL_COMMUNITY)
Admission: RE | Admit: 2023-10-19 | Discharge: 2023-10-19 | Disposition: A | Discharge status: Home / Self Care | Payer: Medicare Other | Source: Ambulatory Visit | Attending: Surgery | Admitting: Surgery

## 2023-10-19 ENCOUNTER — Ambulatory Visit (INDEPENDENT_AMBULATORY_CARE_PROVIDER_SITE_OTHER): Payer: Medicare Other | Admitting: Surgery

## 2023-10-19 ENCOUNTER — Encounter: Payer: Self-pay | Admitting: Surgery

## 2023-10-19 VITALS — BP 151/64 | HR 63 | Temp 97.7°F | Resp 20 | Ht 66.0 in | Wt 231.4 lb

## 2023-10-19 DIAGNOSIS — I739 Peripheral vascular disease, unspecified: Secondary | ICD-10-CM

## 2023-10-19 DIAGNOSIS — I7143 Infrarenal abdominal aortic aneurysm, without rupture: Secondary | ICD-10-CM | POA: Insufficient documentation

## 2023-10-19 NOTE — Progress Notes (Signed)
 [facility] [place]  Patient name: Bryan Carter MRN: [id number] DOB: 30-Nov-1940 Sex: male   REASON FOR VISIT:    Follow-up  HISOTRY OF PRESENT ILLNESS:    Bryan Carter is a 83 y.o. male who is status post an original repair on 02/25/2023 for a 5.6 cm infrarenal aneurysm.  He was discharged home on postoperative day #1.  His initial CT scan showed successful exclusion of his aneurysm with no evidence of endoleak.  He is back today for follow-up.  He does not have any complaints.  He has no back pain or abdominal pain.  He does not have any claudication symptoms.  He does not have any nonhealing wounds.   The patient has a history of prostate cancer.  He has a history of undergoing a colonoscopy complicated by perforation requiring colon resection.  This was done through a midline incision.  He also developed a hernia and has had a incisional hernia performed with mesh.   The patient has a history of coronary artery disease.  He has had a heart attack and has undergone coronary stenting 2.  He is medically managed for hypercholesterolemia with a statin.  He quit smoking approximately 8 years ago when he had his heart attack.  He does complain of bilateral leg swelling.  He does suffer from chronic renal insufficiency   PAST MEDICAL HISTORY:   Past Medical History:  Diagnosis Date   AAA (abdominal aortic aneurysm) (HCC)    Arthritis    joints, back (11/12/2017)   Coronary artery disease    Hyperlipidemia    Hypertension    Hypothyroidism    Myocardial infarction [facility]) 2008   Sleep apnea 2019     FAMILY HISTORY:   Family History  Problem Relation Age of Onset   Heart attack Mother    Heart disease Mother    Heart attack Father    Heart disease Father     SOCIAL HISTORY:   Social History   Tobacco Use   Smoking status: Former    Current packs/day: 0.00    Average packs/day: 1 pack/day for 50.0 years (50.0  ttl pk-yrs)    Types: Cigarettes    Start date: 10/06/1956    Quit date: 10/06/2006    Years since quitting: 17.0   Smokeless tobacco: Never  Substance Use Topics   Alcohol use: Not Currently    Comment: 11/12/2017  might drink a beer once/year     ALLERGIES:   Allergies  Allergen Reactions   Quinapril Shortness Of Breath and Cough     CURRENT MEDICATIONS:   Current Outpatient Medications  Medication Sig Dispense Refill   albuterol  (VENTOLIN  HFA) 108 (90 Base) MCG/ACT inhaler Inhale 2 puffs into the lungs every 6 (six) hours as needed for wheezing or shortness of breath.     amLODipine  (NORVASC ) 5 MG tablet Take 5 mg by mouth daily.     atorvastatin  (LIPITOR ) 40 MG tablet Take 40 mg by mouth daily.     beclomethasone (QVAR  REDIHALER) 40 MCG/ACT inhaler Inhale 2 puffs into the lungs 2 (two) times daily. 1 each 5   carvedilol  (COREG ) 25 MG tablet Take 25 mg by mouth 2 (two) times daily.     Cholecalciferol (VITAMIN D3 PO) Take 1 tablet by mouth daily.     Cyanocobalamin  5000 MCG TBDP Take 5,000 mcg by mouth daily.     finasteride  (PROSCAR ) 5 MG tablet Take 5 mg by mouth at bedtime.  furosemide  (LASIX ) 40 MG tablet Take 40 mg by mouth daily.     levothyroxine  (SYNTHROID ) 75 MCG tablet Take 75 mcg by mouth daily before breakfast.     losartan  (COZAAR ) 100 MG tablet Take 100 mg by mouth daily.     Multiple Vitamin (MULTIVITAMIN) tablet Take 1 tablet daily by mouth.     NON FORMULARY Pt uses a cpap nightly     Omega-3 Fatty Acids (FISH OIL PO) Take 1 capsule by mouth 2 (two) times daily.      oxyCODONE -acetaminophen  (PERCOCET/ROXICET) 5-325 MG tablet Take 1 tablet by mouth every 6 (six) hours as needed for moderate pain. 12 tablet 0   Spacer/Aero-Holding Chambers (AEROCHAMBER MV) inhaler Use as instructed 1 each 0   spironolactone  (ALDACTONE ) 25 MG tablet Take 1 tablet (25 mg total) by mouth daily. 30 tablet 0   tamsulosin  (FLOMAX ) 0.4 MG CAPS capsule Take 0.4 mg by mouth at  bedtime.      ticagrelor  (BRILINTA ) 90 MG TABS tablet Take 1 tablet (90 mg total) by mouth 2 (two) times daily. 60 tablet 3   nitroGLYCERIN  (NITROSTAT ) 0.4 MG SL tablet Place 1 tablet (0.4 mg total) under the tongue every 5 (five) minutes as needed for chest pain. 100 tablet 3   No current facility-administered medications for this visit.    REVIEW OF SYSTEMS:   [X]  denotes positive finding, [ ]  denotes negative finding Cardiac  Comments:  Chest pain or chest pressure:    Shortness of breath upon exertion:    Short of breath when lying flat:    Irregular heart rhythm:        Vascular    Pain in calf, thigh, or hip brought on by ambulation:    Pain in feet at night that wakes you up from your sleep:     Blood clot in your veins:    Leg swelling:         Pulmonary    Oxygen at home:    Productive cough:     Wheezing:         Neurologic    Sudden weakness in arms or legs:     Sudden numbness in arms or legs:     Sudden onset of difficulty speaking or slurred speech:    Temporary loss of vision in one eye:     Problems with dizziness:         Gastrointestinal    Blood in stool:     Vomited blood:         Genitourinary    Burning when urinating:     Blood in urine:        Psychiatric    Major depression:         Hematologic    Bleeding problems:    Problems with blood clotting too easily:        Skin    Rashes or ulcers:        Constitutional    Fever or chills:      PHYSICAL EXAM:   Vitals:   10/19/23 1004  BP: (!) 151/64  Pulse: 63  Resp: 20  Temp: 97.7 F (36.5 C)  SpO2: 97%  Weight: 231 lb 6.4 oz (105 kg)  Height: 5' 6 (1.676 m)    GENERAL: The patient is a well-nourished male, in no acute distress. The vital signs are documented above. CARDIAC: There is a regular rate and rhythm.  VASCULAR: Nonpalpable pedal pulses PULMONARY: Non-labored respirations ABDOMEN: Soft and  non-tender .  MUSCULOSKELETAL: There are no major deformities or  cyanosis. NEUROLOGIC: No focal weakness or paresthesias are detected. SKIN: There are no ulcers or rashes noted. PSYCHIATRIC: The patient has a normal affect.  STUDIES:   I have reviewed his CT scan with the following findings: Abdominal Aorta: The largest aortic diameter has decreased post op  compared to prior exam. Previous diameter measurement was 5.14 x 5.57 cm  obtained on 02/09/2023.    4.88 x 5.37 cm EVAR.      MEDICAL ISSUES:   AAA: Maximum diameter is 5.3 cm which is decreased from prior ultrasound.  There is no evidence of endoleak.  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  He knows to contact me should he develop symptoms or develop a nonhealing wound.    Malvina Serene CLORE, MD, FACS [facility] [phone] Pager [phone]

## 2023-10-27 ENCOUNTER — Other Ambulatory Visit: Payer: Self-pay

## 2023-10-27 DIAGNOSIS — I7143 Infrarenal abdominal aortic aneurysm, without rupture: Secondary | ICD-10-CM

## 2023-12-26 ENCOUNTER — Other Ambulatory Visit: Payer: Self-pay

## 2023-12-26 ENCOUNTER — Ambulatory Visit
Admission: EM | Admit: 2023-12-26 | Discharge: 2023-12-26 | Disposition: A | Discharge status: Home / Self Care | Attending: Internal Medicine | Admitting: Internal Medicine

## 2023-12-26 ENCOUNTER — Ambulatory Visit (INDEPENDENT_AMBULATORY_CARE_PROVIDER_SITE_OTHER)

## 2023-12-26 ENCOUNTER — Encounter: Payer: Self-pay | Admitting: *Deleted

## 2023-12-26 DIAGNOSIS — M79642 Pain in left hand: Secondary | ICD-10-CM | POA: Diagnosis not present

## 2023-12-26 DIAGNOSIS — R2232 Localized swelling, mass and lump, left upper limb: Secondary | ICD-10-CM

## 2023-12-26 MED ORDER — PREDNISONE 20 MG PO TABS: 40.0000 mg | ORAL_TABLET | Freq: Every day | ORAL | 0 refills | Status: AC

## 2023-12-26 NOTE — ED Provider Notes (Signed)
 [facility]    CSN: [id number] Arrival date & time: 12/26/23  1221      History   Chief Complaint Chief Complaint  Patient presents with   Hand Pain    HPI Bryan Carter is a 83 y.o. male.   Patient presents with daughter today.  He reports that he has approximately 2-day history of left hand swelling and pain.  Pain seems to radiate up to elbow.  Denies any injury or any history of chronic pain in this area.  Denies history of gout.  Has taken ibuprofen and Tylenol with minimal improvement in pain.   Hand Pain    Past Medical History:  Diagnosis Date   AAA (abdominal aortic aneurysm) (HCC)    Arthritis    "joints, back" (11/12/2017)   Coronary artery disease    Hyperlipidemia    Hypertension    Hypothyroidism    Myocardial infarction [facility]) 2008   Sleep apnea 2019    Patient Active Problem List   Diagnosis Date Noted   AAA (abdominal aortic aneurysm) (HCC) 02/25/2023   OSA (obstructive sleep apnea) 11/15/2019   DOE (dyspnea on exertion) 11/15/2019   Oliguria and anuria 09/03/2019   Severe sepsis with acute organ dysfunction (HCC) 09/03/2019   Sepsis secondary to UTI (HCC) 09/03/2019   Coronary artery disease 08/31/2019   AKI (acute kidney injury) (HCC) 08/31/2019   Obesity 08/31/2019   Diarrhea 08/31/2019   New-onset angina (HCC) 10/16/2016   AAA (abdominal aortic aneurysm) without rupture (HCC) 08/07/2014   HYPERLIPIDEMIA 10/16/2010   Essential hypertension 10/16/2010   Acute myocardial infarction (HCC) 10/16/2010   HYPERSOMNIA 10/16/2010    Past Surgical History:  Procedure Laterality Date   ABDOMINAL AORTIC ENDOVASCULAR STENT GRAFT N/A 02/25/2023   Procedure: ABDOMINAL AORTIC ENDOVASCULAR STENT GRAFT;  Surgeon: Nada Libman, MD;  Location: [facility] OR;  Service: Vascular;  Laterality: N/A;   ABDOMINAL EXPLORATION SURGERY  ~ 2006   "had to open me up and put mesh in; 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; caused a hernia"   CARDIAC CATHETERIZATION N/A  10/16/2016   Procedure: Left Heart Cath and Coronary Angiography;  Surgeon: Rinaldo Cloud, MD;  Location: [facility] INVASIVE CV LAB;  Service: Cardiovascular;  Laterality: N/A;   CARDIAC CATHETERIZATION N/A 10/16/2016   Procedure: Coronary Stent Intervention;  Surgeon: Rinaldo Cloud, MD;  Location: [facility] INVASIVE CV LAB;  Service: Cardiovascular;  Laterality: N/A;   CATARACT EXTRACTION W/ INTRAOCULAR LENS  IMPLANT, BILATERAL Bilateral    COLON SURGERY  2006   S/P colonoscopy punctures   COLONOSCOPY  2006   "punctured my colon; ended up in hospital"   CORONARY ANGIOGRAPHY N/A 11/12/2017   Procedure: CORONARY ANGIOGRAPHY;  Surgeon: Rinaldo Cloud, MD;  Location: [facility] INVASIVE CV LAB;  Service: Cardiovascular;  Laterality: N/A;   CORONARY ANGIOPLASTY     CORONARY ANGIOPLASTY WITH STENT PLACEMENT  2008   CORONARY BALLOON ANGIOPLASTY N/A 11/12/2017   Procedure: CORONARY BALLOON ANGIOPLASTY;  Surgeon: Rinaldo Cloud, MD;  Location: [facility] INVASIVE CV LAB;  Service: Cardiovascular;  Laterality: N/A;   HERNIA REPAIR  ~ 2006   ULTRASOUND GUIDANCE FOR VASCULAR ACCESS Bilateral 02/25/2023   Procedure: ULTRASOUND GUIDANCE FOR VASCULAR ACCESS;  Surgeon: Nada Libman, MD;  Location: [facility] OR;  Service: Vascular;  Laterality: Bilateral;       Home Medications    Prior to Admission medications   Medication Sig Start Date End Date Taking? Authorizing Provider  predniSONE (DELTASONE) 20 MG tablet Take 2 tablets (40 mg total)  by mouth daily for 5 days. 12/26/23 12/31/23 Yes Leomar Westberg, Acie Fredrickson, FNP  albuterol (VENTOLIN HFA) 108 (90 Base) MCG/ACT inhaler Inhale 2 puffs into the lungs every 6 (six) hours as needed for wheezing or shortness of breath.    [provider]  amLODipine (NORVASC) 5 MG tablet Take 5 mg by mouth daily. 08/09/19   [provider]  atorvastatin (LIPITOR) 40 MG tablet Take 40 mg by mouth daily. 09/07/20   [provider]  beclomethasone (QVAR REDIHALER) 40 MCG/ACT inhaler Inhale 2 puffs  into the lungs 2 (two) times daily. 03/23/23   Coralyn Helling, MD  carvedilol (COREG) 25 MG tablet Take 25 mg by mouth 2 (two) times daily. 08/05/19   [provider]  Cholecalciferol (VITAMIN D3 PO) Take 1 tablet by mouth daily.    [provider]  Cyanocobalamin 5000 MCG TBDP Take 5,000 mcg by mouth daily.    [provider]  finasteride (PROSCAR) 5 MG tablet Take 5 mg by mouth at bedtime.     [provider]  furosemide (LASIX) 40 MG tablet Take 40 mg by mouth daily. 08/19/19   [provider]  levothyroxine (SYNTHROID) 75 MCG tablet Take 75 mcg by mouth daily before breakfast. 12/27/19   [provider]  losartan (COZAAR) 100 MG tablet Take 100 mg by mouth daily.    [provider]  Multiple Vitamin (MULTIVITAMIN) tablet Take 1 tablet daily by mouth.    [provider]  nitroGLYCERIN (NITROSTAT) 0.4 MG SL tablet Place 1 tablet (0.4 mg total) under the tongue every 5 (five) minutes as needed for chest pain. 11/13/17 02/19/23  Rinaldo Cloud, MD  NON FORMULARY Pt uses a cpap nightly    [provider]  Omega-3 Fatty Acids (FISH OIL PO) Take 1 capsule by mouth 2 (two) times daily.     [provider]  oxyCODONE-acetaminophen (PERCOCET/ROXICET) 5-325 MG tablet Take 1 tablet by mouth every 6 (six) hours as needed for moderate pain. 02/26/23   Lars Mage, PA-C  Spacer/Aero-Holding Chambers (AEROCHAMBER MV) inhaler Use as instructed 04/08/23   Coralyn Helling, MD  spironolactone (ALDACTONE) 25 MG tablet Take 1 tablet (25 mg total) by mouth daily. 09/06/19   Dhungel, Theda Belfast, MD  tamsulosin (FLOMAX) 0.4 MG CAPS capsule Take 0.4 mg by mouth at bedtime.     [provider]  ticagrelor (BRILINTA) 90 MG TABS tablet Take 1 tablet (90 mg total) by mouth 2 (two) times daily. 10/17/16   Rinaldo Cloud, MD    Family History Family History  Problem Relation Age of Onset   Heart attack Mother    Heart disease Mother     Heart attack Father    Heart disease Father     Social History Social History   Tobacco Use   Smoking status: Former    Current packs/day: 0.00    Average packs/day: 1 pack/day for 50.0 years (50.0 ttl pk-yrs)    Types: Cigarettes    Start date: 10/06/1956    Quit date: 10/06/2006    Years since quitting: 17.2   Smokeless tobacco: Never  Vaping Use   Vaping status: Never Used  Substance Use Topics   Alcohol use: Not Currently    Comment: 11/12/2017  "might drink a beer once/year"   Drug use: Yes    Comment: pt denies hx of drug use     Allergies   Quinapril   Review of Systems Review of Systems Per HPI  Physical Exam Triage  Vital Signs ED Triage Vitals  Encounter Vitals Group     BP 12/26/23 1249 100/64     Systolic BP Percentile --      Diastolic BP Percentile --      Pulse Rate 12/26/23 1249 76     Resp 12/26/23 1249 20     Temp 12/26/23 1249 98.5 F (36.9 C)     Temp Source 12/26/23 1249 Oral     SpO2 12/26/23 1249 97 %     Weight --      Height --      Head Circumference --      Peak Flow --      Pain Score 12/26/23 1245 8     Pain Loc --      Pain Education --      Exclude from Growth Chart --    No data found.  Updated Vital Signs BP 119/72 (BP Location: Right Arm)   Pulse 76   Temp 98.5 F (36.9 C) (Oral)   Resp 20   SpO2 97%   Visual Acuity Right Eye Distance:   Left Eye Distance:   Bilateral Distance:    Right Eye Near:   Left Eye Near:    Bilateral Near:     Physical Exam Constitutional:      General: He is not in acute distress.    Appearance: Normal appearance. He is not toxic-appearing or diaphoretic.  HENT:     Head: Normocephalic and atraumatic.  Eyes:     Extraocular Movements: Extraocular movements intact.     Conjunctiva/sclera: Conjunctivae normal.  Pulmonary:     Effort: Pulmonary effort is normal.  Musculoskeletal:     Comments: Patient has diffuse swelling throughout left hand and wrist.  Mild erythema noted.  No  warmth.  Patient is able to have full range of motion of hand with the exception of swelling.  Swelling does not seem to extend up the arm.  Capillary refill and pulses are intact.  Neurological:     General: No focal deficit present.     Mental Status: He is alert and oriented to person, place, and time. Mental status is at baseline.  Psychiatric:        Mood and Affect: Mood normal.        Behavior: Behavior normal.        Thought Content: Thought content normal.        Judgment: Judgment normal.      UC Treatments / Results  Labs (all labs ordered are listed, but only abnormal results are displayed) Labs Reviewed - No data to display  EKG   Radiology DG Wrist Complete Left Result Date: 12/26/2023 CLINICAL DATA:  Soft tissue swelling in the hand and wrist, no trauma EXAM: LEFT WRIST - COMPLETE 3+ VIEW COMPARISON:  None Available. FINDINGS: As noted on hand radiographs there is substantial chondrocalcinosis along the joint capsule of the wrist and in the TFCC disc raising the possibility of CPPD arthropathy. Given the amorphous calcifications along the volar and plantar wrist, the possibility of osteochondromatosis of the wrist is raised. There does appear to be some subcutaneous edema along the wrist and tracking up into the hand. No fracture or acute bony findings. IMPRESSION: 1. 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 Given the amorphous calcifications along the volar and plantar wrist, the possibility of osteochondromatosis of the wrist is raised. 2. Subcutaneous edema along the  wrist and tracking up into the hand. Please see differential diagnostic considerations in hand radiograph report. Electronically Signed   By: Gaylyn Rong M.D.   On: 12/26/2023 13:57   DG Hand Complete Left Result Date: 12/26/2023 CLINICAL DATA:  Left hand swelling starting 2 days ago, without injury. EXAM: LEFT  HAND - COMPLETE 3+ VIEW COMPARISON:  None Available. FINDINGS: Chondrocalcinosis the TFCC disc with scattered amorphous calcifications distal to the radial styloid and along the volar and dorsal carpus favoring chondrocalcinosis and reason the possibility of CPPD arthropathy. Diffuse soft tissue swelling in the hand noted. No definite subperiosteal bone resorption. IMPRESSION: 1. Diffuse soft tissue swelling in the hand. Reportedly this occurred spontaneously, with possibilities potentially including infection, vascular causes, or complex regional pain syndrome. 2. 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 Electronically Signed   By: Gaylyn Rong M.D.   On: 12/26/2023 13:55    Procedures Procedures (including critical care time)  Medications Ordered in UC Medications - No data to display  Initial Impression / Assessment and Plan / UC Course  I have reviewed the triage vital signs and the nursing notes.  Pertinent labs & imaging results that were available during my care of the patient were reviewed by me and considered in my medical decision making (see chart for details).     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 Discussed this with daughter and patient.  No obvious acute abnormality noted.  I do not have any concern for DVT or worrisome etiology that needs emergency department evaluation. Discussed patient's clinical course and images with supervising physician Dr. Tracie Harrier. Will treat with short course of prednisone to see if this will help decrease inflammation.  Patient was encouraged to follow-up with hand specialist at provided contact as soon as possible.  Patient verbalized understanding and was agreeable with plan. Final Clinical Impressions(s) / UC Diagnoses   Final diagnoses:  Left hand pain  Localized swelling on left hand     Discharge Instructions      I will call if x-ray is  abnormal.  I have prescribed prednisone to decrease inflammation.  Follow-up if any symptoms persist or worsen.     ED Prescriptions     Medication Sig Dispense Auth. Provider   predniSONE (DELTASONE) 20 MG tablet Take 2 tablets (40 mg total) by mouth daily for 5 days. 10 tablet Gustavus Bryant, [place]      PDMP not reviewed this encounter.   Gustavus Bryant, [place] 12/26/23 [phone]

## 2023-12-26 NOTE — Discharge Instructions (Addendum)
 I will call if x-ray is abnormal.  I have prescribed prednisone to decrease inflammation.  Follow-up if any symptoms persist or worsen.

## 2023-12-26 NOTE — ED Triage Notes (Addendum)
 Pt reports he woke up with left hand swelling 2 days ago. Denies injury. Right hand and wrist are markedly swollen, with swelling extending into left forearm. Painful to touch. Pt reports difficulty lifting L arm due to pain. Denies hx of gout

## 2024-01-28 ENCOUNTER — Ambulatory Visit: Payer: Medicare Other | Admitting: Cardiology
